# Patient Record
Sex: Female | Born: 1950 | Race: White | Hispanic: Yes | Smoking: Never smoker
Health system: Southern US, Community
[De-identification: ages and names within clinical notes are randomized; demographics above are authoritative.]

## PROBLEM LIST (undated history)

## (undated) DIAGNOSIS — J45909 Unspecified asthma, uncomplicated: Secondary | ICD-10-CM

## (undated) DIAGNOSIS — E785 Hyperlipidemia, unspecified: Secondary | ICD-10-CM

## (undated) DIAGNOSIS — M797 Fibromyalgia: Secondary | ICD-10-CM

## (undated) DIAGNOSIS — K5792 Diverticulitis of intestine, part unspecified, without perforation or abscess without bleeding: Secondary | ICD-10-CM

## (undated) DIAGNOSIS — I2510 Atherosclerotic heart disease of native coronary artery without angina pectoris: Secondary | ICD-10-CM

## (undated) DIAGNOSIS — E119 Type 2 diabetes mellitus without complications: Secondary | ICD-10-CM

## (undated) DIAGNOSIS — I10 Essential (primary) hypertension: Secondary | ICD-10-CM

## (undated) DIAGNOSIS — M1990 Unspecified osteoarthritis, unspecified site: Secondary | ICD-10-CM

## (undated) DIAGNOSIS — R0681 Apnea, not elsewhere classified: Secondary | ICD-10-CM

## (undated) HISTORY — DX: Atherosclerotic heart disease of native coronary artery without angina pectoris: I25.10

## (undated) HISTORY — PX: CHOLECYSTECTOMY: SHX55

## (undated) HISTORY — PX: APPENDECTOMY: SHX54

## (undated) HISTORY — DX: Hyperlipidemia, unspecified: E78.5

## (undated) HISTORY — PX: WISDOM TOOTH EXTRACTION: SHX21

## (undated) HISTORY — PX: BREAST BIOPSY: SHX20

## (undated) HISTORY — PX: HERNIA REPAIR: SHX51

## (undated) HISTORY — PX: ABDOMINAL SURGERY: SHX537

## (undated) HISTORY — PX: SINUS SURGERY WITH INSTATRAK: SHX5215

## (undated) HISTORY — PX: ABDOMINAL HYSTERECTOMY: SHX81

---

## 2016-07-15 ENCOUNTER — Encounter (HOSPITAL_BASED_OUTPATIENT_CLINIC_OR_DEPARTMENT_OTHER): Payer: Self-pay

## 2016-07-15 ENCOUNTER — Emergency Department (HOSPITAL_BASED_OUTPATIENT_CLINIC_OR_DEPARTMENT_OTHER)
Admission: EM | Admit: 2016-07-15 | Discharge: 2016-07-15 | Disposition: A | Discharge status: Home / Self Care | Payer: PRIVATE HEALTH INSURANCE | Attending: Physician Assistant | Admitting: Physician Assistant

## 2016-07-15 ENCOUNTER — Emergency Department (HOSPITAL_BASED_OUTPATIENT_CLINIC_OR_DEPARTMENT_OTHER): Payer: PRIVATE HEALTH INSURANCE

## 2016-07-15 DIAGNOSIS — I10 Essential (primary) hypertension: Secondary | ICD-10-CM | POA: Insufficient documentation

## 2016-07-15 DIAGNOSIS — G500 Trigeminal neuralgia: Secondary | ICD-10-CM | POA: Insufficient documentation

## 2016-07-15 DIAGNOSIS — J45909 Unspecified asthma, uncomplicated: Secondary | ICD-10-CM | POA: Insufficient documentation

## 2016-07-15 DIAGNOSIS — Z7982 Long term (current) use of aspirin: Secondary | ICD-10-CM | POA: Diagnosis not present

## 2016-07-15 DIAGNOSIS — E119 Type 2 diabetes mellitus without complications: Secondary | ICD-10-CM | POA: Insufficient documentation

## 2016-07-15 DIAGNOSIS — Z7984 Long term (current) use of oral hypoglycemic drugs: Secondary | ICD-10-CM | POA: Insufficient documentation

## 2016-07-15 DIAGNOSIS — Z79899 Other long term (current) drug therapy: Secondary | ICD-10-CM | POA: Insufficient documentation

## 2016-07-15 DIAGNOSIS — R51 Headache: Secondary | ICD-10-CM | POA: Diagnosis present

## 2016-07-15 HISTORY — DX: Unspecified osteoarthritis, unspecified site: M19.90

## 2016-07-15 HISTORY — DX: Fibromyalgia: M79.7

## 2016-07-15 HISTORY — DX: Type 2 diabetes mellitus without complications: E11.9

## 2016-07-15 HISTORY — DX: Apnea, not elsewhere classified: R06.81

## 2016-07-15 HISTORY — DX: Essential (primary) hypertension: I10

## 2016-07-15 HISTORY — DX: Unspecified asthma, uncomplicated: J45.909

## 2016-07-15 HISTORY — DX: Diverticulitis of intestine, part unspecified, without perforation or abscess without bleeding: K57.92

## 2016-07-15 MED ORDER — CARBAMAZEPINE ER 100 MG PO TB12: 100.0000 mg | ORAL_TABLET | Freq: Two times a day (BID) | ORAL | 0 refills | Status: DC

## 2016-07-15 NOTE — ED Triage Notes (Signed)
C/o pain to right side of face-started 1 week ago with pain to right upper and lower teeth-NAD-steady gait
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
## 2016-07-15 NOTE — ED Notes (Signed)
Pt denies fever, nasal congestion, or cough

## 2016-07-15 NOTE — Discharge Instructions (Addendum)
We are unsure what is causing your pain today. We think it may be due to trigeminal neuralgia. We've given you information to read about this and the prescription. Please follow-up with a primary care physician as soon as he can.

## 2018-08-10 HISTORY — PX: BARIATRIC SURGERY: SHX1103

## 2018-09-26 ENCOUNTER — Encounter: Payer: Self-pay | Admitting: Internal Medicine

## 2018-09-26 ENCOUNTER — Ambulatory Visit (INDEPENDENT_AMBULATORY_CARE_PROVIDER_SITE_OTHER): Payer: Medicare Other | Admitting: Internal Medicine

## 2018-09-26 VITALS — BP 122/72 | HR 69 | Ht 60.0 in | Wt 214.8 lb

## 2018-09-26 DIAGNOSIS — J4540 Moderate persistent asthma, uncomplicated: Secondary | ICD-10-CM | POA: Diagnosis not present

## 2018-09-26 DIAGNOSIS — R059 Cough, unspecified: Secondary | ICD-10-CM

## 2018-09-26 DIAGNOSIS — R0602 Shortness of breath: Secondary | ICD-10-CM

## 2018-09-26 DIAGNOSIS — R05 Cough: Secondary | ICD-10-CM | POA: Diagnosis not present

## 2018-09-26 DIAGNOSIS — R0609 Other forms of dyspnea: Secondary | ICD-10-CM | POA: Diagnosis not present

## 2018-09-26 DIAGNOSIS — R0982 Postnasal drip: Secondary | ICD-10-CM

## 2018-09-26 LAB — CBC WITH DIFFERENTIAL/PLATELET
Basophils Absolute: 0.1 10*3/uL (ref 0.0–0.1)
Basophils Relative: 0.8 % (ref 0.0–3.0)
EOS PCT: 1.7 % (ref 0.0–5.0)
Eosinophils Absolute: 0.1 10*3/uL (ref 0.0–0.7)
HCT: 41.6 % (ref 36.0–46.0)
HEMOGLOBIN: 13.6 g/dL (ref 12.0–15.0)
Lymphocytes Relative: 25.1 % (ref 12.0–46.0)
Lymphs Abs: 2 10*3/uL (ref 0.7–4.0)
MCHC: 32.7 g/dL (ref 30.0–36.0)
MCV: 88.5 fl (ref 78.0–100.0)
MONO ABS: 0.6 10*3/uL (ref 0.1–1.0)
MONOS PCT: 6.9 % (ref 3.0–12.0)
NEUTROS PCT: 65.5 % (ref 43.0–77.0)
Neutro Abs: 5.3 10*3/uL (ref 1.4–7.7)
Platelets: 270 10*3/uL (ref 150.0–400.0)
RBC: 4.7 Mil/uL (ref 3.87–5.11)
RDW: 15.3 % (ref 11.5–15.5)
WBC: 8.1 10*3/uL (ref 4.0–10.5)

## 2018-09-26 LAB — NITRIC OXIDE: NITRIC OXIDE: 10

## 2018-09-26 MED ORDER — ALBUTEROL SULFATE HFA 108 (90 BASE) MCG/ACT IN AERS: 2.0000 | INHALATION_SPRAY | Freq: Four times a day (QID) | RESPIRATORY_TRACT | 6 refills | Status: DC | PRN

## 2018-09-26 MED ORDER — FLUTICASONE PROPIONATE 50 MCG/ACT NA SUSP: 2.0000 | Freq: Every day | NASAL | 2 refills | Status: DC

## 2018-09-26 NOTE — Patient Instructions (Addendum)
ICD-10-CM   1. Moderate persistent asthma, unspecified whether complicated J45.40   2. Cough R05 CANCELED: CT Chest High Resolution    CANCELED: Pulmonary function test    CANCELED: CBC w/Diff  3. Post-nasal drip R09.82   4. Dyspnea on exertion R06.09     Symptoms currently appear well controlled  Plan  - continue symbicort 160 dose, 2 puff twice daily  -Continue Singulair daily -Change Xopenex to albuterol as needed [Xopenex is extremely expensive in the [place]] -  take generic fluticasone inhaler 2 squirts each nostril daily  -Take prescription for all the above medications -Flu shot high-dose today if you have not had it so far this season  -Do high-resolution CT chest supine and prone -next few to several weeks -Do full pulmonary function test-Next few to several weeks -Do CBC with differential count and blood IgE level -today September 26, 2018  Followup   -Next few to several weeks to review results
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
## 2018-09-27 LAB — IGE: IgE (Immunoglobulin E), Serum: 76 kU/L (ref <=114)

## 2018-10-06 ENCOUNTER — Ambulatory Visit: Admission: RE | Admit: 2018-10-06 | Payer: Medicare Other | Source: Ambulatory Visit

## 2018-10-06 ENCOUNTER — Encounter: Payer: Self-pay | Admitting: Pulmonary Disease

## 2018-10-06 ENCOUNTER — Ambulatory Visit (INDEPENDENT_AMBULATORY_CARE_PROVIDER_SITE_OTHER): Payer: Medicare Other | Admitting: Pulmonary Disease

## 2018-10-06 VITALS — BP 140/80 | HR 72 | Ht 60.0 in | Wt 216.8 lb

## 2018-10-06 DIAGNOSIS — G4733 Obstructive sleep apnea (adult) (pediatric): Secondary | ICD-10-CM

## 2018-10-06 DIAGNOSIS — J4540 Moderate persistent asthma, uncomplicated: Secondary | ICD-10-CM | POA: Diagnosis not present

## 2018-10-06 DIAGNOSIS — Z9989 Dependence on other enabling machines and devices: Secondary | ICD-10-CM | POA: Diagnosis not present

## 2018-10-06 NOTE — Progress Notes (Signed)
Carol Gilbert    [id number]    May 04, 1951  Primary Care Physician:Patient, No Pcp Per  Referring Physician: No referring provider defined for this encounter.  Chief complaint:   Patient with a history of obstructive sleep apnea diagnosed in 2006 Recent bariatric surgery with significant weight loss Intolerance to current pressures  HPI:  She was diagnosed about 2006 and has been on CPAP since then She had bariatric surgery December 11 of 2019 During the process of evaluation and post surgery she has lost over 70 pounds  Pressure feels a little bit too much at present and it starting to affect her sleep quality  She was not having any problems with her CPAP until recently Has been very compliant with CPAP use  Usually tries to go to bed about 10 PM Wakes up a couple of times to use the bathroom, without CPAP usually wakes up almost hourly Final awakening time about 8 AM Has been having headaches Dryness of the mouth since has significant weight loss  Comorbidities include asthma, fibromyalgia, hypertension, diabetes, scoliosis, neuropathy  Does not smoke  Outpatient Encounter Medications as of 10/06/2018  Medication Sig  . albuterol (PROVENTIL HFA;VENTOLIN HFA) 108 (90 Base) MCG/ACT inhaler Inhale 2 puffs into the lungs every 6 (six) hours as needed for wheezing or shortness of breath.  Marland Kitchen atorvastatin (LIPITOR) 10 MG tablet Take by mouth.  . Cholecalciferol (VITAMIN D) 125 MCG (5000 UT) CAPS Take 5,000 Units by mouth daily.   . DULoxetine HCl (CYMBALTA PO) Take by mouth.  . fluticasone (FLONASE) 50 MCG/ACT nasal spray Place 2 sprays into both nostrils daily.  . Guaifenesin 1200 MG TB12 Take by mouth.  . montelukast (SINGULAIR) 10 MG tablet Take 10 mg by mouth at bedtime.  . Multiple Vitamin (MULTIVITAMIN) tablet Take by mouth.  . sennosides-docusate sodium (SENOKOT-S) 8.6-50 MG tablet Take by mouth.  . SYMBICORT 160-4.5 MCG/ACT inhaler INL 2 PFS PO BID   . TRAMADOL HCL ER PO Take by mouth.   No facility-administered encounter medications on file as of 10/06/2018.     Allergies as of 10/06/2018 - Review Complete 10/06/2018  Allergen Reaction Noted  . Shellfish allergy  07/15/2016    Past Medical History:  Diagnosis Date  . Apnea   . Arthritis   . Asthma   . Diabetes mellitus without complication (HCC)   . Diverticulitis   . Fibromyalgia   . Hypertension     Past Surgical History:  Procedure Laterality Date  . ABDOMINAL HYSTERECTOMY    . ABDOMINAL SURGERY    . APPENDECTOMY    . BREAST BIOPSY    . CESAREAN SECTION    . CHOLECYSTECTOMY    . HERNIA REPAIR    . SINUS SURGERY WITH INSTATRAK    . WISDOM TOOTH EXTRACTION      History reviewed. No pertinent family history.  Social History   Socioeconomic History  . Marital status: Divorced    Spouse name: Not on file  . Number of children: Not on file  . Years of education: Not on file  . Highest education level: Not on file  Occupational History  . Not on file  Social Needs  . Financial resource strain: Not on file  . Food insecurity:    Worry: Not on file    Inability: Not on file  . Transportation needs:    Medical: Not on file    Non-medical: Not on file  Tobacco Use  .  Smoking status: Never Smoker  . Smokeless tobacco: Never Used  Substance and Sexual Activity  . Alcohol use: No  . Drug use: No  . Sexual activity: Not on file  Lifestyle  . Physical activity:    Days per week: Not on file    Minutes per session: Not on file  . Stress: Not on file  Relationships  . Social connections:    Talks on phone: Not on file    Gets together: Not on file    Attends religious service: Not on file    Active member of club or organization: Not on file    Attends meetings of clubs or organizations: Not on file    Relationship status: Not on file  . Intimate partner violence:    Fear of current or ex partner: Not on file    Emotionally abused: Not on file     Physically abused: Not on file    Forced sexual activity: Not on file  Other Topics Concern  . Not on file  Social History Narrative  . Not on file    Review of Systems  Constitutional: Negative.   HENT: Negative.   Eyes: Negative.   Respiratory: Negative for cough and shortness of breath.   Cardiovascular: Negative.   Gastrointestinal: Negative.   All other systems reviewed and are negative.   Vitals:   10/06/18 1108  BP: 140/80  Pulse: 72  SpO2: 97%     Physical Exam  Constitutional: She appears well-developed and well-nourished.  HENT:  Head: Normocephalic and atraumatic.  Eyes: Pupils are equal, round, and reactive to light. Conjunctivae and EOM are normal. Right eye exhibits no discharge. Left eye exhibits no discharge.  Neck: Neck supple. No JVD present. No tracheal deviation present. No thyromegaly present.  Cardiovascular: Normal rate and regular rhythm.  Pulmonary/Chest: Effort normal and breath sounds normal. No stridor. No respiratory distress. She has no wheezes. She has no rales. She exhibits no tenderness.  Abdominal: Soft. Bowel sounds are normal. She exhibits no distension. There is no abdominal tenderness.    Results of the Epworth flowsheet 10/06/2018  Sitting and reading 0  Watching TV 0  Sitting, inactive in a public place (e.g. a theatre or a meeting) 0  As a passenger in a car for an hour without a break 1  Lying down to rest in the afternoon when circumstances permit 3  Sitting and talking to someone 0  Sitting quietly after a lunch without alcohol 0  In a car, while stopped for a few minutes in traffic 0  Total score 4   Assessment:  .  Obstructive sleep apnea -Diagnosed in 2006, has been very compliant with treatment -Recent intolerance related to continuing weight loss -Sleep is getting disrupted by dryness and headaches  .  Morbid obesity -Has managed to lose 70 pounds and is continuing to lose weight  .  CPAP intolerance -Related to  continuing weight loss   Plan/Recommendations:  .  We will set the patient up for a home sleep study  .  Pressure intolerance is related to continue weight loss  .  Will benefit from being set up with an auto titrating machine as she continues to lose weight  .  I will see her back in the office in about 3 months Encouraged to continue to work on weight loss efforts Encouraged to call if any significant concerns  Virl Diamond MD [facility] 10/06/2018, 11:18 AM  CC: No  ref. provider found

## 2018-10-06 NOTE — Patient Instructions (Signed)
History of obstructive sleep apnea Recent bariatric surgery with significant weight loss  We will set you up for a home sleep study We will give you a call once we have results from the study and set you up with a medical supply company  I will see you back in the office in about 3 months Call with any significant concerns   Sleep Apnea Sleep apnea is a condition in which breathing pauses or becomes shallow during sleep. Episodes of sleep apnea usually last 10 seconds or longer, and they may occur as many as 20 times an hour. Sleep apnea disrupts your sleep and keeps your body from getting the rest that it needs. This condition can increase your risk of certain health problems, including:  Heart attack.  Stroke.  Obesity.  Diabetes.  Heart failure.  Irregular heartbeat. There are three kinds of sleep apnea:  Obstructive sleep apnea. This kind is caused by a blocked or collapsed airway.  Central sleep apnea. This kind happens when the part of the brain that controls breathing does not send the correct signals to the muscles that control breathing.  Mixed sleep apnea. This is a combination of obstructive and central sleep apnea. What are the causes? The most common cause of this condition is a collapsed or blocked airway. An airway can collapse or become blocked if:  Your throat muscles are abnormally relaxed.  Your tongue and tonsils are larger than normal.  You are overweight.  Your airway is smaller than normal. What increases the risk? This condition is more likely to develop in people who:  Are overweight.  Smoke.  Have a smaller than normal airway.  Are elderly.  Are female.  Drink alcohol.  Take sedatives or tranquilizers.  Have a family history of sleep apnea. What are the signs or symptoms? Symptoms of this condition include:  Trouble staying asleep.  Daytime sleepiness and tiredness.  Irritability.  Loud snoring.  Morning  headaches.  Trouble concentrating.  Forgetfulness.  Decreased interest in sex.  Unexplained sleepiness.  Mood swings.  Personality changes.  Feelings of depression.  Waking up often during the night to urinate.  Dry mouth.  Sore throat. How is this diagnosed? This condition may be diagnosed with:  A medical history.  A physical exam.  A series of tests that are done while you are sleeping (sleep study). These tests are usually done in a sleep lab, but they may also be done at home. How is this treated? Treatment for this condition aims to restore normal breathing and to ease symptoms during sleep. It may involve managing health issues that can affect breathing, such as high blood pressure or obesity. Treatment may include:  Sleeping on your side.  Using a decongestant if you have nasal congestion.  Avoiding the use of depressants, including alcohol, sedatives, and narcotics.  Losing weight if you are overweight.  Making changes to your diet.  Quitting smoking.  Using a device to open your airway while you sleep, such as: ? An oral appliance. This is a custom-made mouthpiece that shifts your lower jaw forward. ? A continuous positive airway pressure (CPAP) device. This device delivers oxygen to your airway through a mask. ? A nasal expiratory positive airway pressure (EPAP) device. This device has valves that you put into each nostril. ? A bi-level positive airway pressure (BPAP) device. This device delivers oxygen to your airway through a mask.  Surgery if other treatments do not work. During surgery, excess tissue is removed  to create a wider airway. It is important to get treatment for sleep apnea. Without treatment, this condition can lead to:  High blood pressure.  Coronary artery disease.  (Men) An inability to achieve or maintain an erection (impotence).  Reduced thinking abilities. Follow these instructions at home:  Make any lifestyle changes that  your health care provider recommends.  Eat a healthy, well-balanced diet.  Take over-the-counter and prescription medicines only as told by your health care provider.  Avoid using depressants, including alcohol, sedatives, and narcotics.  Take steps to lose weight if you are overweight.  If you were given a device to open your airway while you sleep, use it only as told by your health care provider.  Do not use any tobacco products, such as cigarettes, chewing tobacco, and e-cigarettes. If you need help quitting, ask your health care provider.  Keep all follow-up visits as told by your health care provider. This is important. Contact a health care provider if:  The device that you received to open your airway during sleep is uncomfortable or does not seem to be working.  Your symptoms do not improve.  Your symptoms get worse. Get help right away if:  You develop chest pain.  You develop shortness of breath.  You develop discomfort in your back, arms, or stomach.  You have trouble speaking.  You have weakness on one side of your body.  You have drooping in your face. These symptoms may represent a serious problem that is an emergency. Do not wait to see if the symptoms will go away. Get medical help right away. Call your local emergency services (911 in the [place] Do not drive yourself to the hospital. This information is not intended to replace advice given to you by your health care provider. Make sure you discuss any questions you have with your health care provider. Document Released: 08/07/2002 Document Revised: 03/15/2017 Document Reviewed: 05/27/2015 Elsevier Interactive Patient Education  2019 [employer].

## 2018-10-24 ENCOUNTER — Ambulatory Visit (INDEPENDENT_AMBULATORY_CARE_PROVIDER_SITE_OTHER)
Admission: RE | Admit: 2018-10-24 | Discharge: 2018-10-24 | Disposition: A | Discharge status: Home / Self Care | Payer: Medicare Other | Source: Ambulatory Visit | Attending: Internal Medicine | Admitting: Internal Medicine

## 2018-10-24 DIAGNOSIS — R0602 Shortness of breath: Secondary | ICD-10-CM

## 2018-10-25 DIAGNOSIS — Z9989 Dependence on other enabling machines and devices: Principal | ICD-10-CM

## 2018-10-25 DIAGNOSIS — G4733 Obstructive sleep apnea (adult) (pediatric): Secondary | ICD-10-CM | POA: Diagnosis not present

## 2018-10-28 DIAGNOSIS — G4733 Obstructive sleep apnea (adult) (pediatric): Secondary | ICD-10-CM

## 2018-11-01 ENCOUNTER — Ambulatory Visit (INDEPENDENT_AMBULATORY_CARE_PROVIDER_SITE_OTHER): Payer: Medicare Other | Admitting: Internal Medicine

## 2018-11-01 ENCOUNTER — Telehealth: Payer: Self-pay | Admitting: Internal Medicine

## 2018-11-01 ENCOUNTER — Telehealth: Payer: Self-pay | Admitting: Pulmonary Disease

## 2018-11-01 ENCOUNTER — Encounter: Payer: Self-pay | Admitting: Internal Medicine

## 2018-11-01 VITALS — BP 128/72 | HR 69 | Ht 61.0 in | Wt 206.0 lb

## 2018-11-01 DIAGNOSIS — R0982 Postnasal drip: Secondary | ICD-10-CM | POA: Diagnosis not present

## 2018-11-01 DIAGNOSIS — J4540 Moderate persistent asthma, uncomplicated: Secondary | ICD-10-CM | POA: Diagnosis not present

## 2018-11-01 DIAGNOSIS — Z9989 Dependence on other enabling machines and devices: Secondary | ICD-10-CM

## 2018-11-01 DIAGNOSIS — G4733 Obstructive sleep apnea (adult) (pediatric): Secondary | ICD-10-CM | POA: Diagnosis not present

## 2018-11-01 DIAGNOSIS — R911 Solitary pulmonary nodule: Secondary | ICD-10-CM

## 2018-11-01 LAB — PULMONARY FUNCTION TEST
DL/VA % pred: 135 %
DL/VA: 5.78 ml/min/mmHg/L
DLCO UNC: 24.98 ml/min/mmHg
DLCO cor % pred: 139 %
DLCO cor: 24.83 ml/min/mmHg
DLCO unc % pred: 140 %
FEF 25-75 POST: 1.12 L/s
FEF 25-75 Pre: 1.25 L/sec
FEF2575-%Change-Post: -10 %
FEF2575-%PRED-PRE: 65 %
FEF2575-%Pred-Post: 58 %
FEV1-%Change-Post: -2 %
FEV1-%Pred-Post: 79 %
FEV1-%Pred-Pre: 81 %
FEV1-POST: 1.67 L
FEV1-Pre: 1.72 L
FEV1FVC-%CHANGE-POST: -3 %
FEV1FVC-%PRED-PRE: 94 %
FEV6-%CHANGE-POST: 1 %
FEV6-%PRED-PRE: 88 %
FEV6-%Pred-Post: 89 %
FEV6-Post: 2.38 L
FEV6-Pre: 2.35 L
FEV6FVC-%Pred-Post: 104 %
FEV6FVC-%Pred-Pre: 104 %
FVC-%CHANGE-POST: 0 %
FVC-%PRED-POST: 85 %
FVC-%Pred-Pre: 85 %
FVC-Post: 2.38 L
FVC-Pre: 2.36 L
POST FEV6/FVC RATIO: 100 %
PRE FEV1/FVC RATIO: 73 %
Post FEV1/FVC ratio: 70 %
Pre FEV6/FVC Ratio: 100 %

## 2018-11-01 NOTE — Patient Instructions (Addendum)
ICD-10-CM   1. Moderate persistent asthma, unspecified whether complicated J45.40   2. Post-nasal drip R09.82   3. OSA on CPAP G47.33    Z99.89    Moderate persistent asthma, unspecified whether complicated  -Stable and well controlled. -I would classify as moderate persistent asthma with the Symbicort and Singulair giving you normal lung function. -No evidence of high eosinophil of blood allergy IgE marker -indicating overall good prognosis -Monitor for flareups - Please talk to PCP Patient, No Pcp Per -  and ensure you get  shingrix (GSK) inactivated vaccine against shingles -Always get your flu shot every season and pneumonia shot per schedule  Post-nasal drip  -Continue nasal steroid and nasal wash  OSA on CPAP -Your next appointment Dr. Aldean Ast is Jan 06, 2019.    Followu[ - Dr Marchelle Gearing 6 months or sooner

## 2018-11-01 NOTE — Telephone Encounter (Signed)
Pt seen for a follow up with Dr. Marchelle Gearing. At the visit, pt stated to MR that she had sleep consult with Dr. Wynona Neat and had sleep study performed around 10/27/2018.   Pt is wanting to know the results of HST as soon as we have them. Per MR, send phone note in regards to this. Dr. Wynona Neat, please advise on results.   Sending to both [facility] and Dr. Wynona Neat to follow up on.

## 2018-11-01 NOTE — Telephone Encounter (Signed)
CT chest  - forgot to give these results in clinic and my sincere apologes   -> No cancer, No fibrosis, No pneumnia. No emphysema  -> 24mm RLL nodule -> followup needed 6 months  - aorta 4cm - needs annual CT which we can capture with nodule followup  gavie resutls to her - called her back to clinic  PLAN Irving Burton - please get ct chest without contrast at folowup in 6 months  Thanks  MR    IMPRESSION:10/24/2018 1. No evidence of interstitial lung disease. 2. Mild patchy air trapping in both lungs, indicative of small airways disease. 3. Subsolid 7 mm right lower lobe pulmonary nodule. Follow-up non-contrast CT recommended at 3-6 months to confirm persistence. If unchanged, and solid component remains <6 mm, annual CT is recommended until 5 years of stability has been established. If persistent these nodules should be considered highly suspicious if the solid component of the nodule is 6 mm or greater in size and enlarging. This recommendation follows the consensus statement: Guidelines for Management of Incidental Pulmonary Nodules Detected on CT Images: From the Fleischner Society 2017; Radiology 2017; 284:228-243. 4. Left adrenal adenoma. 5. Ectatic 4.0 cm ascending thoracic aorta. Recommend annual imaging followup by CTA or MRA. This recommendation follows 2010 ACCF/AHA/AATS/ACR/ASA/SCA/SCAI/SIR/STS/SVM Guidelines for the Diagnosis and Management of Patients with Thoracic Aortic Disease. Circulation. 2010; 121: N277-O242. Aortic aneurysm NOS (ICD10-I71.9).  Aortic Atherosclerosis (ICD10-I70.0).

## 2018-11-01 NOTE — Telephone Encounter (Signed)
Order has been placed for the CT. Nothing further needed. 

## 2018-11-01 NOTE — Progress Notes (Signed)
PFT done today. 
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
## 2018-11-04 NOTE — Telephone Encounter (Signed)
Dr. Wynona Neat has reviewed the home sleep test this showed MILD osa .   Recommendations  Sleep position optimization by encouraging sleep in the lateral poittion , eveating the head of the bed by 30 degrees may help. Cpap therapy maybe consider if the patient has significant daytime sympotoms  That can be ascribed to  An effect of sleep disordered breathing    Treatment options maybe CPAP with the settings auto 5 to 15.          Regular exercise will help promote good quality sleep  .    Advise against driving while sleepy & against medication with sedative side effects.      Make appointment for 3 months for compliance with download with Dr. Wynona Neat. If patient choose a cpap therapy. Other wise as needed.  Patient would like to try sleep postioning and weight loss first before cpap. Will call back for apt.

## 2019-01-06 ENCOUNTER — Ambulatory Visit: Payer: Medicare Other | Admitting: Pulmonary Disease

## 2019-02-16 ENCOUNTER — Telehealth: Payer: Self-pay | Admitting: Internal Medicine

## 2019-02-16 NOTE — Telephone Encounter (Signed)
Called and spoke with pt letting her know that we received the information that surgical clearance was needed. Asked her if I could go ahead and schedule her an appt and pt stated that was fine. Surgical clearance appt scheduled for pt tomorrow, 6/19 at 11:30 with Lazaro Arms. I did place the surgical clearance form at Dana's workstation since she is the one working with [place]. Nothing further needed.

## 2019-02-17 ENCOUNTER — Other Ambulatory Visit: Payer: Self-pay

## 2019-02-17 ENCOUNTER — Ambulatory Visit (INDEPENDENT_AMBULATORY_CARE_PROVIDER_SITE_OTHER): Payer: Medicare Other | Admitting: Nurse Practitioner

## 2019-02-17 ENCOUNTER — Telehealth: Payer: Self-pay | Admitting: General Surgery

## 2019-02-17 ENCOUNTER — Other Ambulatory Visit: Payer: Self-pay | Admitting: Orthopedic Surgery

## 2019-02-17 ENCOUNTER — Encounter: Payer: Self-pay | Admitting: Nurse Practitioner

## 2019-02-17 DIAGNOSIS — J45909 Unspecified asthma, uncomplicated: Secondary | ICD-10-CM

## 2019-02-17 NOTE — Assessment & Plan Note (Signed)
Surgical Clearance: Patient has been stable over the past year. Patient is considered low to moderate risk from pulmonary standpoint.   Major Pulmonary risks identified in the multifactorial risk analysis are but not limited to a) pneumonia; b) recurrent intubation risk; c) prolonged or recurrent acute respiratory failure needing mechanical ventilation; d) prolonged hospitalization; e) DVT/Pulmonary embolism; f) Acute Pulmonary edema  Recommend 1. Short duration of surgery as much as possible and avoid paralytic if possible 2. Recovery in step down or ICU with Pulmonary consultation if indicated 3. DVT prophylaxis 4. Aggressive pulmonary toilet with O2, bronchodilatation, and incentive spirometry and early ambulation   Asthma: Patient presents today for a surgical clearance.  She is scheduled to have a left total knee arthroplasty.   She is compliant with Symbicort and Singulair.  She has had no recent flares.  She denies any recent significant shortness of breath or wheezing.  Denies any significant cough.  Does complain of some postnasal drip satiated with allergies.  She does use Flonase.  Overall she has been stable.  OSA: Patient is followed by our office for mild OSA and asthma.  Patient states that she is no longer on CPAP after recent home sleep study showed mild OSA.

## 2019-02-17 NOTE — Progress Notes (Signed)
 @Patient  ID: Carol Gilbert, female    DOB: 1951-07-19, 68 y.o.   MRN: [id number]  Chief Complaint  Patient presents with  . Surgical Clearance    Referring provider: No ref. provider found  HPI 68 year old female never smoker with moderate persistent asthma, mild OSA, allergies who is followed by Dr. Marchelle Gearingamaswamy. Patient is followed by Dr. Wynona Neatlalere for sleep.   Tests:  HRCT 10/24/18 - No evidence of interstitial lung disease. Mild patchy air trapping in both lungs, indicative of small airways disease. Subsolid 7 mm right lower lobe pulmonary nodule. Follow-up non-contrast CT recommended at 3-6 months to confirm persistence.  HST 10/25/18 - AHI 6.8, SpO2 low 87%  PFT Results Latest Ref Rng & Units 11/01/2018  FVC-Pre L 2.36  FVC-Predicted Pre % 85  FVC-Post L 2.38  FVC-Predicted Post % 85  Pre FEV1/FVC % % 73  Post FEV1/FCV % % 70  FEV1-Pre L 1.72  FEV1-Predicted Pre % 81  FEV1-Post L 1.67  DLCO UNC% % 140  DLCO COR %Predicted % 135    OV 02/17/19 - surgical clearance  Patient presents today for a surgical clearance.  She is scheduled to have a left total knee arthroplasty.  Patient is followed by our office for mild OSA and asthma.  Patient states that she is no longer on CPAP after recent home sleep study showed mild OSA.  She is compliant with Symbicort and Singulair.  She has had no recent flares.  She denies any recent significant shortness of breath or wheezing.  Denies any significant cough.  Does complain of some postnasal drip satiated with allergies.  She does use Flonase.  Overall she has been stable. Denies f/c/s, n/v/d, hemoptysis, PND, leg swelling.    [place] Postperative Pulmonary Risk Score - for mech ventilation dependence >3-5 days Comment Score  Type of surgery - abd ao aneurysm (27), thoracic (21), neurosurgery / upper abdominal / vascular (21), neck (11) Left total knee arthroplasty 0  Emergency Surgery - (11)  0  ALbumin < 3 or poor nutritional  state - (9) No recent lab 0  BUN > 30 -  (8) No recent lab 0  Partial or completely dependent functional status - (7)  0  COPD -  (6)  0  Age - 60 to 3569 (4), > 70  (6)  4  TOTAL    Risk Stratifcation scores  - < 10, 11-19, 20-27, 28-40, >40  4     CANET Postperative Pulmonary Risk Score -for any pulm complication Comment Score  Age - <50 (0), 50-80 (3), >80 (16)  3  Preoperative pulse ox - >96 (0), 91-95 (8), <90 (24)  0  Respiratory infection in last month - Yes (17)  0  Preoperative anemia - < 10gm% - Yes (11)  0  Surgical incision - Upper abdominal (15), Thoracic (24)  0  Duration of surgery - <2h (0), 2-3h (16), >3h (23)  0  Emergency Surgery - Yes (8)  0  TOTAL    Risk Stratification - Low (<26), Intermediate (26-44), High (>45)  3      Allergies  Allergen Reactions  . Shellfish Allergy     Immunization History  Administered Date(s) Administered  . Influenza, High Dose Seasonal PF 05/06/2018  . Influenza, Seasonal, Injecte, Preservative Fre 05/18/2017  . Pneumococcal Conjugate-13 08/25/2018  . Pneumococcal Polysaccharide-23 08/02/2017    Past Medical History:  Diagnosis Date  . Apnea   . Arthritis   . Asthma   .  Diabetes mellitus without complication ([place])   . Diverticulitis   . Fibromyalgia   . Hypertension     Tobacco History: Social History   Tobacco Use  Smoking Status Never Smoker  Smokeless Tobacco Never Used   Counseling given: Yes   Outpatient Encounter Medications as of 02/17/2019  Medication Sig  . albuterol (PROVENTIL HFA;VENTOLIN HFA) 108 (90 Base) MCG/ACT inhaler Inhale 2 puffs into the lungs every 6 (six) hours as needed for wheezing or shortness of breath.  . Ascorbic Acid (VITAMIN C) 1000 MG tablet Take 1,000 mg by mouth daily.  Marland Kitchen b complex vitamins tablet Take 1 tablet by mouth daily.  Marland Kitchen BIOTIN PO Take by mouth.  . calcium citrate (CALCITRATE - DOSED IN MG ELEMENTAL CALCIUM) 950 MG tablet Take 200 mg of elemental calcium by mouth  daily.  . Cholecalciferol (VITAMIN D) 125 MCG (5000 UT) CAPS Take 5,000 Units by mouth daily.   Mariane Baumgarten Calcium (STOOL SOFTENER PO) Take by mouth as needed.  . DULoxetine HCl (CYMBALTA PO) Take by mouth.  . fluticasone (FLONASE) 50 MCG/ACT nasal spray Place 2 sprays into both nostrils daily.  . Guaifenesin 1200 MG TB12 Take by mouth.  . Homeopathic Products (ALLERGY MEDICINE PO) Take by mouth daily. Allertec  . Methylcellulose, Laxative, (CITRUCEL PO) Take 3 capsules by mouth at bedtime.  . montelukast (SINGULAIR) 10 MG tablet Take 10 mg by mouth at bedtime.  . Multiple Vitamin (MULTIVITAMIN) tablet Take by mouth.  . sennosides-docusate sodium (SENOKOT-S) 8.6-50 MG tablet Take by mouth.  . SYMBICORT 160-4.5 MCG/ACT inhaler INL 2 PFS PO BID  . calcium carbonate (TUMS - DOSED IN MG ELEMENTAL CALCIUM) 500 MG chewable tablet Chew 6 tablets by mouth daily.  Marland Kitchen omeprazole (PRILOSEC) 40 MG capsule   . senna (SENOKOT) 8.6 MG tablet Take 1 tablet by mouth daily.  . TRAMADOL HCL ER PO Take by mouth.  . [DISCONTINUED] atorvastatin (LIPITOR) 10 MG tablet Take by mouth.   No facility-administered encounter medications on file as of 02/17/2019.      Review of Systems  Review of Systems  Constitutional: Negative.  Negative for chills and fever.  HENT: Negative.   Respiratory: Negative for cough, shortness of breath and wheezing.   Cardiovascular: Negative.  Negative for chest pain, palpitations and leg swelling.  Gastrointestinal: Negative.   Allergic/Immunologic: Negative.   Neurological: Negative.   Psychiatric/Behavioral: Negative.        Physical Exam  BP 132/70 (BP Location: Left Arm, Patient Position: Sitting, Cuff Size: Large)   Pulse 69   Temp 98.2 F (36.8 C)   Ht 5\' 1"  (1.549 m)   Wt 199 lb 6.4 oz (90.4 kg)   SpO2 96%   BMI 37.68 kg/m   Wt Readings from Last 5 Encounters:  02/17/19 199 lb 6.4 oz (90.4 kg)  11/01/18 206 lb (93.4 kg)  10/06/18 216 lb 12.8 oz (98.3 kg)   09/26/18 214 lb 12.8 oz (97.4 kg)  07/15/16 279 lb (126.6 kg)     Physical Exam Vitals signs and nursing note reviewed.  Constitutional:      General: She is not in acute distress.    Appearance: She is well-developed.  Cardiovascular:     Rate and Rhythm: Normal rate and regular rhythm.  Pulmonary:     Effort: Pulmonary effort is normal. No respiratory distress.     Breath sounds: No wheezing or rhonchi.  Musculoskeletal:        General: No swelling.  Neurological:  Mental Status: She is alert and oriented to person, place, and time.        Assessment & Plan:   Asthma Surgical Clearance: Patient has been stable over the past year. Patient is considered low to moderate risk from pulmonary standpoint.   Major Pulmonary risks identified in the multifactorial risk analysis are but not limited to a) pneumonia; b) recurrent intubation risk; c) prolonged or recurrent acute respiratory failure needing mechanical ventilation; d) prolonged hospitalization; e) DVT/Pulmonary embolism; f) Acute Pulmonary edema  Recommend 1. Short duration of surgery as much as possible and avoid paralytic if possible 2. Recovery in step down or ICU with Pulmonary consultation if indicated 3. DVT prophylaxis 4. Aggressive pulmonary toilet with O2, bronchodilatation, and incentive spirometry and early ambulation   Asthma: Patient presents today for a surgical clearance.  She is scheduled to have a left total knee arthroplasty.   She is compliant with Symbicort and Singulair.  She has had no recent flares.  She denies any recent significant shortness of breath or wheezing.  Denies any significant cough.  Does complain of some postnasal drip satiated with allergies.  She does use Flonase.  Overall she has been stable.  OSA: Patient is followed by our office for mild OSA and asthma.  Patient states that she is no longer on CPAP after recent home sleep study showed mild OSA.     Carol Andrewonya S Yana Schorr, NP  02/17/2019

## 2019-02-17 NOTE — Patient Instructions (Signed)
Continue Symbicort Continue ventolin as needed  Continue Singulair  Will fax office note and pre-operative risk assessment to orthopedic office.   Follow up with Dr. Chase Caller in 3 months or sooner if needed

## 2019-02-20 NOTE — Telephone Encounter (Signed)
Copy of the surgical clearance was mailed to patient for her records, per conversation with her during her OV. Nothing further needed at this time.

## 2019-03-02 ENCOUNTER — Telehealth: Payer: Self-pay | Admitting: Nurse Practitioner

## 2019-03-02 NOTE — Telephone Encounter (Signed)
Spoke with Wells Guiles. Advised her that the patient was seen on 6/19 and cleared from a pulmonary standpoint. Requested that she resend the clearance letter as the original was probably sent to scan after it was faxed. She will fax it to the triage fax.   Will keep this encounter open to make sure that it has been received.

## 2019-03-02 NOTE — Telephone Encounter (Signed)
Carol Gilbert is returning call. Per Carol Gilbert, she has not received the surgical clearance forms or OV Notes. Requesting these be resent.  Fax Number- [phone] [name] Cb- [phone]

## 2019-03-02 NOTE — Telephone Encounter (Signed)
Fax has been received. Filled out form to the best of my ability. Will have Tonya to sign on Monday and then fax back to [place].

## 2019-03-02 NOTE — Telephone Encounter (Signed)
Attempted to call Wells Guiles with [facility] but unable to reach. Left message for Wells Guiles to return call. Per phone encounter 6/19 after pt had surgical clearance visit with [place], Hinton Dyer had called Wells Guiles to confirm that she received the completed surgical clearance form and OV notes from 6/19.  Will await a return call from Wells Guiles in regards to this.

## 2019-03-13 ENCOUNTER — Telehealth: Payer: Self-pay | Admitting: Nurse Practitioner

## 2019-03-13 NOTE — Telephone Encounter (Signed)
Per encounter from 7/2, it was documented by Benetta Spar, CMA that fax for the surgical clearance form was received.  Tonya, please advise if you filled out the form that needed to be faxed back to Victor with [facility]. Thanks!

## 2019-03-14 NOTE — Telephone Encounter (Signed)
Yes. It looks like this was completed. Thanks.

## 2019-03-14 NOTE — Telephone Encounter (Signed)
Attempted to call Wells Guiles but unable to reach. Left message for her to return call.

## 2019-03-14 NOTE — Telephone Encounter (Signed)
Checked with Hinton Dyer to see if she had the surgical clearance form and per Hinton Dyer, the day of pt's OV 02/17/2019 the form was filled out and faxed back to [place]. Hinton Dyer stated that she called the office once it was faxed and got confirmation from staff at [facility] that they did receive the form.  Hinton Dyer also stated that she mailed a copy of the form to pt so she would have it as well.  Attempted to call Wells Guiles with [facility] but unable to reach. Left Wells Guiles a message to return call.

## 2019-03-14 NOTE — Telephone Encounter (Signed)
Wells Guiles from [facility] returning call for Carol Gilbert - CB# [phone]

## 2019-03-15 NOTE — Telephone Encounter (Signed)
Called [facility] and spoke with [place]. Stated to Wells Guiles that we had faxed the surgical clearance form to office after pt's OV 6/19. Per Wells Guiles, she still has not received anything. I stated to her that after it was faxed, we called and spoke with office and was told that they had received it but unsure who was spoken to. Also stated to Wells Guiles that a copy was mailed to pt's address so pt should have form too.  Wells Guiles wanted to know if pt's OV had any info in regards to surgical clearance and I read Wells Guiles info that was stated in [facility] note. Wells Guiles wanted to know if the OV could be faxed to her and I stated to her I would. Verified fax number that info needed to be sent to and have faxed it in Rebecca's attn. Nothing further needed.

## 2019-03-29 ENCOUNTER — Other Ambulatory Visit: Payer: Self-pay | Admitting: Orthopedic Surgery

## 2019-03-29 NOTE — Care Plan (Signed)
Spoke with patient prior to surgery. She will discharge to home. She has a son and daughter locally that can assist some but she does live alone. I have ordered a rolling walker and a 3n1 for home use. HHPT referral to [facility] at [facility]. Patient and MD agreeable with plan. Choice offered.   Ladell Heads, [place]

## 2019-03-29 NOTE — Patient Instructions (Addendum)
YOU HAVE HAD A COVID 19 TEST. PLEASE CONTINUE THE QUARANTINE INSTRUCTIONS AS OUTLINED IN YOUR HANDOUT.                Carol Gilbert  03/29/2019   Your procedure is scheduled on: 04-03-19    [facility]  [facility]    Report to Admitting at 9:45 AM   1 VISITOR IS ALLOWED TO WAIT IN WAITING ROOM  ONLY DAY OF YOUR SURGERY.    Call this number if you have problems the morning of surgery [phone]    Remember: After Midnight                        NOTHING BY MOUTH EXCEPT CLEAR LIQUIDS. PLEASE FINISH G2 DRINK PER SURGEON ORDER AT 9:15 AM.   CLEAR LIQUID DIET   Foods Allowed                                                                     Foods Excluded  Coffee and tea, regular and decaf                             liquids that you cannot  Plain Jell-O any favor except red or purple                                           see through such as: Fruit ices (not with fruit pulp)                                     milk, soups, orange juice  Iced Popsicles                                    All solid food Carbonated beverages, regular and diet                                    Cranberry, grape and apple juices Sports drinks like Gatorade Lightly seasoned clear broth or consume(fat free) Sugar, honey syrup  Sample Menu Breakfast                                Lunch                                     Supper Cranberry juice                    Beef broth                            Chicken broth Jell-O  Grape juice                           Apple juice Coffee or tea                        Jell-O                                      Popsicle                                                Coffee or tea                        Coffee or tea  _____________________________________________________________________    Take these medicines the morning of surgery with A SIP OF WATER: Cetrizine, Duloxetine (Cymbalta), and  Singulair.  You may also use and bring your inhaler, and nasal spray as needed.    BRUSH YOUR TEETH MORNING OF SURGERY AND RINSE YOUR MOUTH OUT, NO CHEWING GUM CANDY OR MINTS.                You may not have any metal on your body including hair pins and piercings     Do not wear jewelry, make-up, lotions, powders or perfumes, deodorant              Do not wear nail polish.  Do not shave  48 hours prior to surgery.                Do not bring valuables to the hospital. [name].  Contacts, dentures or bridgework may not be worn into surgery.  :  Special Instructions: N/A              Please read over the following fact sheets you were given: _____________________________________________________________________             [facility] - Preparing for Surgery Before surgery, you can play an important role.  Because skin is not sterile, your skin needs to be as free of germs as possible.  You can reduce the number of germs on your skin by washing with CHG (chlorahexidine gluconate) soap before surgery.  CHG is an antiseptic cleaner which kills germs and bonds with the skin to continue killing germs even after washing. Please DO NOT use if you have an allergy to CHG or antibacterial soaps.  If your skin becomes reddened/irritated stop using the CHG and inform your nurse when you arrive at Short Stay. Do not shave (including legs and underarms) for at least 48 hours prior to the first CHG shower.  You may shave your face/neck. Please follow these instructions carefully:  1.  Shower with CHG Soap the night before surgery and the  morning of Surgery.  2.  If you choose to wash your hair, wash your hair first as usual with your  normal  shampoo.  3.  After you shampoo, rinse your hair and body thoroughly to remove the  shampoo.  4.  Use CHG as you would any other liquid soap.  You can apply chg directly  to the skin and  wash                       Gently with a scrungie or clean washcloth.  5.  Apply the CHG Soap to your body ONLY FROM THE NECK DOWN.   Do not use on face/ open                           Wound or open sores. Avoid contact with eyes, ears mouth and genitals (private parts).                       Wash face,  Genitals (private parts) with your normal soap.             6.  Wash thoroughly, paying special attention to the area where your surgery  will be performed.  7.  Thoroughly rinse your body with warm water from the neck down.  8.  DO NOT shower/wash with your normal soap after using and rinsing off  the CHG Soap.                9.  Pat yourself dry with a clean towel.            10.  Wear clean pajamas.            11.  Place clean sheets on your bed the night of your first shower and do not  sleep with pets. Day of Surgery : Do not apply any lotions/deodorants the morning of surgery.  Please wear clean clothes to the hospital/surgery center.  FAILURE TO FOLLOW THESE INSTRUCTIONS MAY RESULT IN THE CANCELLATION OF YOUR SURGERY PATIENT SIGNATURE_________________________________  NURSE SIGNATURE__________________________________  ________________________________________________________________________   [name]  An incentive spirometer is a tool that can help keep your lungs clear and active. This tool measures how well you are filling your lungs with each breath. Taking long deep breaths may help reverse or decrease the chance of developing breathing (pulmonary) problems (especially infection) following:  A long period of time when you are unable to move or be active. BEFORE THE PROCEDURE   If the spirometer includes an indicator to show your best effort, your nurse or respiratory therapist will set it to a desired goal.  If possible, sit up straight or lean slightly forward. Try not to slouch.  Hold the incentive spirometer in an upright position. INSTRUCTIONS FOR USE   1. Sit on the edge of your bed if possible, or sit up as far as you can in bed or on a chair. 2. Hold the incentive spirometer in an upright position. 3. Breathe out normally. 4. Place the mouthpiece in your mouth and seal your lips tightly around it. 5. Breathe in slowly and as deeply as possible, raising the piston or the ball toward the top of the column. 6. Hold your breath for 3-5 seconds or for as long as possible. Allow the piston or ball to fall to the bottom of the column. 7. Remove the mouthpiece from your mouth and breathe out normally. 8. Rest for a few seconds and repeat Steps 1 through 7 at least 10 times every 1-2 hours when you are awake. Take your time and take a few normal breaths between deep breaths. 9. The spirometer may include an indicator to  show your best effort. Use the indicator as a goal to work toward during each repetition. 10. After each set of 10 deep breaths, practice coughing to be sure your lungs are clear. If you have an incision (the cut made at the time of surgery), support your incision when coughing by placing a pillow or rolled up towels firmly against it. Once you are able to get out of bed, walk around indoors and cough well. You may stop using the incentive spirometer when instructed by your caregiver.  RISKS AND COMPLICATIONS  Take your time so you do not get dizzy or light-headed.  If you are in pain, you may need to take or ask for pain medication before doing incentive spirometry. It is harder to take a deep breath if you are having pain. AFTER USE  Rest and breathe slowly and easily.  It can be helpful to keep track of a log of your progress. Your caregiver can provide you with a simple table to help with this. If you are using the spirometer at home, follow these instructions: [place] IF:   You are having difficultly using the spirometer.  You have trouble using the spirometer as often as instructed.  Your pain medication is  not giving enough relief while using the spirometer.  You develop fever of 100.5 F (38.1 C) or higher. SEEK IMMEDIATE MEDICAL CARE IF:   You cough up bloody sputum that had not been present before.  You develop fever of 102 F (38.9 C) or greater.  You develop worsening pain at or near the incision site. MAKE SURE YOU:   Understand these instructions.  Will watch your condition.  Will get help right away if you are not doing well or get worse. Document Released: 12/28/2006 Document Revised: 11/09/2011 Document Reviewed: 02/28/2007 ExitCare Patient Information 2014 ExitCare, [place].   ________________________________________________________________________  WHAT IS A BLOOD TRANSFUSION? Blood Transfusion Information  A transfusion is the replacement of blood or some of its parts. Blood is made up of multiple cells which provide different functions.  Red blood cells carry oxygen and are used for blood loss replacement.  White blood cells fight against infection.  Platelets control bleeding.  Plasma helps clot blood.  Other blood products are available for specialized needs, such as hemophilia or other clotting disorders. BEFORE THE TRANSFUSION  Who gives blood for transfusions?   Healthy volunteers who are fully evaluated to make sure their blood is safe. This is blood bank blood. Transfusion therapy is the safest it has ever been in the practice of medicine. Before blood is taken from a donor, a complete history is taken to make sure that person has no history of diseases nor engages in risky social behavior (examples are intravenous drug use or sexual activity with multiple partners). The donor's travel history is screened to minimize risk of transmitting infections, such as malaria. The donated blood is tested for signs of infectious diseases, such as HIV and hepatitis. The blood is then tested to be sure it is compatible with you in order to minimize the chance of a  transfusion reaction. If you or a relative donates blood, this is often done in anticipation of surgery and is not appropriate for emergency situations. It takes many days to process the donated blood. RISKS AND COMPLICATIONS Although transfusion therapy is very safe and saves many lives, the main dangers of transfusion include:   Getting an infectious disease.  Developing a transfusion reaction. This is an allergic reaction  to something in the blood you were given. Every precaution is taken to prevent this. The decision to have a blood transfusion has been considered carefully by your caregiver before blood is given. Blood is not given unless the benefits outweigh the risks. AFTER THE TRANSFUSION  Right after receiving a blood transfusion, you will usually feel much better and more energetic. This is especially true if your red blood cells have gotten low (anemic). The transfusion raises the level of the red blood cells which carry oxygen, and this usually causes an energy increase.  The nurse administering the transfusion will monitor you carefully for complications. HOME CARE INSTRUCTIONS  No special instructions are needed after a transfusion. You may find your energy is better. Speak with your caregiver about any limitations on activity for underlying diseases you may have. SEEK MEDICAL CARE IF:   Your condition is not improving after your transfusion.  You develop redness or irritation at the intravenous (IV) site. SEEK IMMEDIATE MEDICAL CARE IF:  Any of the following symptoms occur over the next 12 hours:  Shaking chills.  You have a temperature by mouth above 102 F (38.9 C), not controlled by medicine.  Chest, back, or muscle pain.  People around you feel you are not acting correctly or are confused.  Shortness of breath or difficulty breathing.  Dizziness and fainting.  You get a rash or develop hives.  You have a decrease in urine output.  Your urine turns a dark  color or changes to pink, red, or brown. Any of the following symptoms occur over the next 10 days:  You have a temperature by mouth above 102 F (38.9 C), not controlled by medicine.  Shortness of breath.  Weakness after normal activity.  The white part of the eye turns yellow (jaundice).  You have a decrease in the amount of urine or are urinating less often.  Your urine turns a dark color or changes to pink, red, or brown. Document Released: 08/14/2000 Document Revised: 11/09/2011 Document Reviewed: 04/02/2008 [facility] Patient Information 2014 [place], [place].  _______________________________________________________________________

## 2019-03-30 ENCOUNTER — Other Ambulatory Visit (HOSPITAL_COMMUNITY)
Admission: RE | Admit: 2019-03-30 | Discharge: 2019-03-30 | Disposition: A | Discharge status: Home / Self Care | Payer: Medicare Other | Source: Ambulatory Visit | Attending: Orthopedic Surgery | Admitting: Orthopedic Surgery

## 2019-03-30 DIAGNOSIS — Z20828 Contact with and (suspected) exposure to other viral communicable diseases: Secondary | ICD-10-CM | POA: Diagnosis present

## 2019-03-30 DIAGNOSIS — M1712 Unilateral primary osteoarthritis, left knee: Secondary | ICD-10-CM | POA: Diagnosis present

## 2019-03-30 LAB — SARS CORONAVIRUS 2 (TAT 6-24 HRS): SARS Coronavirus 2: NEGATIVE

## 2019-03-30 NOTE — H&P (Signed)
TOTAL KNEE ADMISSION H&P  Patient is being admitted for left total knee arthroplasty.  Subjective:  Chief Complaint:left knee pain.  HPI: Carol Gilbert, 68 y.o. female, has a history of pain and functional disability in the left knee due to arthritis and has failed non-surgical conservative treatments for greater than 12 weeks to includeNSAID's and/or analgesics, corticosteriod injections, use of assistive devices, weight reduction as appropriate and activity modification.  Onset of symptoms was gradual, starting 2 years ago with gradually worsening course since that time. The patient noted no past surgery on the left knee(s).  Patient currently rates pain in the left knee(s) at 10 out of 10 with activity. Patient has night pain, worsening of pain with activity and weight bearing, pain that interferes with activities of daily living, pain with passive range of motion, crepitus and joint swelling.  Patient has evidence of joint space narrowing by imaging studies.  There is no active infection.  Patient Active Problem List   Diagnosis Date Noted  . Asthma 02/17/2019   Past Medical History:  Diagnosis Date  . Apnea   . Arthritis   . Asthma   . Diabetes mellitus without complication ([place])   . Diverticulitis   . Fibromyalgia   . Hypertension     Past Surgical History:  Procedure Laterality Date  . ABDOMINAL HYSTERECTOMY    . ABDOMINAL SURGERY    . APPENDECTOMY    . BREAST BIOPSY    . CESAREAN SECTION    . CHOLECYSTECTOMY    . HERNIA REPAIR    . SINUS SURGERY WITH INSTATRAK    . WISDOM TOOTH EXTRACTION      No current facility-administered medications for this encounter.    Current Outpatient Medications  Medication Sig Dispense Refill Last Dose  . Ascorbic Acid (VITAMIN C) 1000 MG tablet Take 1,000 mg by mouth daily.     Marland Kitchen b complex vitamins tablet Take 1 tablet by mouth every evening.      . Biotin 5000 MCG TABS Take 15,000 mcg by mouth every evening.     . bisacodyl  (DULCOLAX) 5 MG EC tablet Take 5 mg by mouth 2 (two) times daily as needed for moderate constipation (constipation.).     Marland Kitchen Calcium-Phosphorus-Vitamin D (CITRACAL +D3 PO) Take 1 tablet by mouth every evening.     . cetirizine (KLS ALLER-TEC) 10 MG tablet Take 10 mg by mouth daily.     . Cholecalciferol (VITAMIN D3) 50 MCG (2000 UT) TABS Take 2,000 Units by mouth daily.     Marland Kitchen docusate sodium (COLACE) 100 MG capsule Take 300 mg by mouth at bedtime.     . DULoxetine (CYMBALTA) 60 MG capsule Take 60 mg by mouth daily.     . fluticasone (FLONASE) 50 MCG/ACT nasal spray Place 2 sprays into both nostrils daily. (Patient taking differently: Place 2 sprays into both nostrils daily as needed for allergies. ) 16 g 2   . levalbuterol (XOPENEX HFA) 45 MCG/ACT inhaler Inhale 1-2 puffs into the lungs every 4 (four) hours as needed for wheezing.     . Methylcellulose, Laxative, (CITRUCEL) 500 MG TABS Take 500 mg by mouth 2 (two) times a day. Morning & Evening     . montelukast (SINGULAIR) 10 MG tablet Take 10 mg by mouth daily.      Marland Kitchen MUCINEX MAXIMUM STRENGTH 1200 MG TB12 Take 1,200 mg by mouth every evening.     . Multiple Vitamin (MULTIVITAMIN WITH MINERALS) TABS tablet Take 1 tablet by  mouth daily. One-A-Day Women's     . saccharomyces boulardii (FLORASTOR) 250 MG capsule Take 250 mg by mouth 2 (two) times daily.     . SYMBICORT 160-4.5 MCG/ACT inhaler Inhale 2 puffs into the lungs 2 (two) times a day.      . albuterol (PROVENTIL HFA;VENTOLIN HFA) 108 (90 Base) MCG/ACT inhaler Inhale 2 puffs into the lungs every 6 (six) hours as needed for wheezing or shortness of breath. (Patient not taking: Reported on 03/29/2019) 1 Inhaler 6 Not Taking at Unknown time  . Docusate Calcium (STOOL SOFTENER PO) Take by mouth as needed.      Allergies  Allergen Reactions  . Shellfish Allergy Anaphylaxis    Social History   Tobacco Use  . Smoking status: Never Smoker  . Smokeless tobacco: Never Used  Substance Use Topics   . Alcohol use: No    No family history on file.   Review of Systems  Constitutional: Negative.   HENT: Negative.   Eyes: Negative.   Respiratory:       Asthma  Cardiovascular: Negative.   Gastrointestinal: Negative.   Genitourinary: Negative.   Musculoskeletal: Positive for joint pain and myalgias.  Skin: Negative.   Neurological: Negative.   Endo/Heme/Allergies: Negative.   Psychiatric/Behavioral: Negative.     Objective:  Physical Exam  Constitutional: She is oriented to person, place, and time. She appears well-developed and well-nourished.  HENT:  Head: Normocephalic and atraumatic.  Eyes: Pupils are equal, round, and reactive to light.  Neck: Normal range of motion. Neck supple.  Cardiovascular: Intact distal pulses.  Respiratory: Effort normal.  Musculoskeletal:        General: Tenderness present.     Comments: No erythema or warmth.  Adipose tissue buildup around the knee.  Unable to assess patellar ballottement.  Tenderness palpation over the medial joint line.  Full range of motion knee extension and knee flexion.  Strength 5 out of 5 throughout testing.  Negative ligamentous testing.  Negative McMurray test.  Neurological: She is alert and oriented to person, place, and time.  Skin: Skin is warm and dry.  Psychiatric: She has a normal mood and affect. Her behavior is normal. Judgment and thought content normal.    Vital signs in last 24 hours: BP: ()/()  Arterial Line BP: ()/()   Labs:   Estimated body mass index is 37.68 kg/m as calculated from the following:   Height as of 02/17/19: 5\' 1"  (1.549 m).   Weight as of 02/17/19: 90.4 kg.   Imaging Review Plain radiographs demonstrate Bone-on-bone degenerative changes noted in the medial compartment of the left knee.  Moderate to severe arthritis noted in the right knee medial compartment.     Assessment/Plan:  End stage arthritis, left knee   The patient history, physical examination, clinical  judgment of the provider and imaging studies are consistent with end stage degenerative joint disease of the left knee(s) and total knee arthroplasty is deemed medically necessary. The treatment options including medical management, injection therapy arthroscopy and arthroplasty were discussed at length. The risks and benefits of total knee arthroplasty were presented and reviewed. The risks due to aseptic loosening, infection, stiffness, patella tracking problems, thromboembolic complications and other imponderables were discussed. The patient acknowledged the explanation, agreed to proceed with the plan and consent was signed. Patient is being admitted for inpatient treatment for surgery, pain control, PT, OT, prophylactic antibiotics, VTE prophylaxis, progressive ambulation and ADL's and discharge planning. The patient is planning to be discharged home  with home health services     Patient's anticipated LOS is less than 2 midnights, meeting these requirements: - Younger than 2 - Lives within 1 hour of care - Has a competent adult at home to recover with post-op recover - NO history of  - Chronic pain requiring opiods  - Diabetes  - Coronary Artery Disease  - Heart failure  - Heart attack  - Stroke  - DVT/VTE  - Cardiac arrhythmia  - Respiratory Failure/COPD  - Renal failure  - Anemia  - Advanced Liver disease

## 2019-03-31 ENCOUNTER — Other Ambulatory Visit: Payer: Self-pay

## 2019-03-31 ENCOUNTER — Encounter (HOSPITAL_COMMUNITY)
Admission: RE | Admit: 2019-03-31 | Discharge: 2019-03-31 | Disposition: A | Discharge status: Home / Self Care | Payer: Medicare Other | Source: Ambulatory Visit | Attending: Orthopedic Surgery | Admitting: Orthopedic Surgery

## 2019-03-31 ENCOUNTER — Encounter (HOSPITAL_COMMUNITY): Payer: Self-pay

## 2019-03-31 ENCOUNTER — Ambulatory Visit (HOSPITAL_COMMUNITY)
Admission: RE | Admit: 2019-03-31 | Discharge: 2019-03-31 | Disposition: A | Discharge status: Home / Self Care | Payer: Medicare Other | Source: Ambulatory Visit | Attending: Orthopedic Surgery | Admitting: Orthopedic Surgery

## 2019-03-31 DIAGNOSIS — Z01818 Encounter for other preprocedural examination: Secondary | ICD-10-CM | POA: Diagnosis not present

## 2019-03-31 DIAGNOSIS — M1712 Unilateral primary osteoarthritis, left knee: Secondary | ICD-10-CM | POA: Diagnosis not present

## 2019-03-31 LAB — CBC WITH DIFFERENTIAL/PLATELET
Abs Immature Granulocytes: 0.02 10*3/uL (ref 0.00–0.07)
Basophils Absolute: 0.1 10*3/uL (ref 0.0–0.1)
Basophils Relative: 1 %
Eosinophils Absolute: 0.1 10*3/uL (ref 0.0–0.5)
Eosinophils Relative: 2 %
HCT: 41.7 % (ref 36.0–46.0)
Hemoglobin: 12.9 g/dL (ref 12.0–15.0)
Immature Granulocytes: 0 %
Lymphocytes Relative: 26 %
Lymphs Abs: 1.9 10*3/uL (ref 0.7–4.0)
MCH: 29.1 pg (ref 26.0–34.0)
MCHC: 30.9 g/dL (ref 30.0–36.0)
MCV: 94.1 fL (ref 80.0–100.0)
Monocytes Absolute: 0.5 10*3/uL (ref 0.1–1.0)
Monocytes Relative: 7 %
Neutro Abs: 4.6 10*3/uL (ref 1.7–7.7)
Neutrophils Relative %: 64 %
Platelets: 252 10*3/uL (ref 150–400)
RBC: 4.43 MIL/uL (ref 3.87–5.11)
RDW: 13.6 % (ref 11.5–15.5)
WBC: 7.1 10*3/uL (ref 4.0–10.5)
nRBC: 0 % (ref 0.0–0.2)

## 2019-03-31 LAB — BASIC METABOLIC PANEL
Anion gap: 7 (ref 5–15)
BUN: 20 mg/dL (ref 8–23)
CO2: 29 mmol/L (ref 22–32)
Calcium: 10.2 mg/dL (ref 8.9–10.3)
Chloride: 105 mmol/L (ref 98–111)
Creatinine, Ser: 0.55 mg/dL (ref 0.44–1.00)
GFR calc Af Amer: >60 mL/min (ref >60)
GFR calc non Af Amer: >60 mL/min (ref >60)
Glucose, Bld: 99 mg/dL (ref 70–99)
Potassium: 4.5 mmol/L (ref 3.5–5.1)
Sodium: 141 mmol/L (ref 135–145)

## 2019-03-31 LAB — URINALYSIS, ROUTINE W REFLEX MICROSCOPIC
Bilirubin Urine: NEGATIVE
Glucose, UA: NEGATIVE mg/dL
Hgb urine dipstick: NEGATIVE
Ketones, ur: NEGATIVE mg/dL
Leukocytes,Ua: NEGATIVE
Nitrite: NEGATIVE
Protein, ur: NEGATIVE mg/dL
Specific Gravity, Urine: 1.015 (ref 1.005–1.030)
pH: 7 (ref 5.0–8.0)

## 2019-03-31 LAB — PROTIME-INR
INR: 1 (ref 0.8–1.2)
Prothrombin Time: 13 seconds (ref 11.4–15.2)

## 2019-03-31 LAB — HEMOGLOBIN A1C
Hgb A1c MFr Bld: 5.1 % (ref 4.8–5.6)
Mean Plasma Glucose: 99.67 mg/dL

## 2019-03-31 LAB — SURGICAL PCR SCREEN
MRSA, PCR: NEGATIVE
Staphylococcus aureus: NEGATIVE

## 2019-03-31 LAB — APTT: aPTT: 30 seconds (ref 24–36)

## 2019-03-31 LAB — ABO/RH: ABO/RH(D): O POS

## 2019-04-02 MED ORDER — TRANEXAMIC ACID 1000 MG/10ML IV SOLN
2000.0000 mg | INTRAVENOUS | Status: DC
  Filled 2019-04-02: qty 20

## 2019-04-02 MED ORDER — BUPIVACAINE LIPOSOME 1.3 % IJ SUSP
20.0000 mL | Freq: Once | INTRAMUSCULAR | Status: DC
  Filled 2019-04-02: qty 20

## 2019-04-03 ENCOUNTER — Inpatient Hospital Stay (HOSPITAL_COMMUNITY)
Admission: RE | Admit: 2019-04-03 | Discharge: 2019-04-05 | DRG: 470 | Disposition: A | Discharge status: Home Health | Payer: Medicare Other | Attending: Orthopedic Surgery | Admitting: Orthopedic Surgery

## 2019-04-03 ENCOUNTER — Ambulatory Visit (HOSPITAL_COMMUNITY): Payer: Medicare Other | Admitting: Certified Registered Nurse Anesthetist

## 2019-04-03 ENCOUNTER — Encounter (HOSPITAL_COMMUNITY)
Admission: RE | Disposition: A | Discharge status: Home Health | Payer: Self-pay | Source: Home / Self Care | Attending: Orthopedic Surgery

## 2019-04-03 ENCOUNTER — Encounter (HOSPITAL_COMMUNITY): Payer: Self-pay | Admitting: Certified Registered Nurse Anesthetist

## 2019-04-03 ENCOUNTER — Ambulatory Visit (HOSPITAL_COMMUNITY): Payer: Medicare Other | Admitting: Physician Assistant

## 2019-04-03 ENCOUNTER — Other Ambulatory Visit: Payer: Self-pay

## 2019-04-03 DIAGNOSIS — F329 Major depressive disorder, single episode, unspecified: Secondary | ICD-10-CM | POA: Diagnosis present

## 2019-04-03 DIAGNOSIS — Z91013 Allergy to seafood: Secondary | ICD-10-CM

## 2019-04-03 DIAGNOSIS — Z96652 Presence of left artificial knee joint: Secondary | ICD-10-CM

## 2019-04-03 DIAGNOSIS — M797 Fibromyalgia: Secondary | ICD-10-CM | POA: Diagnosis present

## 2019-04-03 DIAGNOSIS — E669 Obesity, unspecified: Secondary | ICD-10-CM | POA: Diagnosis present

## 2019-04-03 DIAGNOSIS — M1712 Unilateral primary osteoarthritis, left knee: Principal | ICD-10-CM | POA: Diagnosis present

## 2019-04-03 DIAGNOSIS — I10 Essential (primary) hypertension: Secondary | ICD-10-CM | POA: Diagnosis present

## 2019-04-03 DIAGNOSIS — E119 Type 2 diabetes mellitus without complications: Secondary | ICD-10-CM | POA: Diagnosis present

## 2019-04-03 DIAGNOSIS — J449 Chronic obstructive pulmonary disease, unspecified: Secondary | ICD-10-CM | POA: Diagnosis present

## 2019-04-03 DIAGNOSIS — D62 Acute posthemorrhagic anemia: Secondary | ICD-10-CM | POA: Diagnosis not present

## 2019-04-03 DIAGNOSIS — Z6836 Body mass index (BMI) 36.0-36.9, adult: Secondary | ICD-10-CM

## 2019-04-03 DIAGNOSIS — Z7951 Long term (current) use of inhaled steroids: Secondary | ICD-10-CM

## 2019-04-03 DIAGNOSIS — F419 Anxiety disorder, unspecified: Secondary | ICD-10-CM | POA: Diagnosis present

## 2019-04-03 HISTORY — PX: TOTAL KNEE ARTHROPLASTY: SHX125

## 2019-04-03 LAB — TYPE AND SCREEN
ABO/RH(D): O POS
Antibody Screen: NEGATIVE

## 2019-04-03 LAB — GLUCOSE, CAPILLARY
Glucose-Capillary: 87 mg/dL (ref 70–99)
Glucose-Capillary: 80 mg/dL (ref 70–99)

## 2019-04-03 SURGERY — ARTHROPLASTY, KNEE, TOTAL
Anesthesia: Spinal | Laterality: Left

## 2019-04-03 MED ORDER — PHENOL 1.4 % MT LIQD: 1.0000 | OROMUCOSAL | Status: DC | PRN

## 2019-04-03 MED ORDER — LEVALBUTEROL TARTRATE 45 MCG/ACT IN AERO: 1.0000 | INHALATION_SPRAY | RESPIRATORY_TRACT | Status: DC | PRN

## 2019-04-03 MED ORDER — SODIUM CHLORIDE (PF) 0.9 % IJ SOLN
INTRAMUSCULAR | Status: DC | PRN
  Administered 2019-04-03: 70 mL

## 2019-04-03 MED ORDER — BUPIVACAINE-EPINEPHRINE (PF) 0.25% -1:200000 IJ SOLN
INTRAMUSCULAR | Status: AC
  Filled 2019-04-03: qty 30

## 2019-04-03 MED ORDER — SODIUM CHLORIDE (PF) 0.9 % IJ SOLN
INTRAMUSCULAR | Status: AC
  Filled 2019-04-03: qty 50

## 2019-04-03 MED ORDER — ONDANSETRON HCL 4 MG/2ML IJ SOLN
INTRAMUSCULAR | Status: DC | PRN
  Administered 2019-04-03: 4 mg via INTRAVENOUS

## 2019-04-03 MED ORDER — PROPOFOL 10 MG/ML IV BOLUS
INTRAVENOUS | Status: AC
  Filled 2019-04-03: qty 60

## 2019-04-03 MED ORDER — MOMETASONE FURO-FORMOTEROL FUM 200-5 MCG/ACT IN AERO
2.0000 | INHALATION_SPRAY | Freq: Two times a day (BID) | RESPIRATORY_TRACT | Status: DC
  Administered 2019-04-04 – 2019-04-05 (×3): 2 via RESPIRATORY_TRACT
  Filled 2019-04-03 (×2): qty 8.8

## 2019-04-03 MED ORDER — FENTANYL CITRATE (PF) 100 MCG/2ML IJ SOLN
50.0000 ug | Freq: Once | INTRAMUSCULAR | Status: AC
  Administered 2019-04-03: 12:00:00 100 ug via INTRAVENOUS
  Filled 2019-04-03: qty 2

## 2019-04-03 MED ORDER — BUPIVACAINE LIPOSOME 1.3 % IJ SUSP
INTRAMUSCULAR | Status: DC | PRN
  Administered 2019-04-03: 20 mL

## 2019-04-03 MED ORDER — MEPERIDINE HCL 50 MG/ML IJ SOLN
6.2500 mg | INTRAMUSCULAR | Status: DC | PRN
  Administered 2019-04-03: 14:00:00 12.5 mg via INTRAVENOUS

## 2019-04-03 MED ORDER — POVIDONE-IODINE 10 % EX SWAB
2.0000 "application " | Freq: Once | CUTANEOUS | Status: AC
  Administered 2019-04-03: 2 via TOPICAL

## 2019-04-03 MED ORDER — CHLORHEXIDINE GLUCONATE 4 % EX LIQD: 60.0000 mL | Freq: Once | CUTANEOUS | Status: DC

## 2019-04-03 MED ORDER — MIDAZOLAM HCL 2 MG/2ML IJ SOLN
1.0000 mg | Freq: Once | INTRAMUSCULAR | Status: AC
  Administered 2019-04-03: 12:00:00 2 mg via INTRAVENOUS
  Filled 2019-04-03: qty 2

## 2019-04-03 MED ORDER — MONTELUKAST SODIUM 10 MG PO TABS
10.0000 mg | ORAL_TABLET | Freq: Every day | ORAL | Status: DC
  Administered 2019-04-04 – 2019-04-05 (×2): 10 mg via ORAL
  Filled 2019-04-03 (×3): qty 1

## 2019-04-03 MED ORDER — PROPOFOL 10 MG/ML IV BOLUS
INTRAVENOUS | Status: DC | PRN
  Administered 2019-04-03: 20 mg via INTRAVENOUS

## 2019-04-03 MED ORDER — ALUM & MAG HYDROXIDE-SIMETH 200-200-20 MG/5ML PO SUSP
30.0000 mL | ORAL | Status: DC | PRN
  Filled 2019-04-03: qty 30

## 2019-04-03 MED ORDER — GABAPENTIN 300 MG PO CAPS
300.0000 mg | ORAL_CAPSULE | Freq: Three times a day (TID) | ORAL | Status: DC
  Administered 2019-04-03 – 2019-04-05 (×5): 300 mg via ORAL
  Filled 2019-04-03 (×5): qty 1

## 2019-04-03 MED ORDER — METHOCARBAMOL 500 MG IVPB - SIMPLE MED
500.0000 mg | Freq: Four times a day (QID) | INTRAVENOUS | Status: DC | PRN
  Administered 2019-04-03: 14:00:00 500 mg via INTRAVENOUS
  Filled 2019-04-03: qty 50

## 2019-04-03 MED ORDER — SODIUM CHLORIDE (PF) 0.9 % IJ SOLN
INTRAMUSCULAR | Status: AC
  Filled 2019-04-03: qty 20

## 2019-04-03 MED ORDER — ONDANSETRON HCL 4 MG/2ML IJ SOLN
INTRAMUSCULAR | Status: AC
  Filled 2019-04-03: qty 2

## 2019-04-03 MED ORDER — ONDANSETRON HCL 4 MG PO TABS
4.0000 mg | ORAL_TABLET | Freq: Four times a day (QID) | ORAL | Status: DC | PRN
  Filled 2019-04-03: qty 1

## 2019-04-03 MED ORDER — HYDROMORPHONE HCL 1 MG/ML IJ SOLN
INTRAMUSCULAR | Status: AC
  Filled 2019-04-03: qty 1

## 2019-04-03 MED ORDER — CALCIUM CARBONATE-VITAMIN D 500-200 MG-UNIT PO TABS
1.0000 | ORAL_TABLET | Freq: Every day | ORAL | Status: DC
  Administered 2019-04-04 – 2019-04-05 (×2): 1 via ORAL
  Filled 2019-04-03 (×3): qty 1

## 2019-04-03 MED ORDER — FLEET ENEMA 7-19 GM/118ML RE ENEM: 1.0000 | ENEMA | Freq: Once | RECTAL | Status: DC | PRN

## 2019-04-03 MED ORDER — CLONIDINE HCL (ANALGESIA) 100 MCG/ML EP SOLN
EPIDURAL | Status: DC | PRN
  Administered 2019-04-03: 100 ug

## 2019-04-03 MED ORDER — SODIUM CHLORIDE 0.9 % IR SOLN
Status: DC | PRN
  Administered 2019-04-03: 1000 mL

## 2019-04-03 MED ORDER — KCL IN DEXTROSE-NACL 20-5-0.45 MEQ/L-%-% IV SOLN
INTRAVENOUS | Status: DC
  Administered 2019-04-03 – 2019-04-04 (×3): via INTRAVENOUS
  Filled 2019-04-03 (×3): qty 1000

## 2019-04-03 MED ORDER — BISACODYL 5 MG PO TBEC: 5.0000 mg | DELAYED_RELEASE_TABLET | Freq: Every day | ORAL | Status: DC | PRN

## 2019-04-03 MED ORDER — WATER FOR IRRIGATION, STERILE IR SOLN
Status: DC | PRN
  Administered 2019-04-03: 2000 mL

## 2019-04-03 MED ORDER — KETOROLAC TROMETHAMINE 30 MG/ML IJ SOLN: 30.0000 mg | Freq: Once | INTRAMUSCULAR | Status: DC | PRN

## 2019-04-03 MED ORDER — METOCLOPRAMIDE HCL 5 MG PO TABS
5.0000 mg | ORAL_TABLET | Freq: Three times a day (TID) | ORAL | Status: DC | PRN
  Filled 2019-04-03: qty 2

## 2019-04-03 MED ORDER — FLUTICASONE PROPIONATE 50 MCG/ACT NA SUSP: 2.0000 | Freq: Every day | NASAL | Status: DC | PRN

## 2019-04-03 MED ORDER — PROPOFOL 500 MG/50ML IV EMUL
INTRAVENOUS | Status: DC | PRN
  Administered 2019-04-03: 75 ug/kg/min via INTRAVENOUS

## 2019-04-03 MED ORDER — SACCHAROMYCES BOULARDII 250 MG PO CAPS
250.0000 mg | ORAL_CAPSULE | Freq: Two times a day (BID) | ORAL | Status: DC
  Administered 2019-04-04 – 2019-04-05 (×3): 250 mg via ORAL
  Filled 2019-04-03 (×4): qty 1

## 2019-04-03 MED ORDER — DIPHENHYDRAMINE HCL 12.5 MG/5ML PO ELIX
12.5000 mg | ORAL_SOLUTION | ORAL | Status: DC | PRN
  Filled 2019-04-03: qty 10

## 2019-04-03 MED ORDER — CALCIUM-PHOSPHORUS-VITAMIN D 250-107-500 MG-MG-UNIT PO CHEW: 1.0000 | CHEWABLE_TABLET | Freq: Every evening | ORAL | Status: DC

## 2019-04-03 MED ORDER — METOCLOPRAMIDE HCL 5 MG/ML IJ SOLN: 5.0000 mg | Freq: Three times a day (TID) | INTRAMUSCULAR | Status: DC | PRN

## 2019-04-03 MED ORDER — ACETAMINOPHEN 325 MG PO TABS: 325.0000 mg | ORAL_TABLET | Freq: Four times a day (QID) | ORAL | Status: DC | PRN

## 2019-04-03 MED ORDER — DEXAMETHASONE SODIUM PHOSPHATE 10 MG/ML IJ SOLN
10.0000 mg | Freq: Once | INTRAMUSCULAR | Status: AC
  Administered 2019-04-04: 10:00:00 10 mg via INTRAVENOUS
  Filled 2019-04-03: qty 1

## 2019-04-03 MED ORDER — ROPIVACAINE HCL 5 MG/ML IJ SOLN
INTRAMUSCULAR | Status: DC | PRN
  Administered 2019-04-03 (×2): 5 mL via PERINEURAL

## 2019-04-03 MED ORDER — LORATADINE 10 MG PO TABS
10.0000 mg | ORAL_TABLET | Freq: Every day | ORAL | Status: DC
  Administered 2019-04-04 – 2019-04-05 (×2): 10 mg via ORAL
  Filled 2019-04-03 (×3): qty 1

## 2019-04-03 MED ORDER — ASPIRIN 81 MG PO CHEW
81.0000 mg | CHEWABLE_TABLET | Freq: Two times a day (BID) | ORAL | Status: DC
  Administered 2019-04-04 – 2019-04-05 (×3): 81 mg via ORAL
  Filled 2019-04-03 (×4): qty 1

## 2019-04-03 MED ORDER — KCL IN DEXTROSE-NACL 20-5-0.45 MEQ/L-%-% IV SOLN
INTRAVENOUS | Status: AC
  Filled 2019-04-03: qty 1000

## 2019-04-03 MED ORDER — BUPIVACAINE IN DEXTROSE 0.75-8.25 % IT SOLN
INTRATHECAL | Status: DC | PRN
  Administered 2019-04-03: 1.6 mL via INTRATHECAL

## 2019-04-03 MED ORDER — DOCUSATE SODIUM 100 MG PO CAPS: 300.0000 mg | ORAL_CAPSULE | Freq: Every day | ORAL | Status: DC

## 2019-04-03 MED ORDER — PROPOFOL 10 MG/ML IV BOLUS
INTRAVENOUS | Status: AC
  Filled 2019-04-03: qty 20

## 2019-04-03 MED ORDER — LACTATED RINGERS IV SOLN
INTRAVENOUS | Status: DC
  Administered 2019-04-03 (×2): via INTRAVENOUS

## 2019-04-03 MED ORDER — CEFAZOLIN SODIUM-DEXTROSE 2-4 GM/100ML-% IV SOLN
2.0000 g | INTRAVENOUS | Status: AC
  Administered 2019-04-03: 2 g via INTRAVENOUS
  Filled 2019-04-03: qty 100

## 2019-04-03 MED ORDER — HYDROMORPHONE HCL 1 MG/ML IJ SOLN: 0.2500 mg | INTRAMUSCULAR | Status: DC | PRN

## 2019-04-03 MED ORDER — METHOCARBAMOL 500 MG IVPB - SIMPLE MED
INTRAVENOUS | Status: AC
  Filled 2019-04-03: qty 50

## 2019-04-03 MED ORDER — MENTHOL 3 MG MT LOZG: 1.0000 | LOZENGE | OROMUCOSAL | Status: DC | PRN

## 2019-04-03 MED ORDER — METHYLCELLULOSE (LAXATIVE) 500 MG PO TABS: 500.0000 mg | ORAL_TABLET | Freq: Two times a day (BID) | ORAL | Status: DC | PRN

## 2019-04-03 MED ORDER — GUAIFENESIN ER 600 MG PO TB12
1200.0000 mg | ORAL_TABLET | Freq: Every evening | ORAL | Status: DC
  Administered 2019-04-03 – 2019-04-04 (×2): 1200 mg via ORAL
  Filled 2019-04-03 (×3): qty 2

## 2019-04-03 MED ORDER — TRANEXAMIC ACID-NACL 1000-0.7 MG/100ML-% IV SOLN
1000.0000 mg | INTRAVENOUS | Status: AC
  Administered 2019-04-03: 12:00:00 1000 mg via INTRAVENOUS
  Filled 2019-04-03: qty 100

## 2019-04-03 MED ORDER — ONDANSETRON HCL 4 MG/2ML IJ SOLN: 4.0000 mg | Freq: Four times a day (QID) | INTRAMUSCULAR | Status: DC | PRN

## 2019-04-03 MED ORDER — ROPIVACAINE HCL 7.5 MG/ML IJ SOLN
INTRAMUSCULAR | Status: DC | PRN
  Administered 2019-04-03 (×4): 5 mL via PERINEURAL

## 2019-04-03 MED ORDER — OXYCODONE-ACETAMINOPHEN 5-325 MG PO TABS: 1.0000 | ORAL_TABLET | ORAL | 0 refills | Status: DC | PRN

## 2019-04-03 MED ORDER — OXYCODONE HCL 5 MG PO TABS
ORAL_TABLET | ORAL | Status: AC
  Filled 2019-04-03: qty 1

## 2019-04-03 MED ORDER — METHOCARBAMOL 500 MG PO TABS: 500.0000 mg | ORAL_TABLET | Freq: Four times a day (QID) | ORAL | Status: DC | PRN

## 2019-04-03 MED ORDER — BUPIVACAINE-EPINEPHRINE (PF) 0.25% -1:200000 IJ SOLN
INTRAMUSCULAR | Status: DC | PRN
  Administered 2019-04-03: 30 mL

## 2019-04-03 MED ORDER — PROMETHAZINE HCL 25 MG/ML IJ SOLN: 6.2500 mg | INTRAMUSCULAR | Status: DC | PRN

## 2019-04-03 MED ORDER — LIDOCAINE 2% (20 MG/ML) 5 ML SYRINGE
INTRAMUSCULAR | Status: AC
  Filled 2019-04-03: qty 5

## 2019-04-03 MED ORDER — DOCUSATE SODIUM 100 MG PO CAPS
100.0000 mg | ORAL_CAPSULE | Freq: Two times a day (BID) | ORAL | Status: DC
  Administered 2019-04-03 – 2019-04-05 (×4): 100 mg via ORAL
  Filled 2019-04-03 (×5): qty 1

## 2019-04-03 MED ORDER — OXYCODONE HCL 5 MG PO TABS
5.0000 mg | ORAL_TABLET | ORAL | Status: DC | PRN
  Administered 2019-04-03 (×3): 5 mg via ORAL
  Administered 2019-04-04: 15:00:00 10 mg via ORAL
  Administered 2019-04-04: 02:00:00 5 mg via ORAL
  Administered 2019-04-04 – 2019-04-05 (×4): 10 mg via ORAL
  Filled 2019-04-03: qty 2
  Filled 2019-04-03 (×2): qty 1
  Filled 2019-04-03 (×4): qty 2

## 2019-04-03 MED ORDER — DULOXETINE HCL 60 MG PO CPEP
60.0000 mg | ORAL_CAPSULE | Freq: Every day | ORAL | Status: DC
  Administered 2019-04-04 – 2019-04-05 (×2): 60 mg via ORAL
  Filled 2019-04-03 (×3): qty 1

## 2019-04-03 MED ORDER — TRANEXAMIC ACID 1000 MG/10ML IV SOLN
INTRAVENOUS | Status: DC | PRN
  Administered 2019-04-03: 2000 mg via TOPICAL

## 2019-04-03 MED ORDER — TRANEXAMIC ACID-NACL 1000-0.7 MG/100ML-% IV SOLN
1000.0000 mg | Freq: Once | INTRAVENOUS | Status: AC
  Administered 2019-04-03: 19:00:00 1000 mg via INTRAVENOUS
  Filled 2019-04-03: qty 100

## 2019-04-03 MED ORDER — MEPERIDINE HCL 50 MG/ML IJ SOLN
INTRAMUSCULAR | Status: AC
  Filled 2019-04-03: qty 1

## 2019-04-03 MED ORDER — POLYETHYLENE GLYCOL 3350 17 G PO PACK: 17.0000 g | PACK | Freq: Every day | ORAL | Status: DC | PRN

## 2019-04-03 MED ORDER — TIZANIDINE HCL 2 MG PO TABS: 2.0000 mg | ORAL_TABLET | Freq: Four times a day (QID) | ORAL | 0 refills | Status: DC | PRN

## 2019-04-03 MED ORDER — HYDROMORPHONE HCL 1 MG/ML IJ SOLN
0.5000 mg | INTRAMUSCULAR | Status: DC | PRN
  Administered 2019-04-03: 16:00:00 1 mg via INTRAVENOUS
  Administered 2019-04-04: 06:00:00 0.5 mg via INTRAVENOUS
  Filled 2019-04-03: qty 1

## 2019-04-03 MED ORDER — ASPIRIN EC 81 MG PO TBEC: 81.0000 mg | DELAYED_RELEASE_TABLET | Freq: Two times a day (BID) | ORAL | 0 refills | Status: DC

## 2019-04-03 MED ORDER — PANTOPRAZOLE SODIUM 40 MG PO TBEC
40.0000 mg | DELAYED_RELEASE_TABLET | Freq: Every day | ORAL | Status: DC
  Administered 2019-04-04 – 2019-04-05 (×2): 40 mg via ORAL
  Filled 2019-04-03 (×3): qty 1

## 2019-04-03 SURGICAL SUPPLY — 52 items
ATTUNE MED DOME PAT 32 KNEE (Knees) ×1 IMPLANT
ATTUNE MED DOME PAT 32MM KNEE (Knees) ×1 IMPLANT
ATTUNE PSFEM LTSZ6 NARCEM KNEE (Femur) ×2 IMPLANT
ATTUNE PSRP INSR SZ6 5 KNEE (Insert) ×1 IMPLANT
ATTUNE PSRP INSR SZ6 5MM KNEE (Insert) ×1 IMPLANT
BAG DECANTER FOR FLEXI CONT (MISCELLANEOUS) ×3 IMPLANT
BAG ZIPLOCK 12X15 (MISCELLANEOUS) ×3 IMPLANT
BASE TIBIAL ROT PLAT SZ 5 KNEE (Knees) IMPLANT
BLADE SAG 18X100X1.27 (BLADE) ×3 IMPLANT
BLADE SAW SGTL 11.0X1.19X90.0M (BLADE) ×3 IMPLANT
BLADE SURG SZ10 CARB STEEL (BLADE) ×6 IMPLANT
BNDG ELASTIC 6X10 VLCR STRL LF (GAUZE/BANDAGES/DRESSINGS) ×3 IMPLANT
BOWL SMART MIX CTS (DISPOSABLE) ×3 IMPLANT
CEMENT HV SMART SET (Cement) ×6 IMPLANT
COVER SURGICAL LIGHT HANDLE (MISCELLANEOUS) ×3 IMPLANT
COVER WAND RF STERILE (DRAPES) IMPLANT
CUFF TOURN SGL QUICK 34 (TOURNIQUET CUFF) ×2
CUFF TRNQT CYL 34X4.125X (TOURNIQUET CUFF) ×1 IMPLANT
DECANTER SPIKE VIAL GLASS SM (MISCELLANEOUS) ×9 IMPLANT
DRAPE U-SHAPE 47X51 STRL (DRAPES) ×3 IMPLANT
DRSG AQUACEL AG ADV 3.5X10 (GAUZE/BANDAGES/DRESSINGS) ×3 IMPLANT
DURAPREP 26ML APPLICATOR (WOUND CARE) ×3 IMPLANT
ELECT REM PT RETURN 15FT ADLT (MISCELLANEOUS) ×3 IMPLANT
GLOVE BIO SURGEON STRL SZ7.5 (GLOVE) ×3 IMPLANT
GLOVE BIO SURGEON STRL SZ8.5 (GLOVE) ×3 IMPLANT
GLOVE BIOGEL PI IND STRL 8 (GLOVE) ×1 IMPLANT
GLOVE BIOGEL PI IND STRL 9 (GLOVE) ×1 IMPLANT
GLOVE BIOGEL PI INDICATOR 8 (GLOVE) ×2
GLOVE BIOGEL PI INDICATOR 9 (GLOVE) ×2
GOWN STRL REUS W/TWL XL LVL3 (GOWN DISPOSABLE) ×6 IMPLANT
HANDPIECE INTERPULSE COAX TIP (DISPOSABLE) ×2
HOOD PEEL AWAY FLYTE STAYCOOL (MISCELLANEOUS) ×9 IMPLANT
KIT TURNOVER KIT A (KITS) IMPLANT
NDL HYPO 21X1.5 SAFETY (NEEDLE) ×2 IMPLANT
NEEDLE HYPO 21X1.5 SAFETY (NEEDLE) ×6 IMPLANT
NS IRRIG 1000ML POUR BTL (IV SOLUTION) ×3 IMPLANT
PACK ICE MAXI GEL EZY WRAP (MISCELLANEOUS) ×3 IMPLANT
PACK TOTAL KNEE CUSTOM (KITS) ×3 IMPLANT
PIN STEINMAN FIXATION KNEE (PIN) ×2 IMPLANT
PIN THREADED HEADED SIGMA (PIN) ×2 IMPLANT
PROTECTOR NERVE ULNAR (MISCELLANEOUS) ×3 IMPLANT
SET HNDPC FAN SPRY TIP SCT (DISPOSABLE) ×1 IMPLANT
SUT VIC AB 1 CTX 36 (SUTURE) ×2
SUT VIC AB 1 CTX36XBRD ANBCTR (SUTURE) ×1 IMPLANT
SUT VIC AB 3-0 CT1 27 (SUTURE) ×6
SUT VIC AB 3-0 CT1 TAPERPNT 27 (SUTURE) ×3 IMPLANT
SYR CONTROL 10ML LL (SYRINGE) ×6 IMPLANT
TIBIAL BASE ROT PLAT SZ 5 KNEE (Knees) ×3 IMPLANT
TRAY FOLEY MTR SLVR 16FR STAT (SET/KITS/TRAYS/PACK) ×3 IMPLANT
WATER STERILE IRR 1000ML POUR (IV SOLUTION) ×6 IMPLANT
WRAP KNEE MAXI GEL POST OP (GAUZE/BANDAGES/DRESSINGS) ×2 IMPLANT
YANKAUER SUCT BULB TIP 10FT TU (MISCELLANEOUS) ×3 IMPLANT

## 2019-04-03 NOTE — Anesthesia Procedure Notes (Signed)
Spinal  Patient location during procedure: OR Start time: 04/03/2019 12:00 PM End time: 04/03/2019 12:04 PM Staffing Anesthesiologist: Lyn Hollingshead, MD Performed: anesthesiologist  Preanesthetic Checklist Completed: patient identified, site marked, surgical consent, pre-op evaluation, timeout performed, IV checked, risks and benefits discussed and monitors and equipment checked Spinal Block Patient position: sitting Prep: site prepped and draped and DuraPrep Patient monitoring: continuous pulse ox and blood pressure Approach: midline Location: L3-4 Injection technique: single-shot Needle Needle type: Pencan  Needle gauge: 24 G Needle length: 10 cm Needle insertion depth: 6 cm Assessment Sensory level: T8

## 2019-04-03 NOTE — Progress Notes (Signed)
Assisted Dr. Hatchett with left, ultrasound guided, adductor canal block. Side rails up, monitors on throughout procedure. See vital signs in flow sheet. Tolerated Procedure well. 

## 2019-04-03 NOTE — Transfer of Care (Signed)
Immediate Anesthesia Transfer of Care Note  Patient: Carol Gilbert  Procedure(s) Performed: Left Knee Arthroplasty (Left )  Patient Location: PACU  Anesthesia Type:Spinal and MAC combined with regional for post-op pain  Level of Consciousness: awake, alert  and oriented  Airway & Oxygen Therapy: Patient Spontanous Breathing and Patient connected to face mask oxygen  Post-op Assessment: Report given to RN and Post -op Vital signs reviewed and stable  Post vital signs: Reviewed and stable  Last Vitals:  Vitals Value Taken Time  BP 142/130 04/03/19 1403  Temp    Pulse 61 04/03/19 1404  Resp 16 04/03/19 1404  SpO2 99 % 04/03/19 1404  Vitals shown include unvalidated device data.  Last Pain:  Vitals:   04/03/19 1145  TempSrc:   PainSc: 0-No pain      Patients Stated Pain Goal: 4 (20/81/38 8719)  Complications: No apparent anesthesia complications

## 2019-04-03 NOTE — Anesthesia Preprocedure Evaluation (Addendum)
Anesthesia Evaluation  Patient identified by MRN, date of birth, ID band Patient awake    Reviewed: Allergy & Precautions, NPO status , Patient's Chart, lab work & pertinent test results  Airway Mallampati: II       Dental no notable dental hx. (+) Teeth Intact   Pulmonary asthma ,    Pulmonary exam normal breath sounds clear to auscultation       Cardiovascular hypertension, Normal cardiovascular exam Rhythm:Regular Rate:Normal     Neuro/Psych PSYCHIATRIC DISORDERS Anxiety Depression    GI/Hepatic Neg liver ROS,   Endo/Other  diabetes  Renal/GU negative Renal ROS  negative genitourinary   Musculoskeletal   Abdominal (+) + obese,   Peds  Hematology negative hematology ROS (+)   Anesthesia Other Findings   Reproductive/Obstetrics                            Anesthesia Physical Anesthesia Plan  ASA: II  Anesthesia Plan: Spinal   Post-op Pain Management:  Regional for Post-op pain   Induction:   PONV Risk Score and Plan: 2 and Ondansetron and Dexamethasone  Airway Management Planned: Natural Airway, Nasal Cannula and Simple Face Mask  Additional Equipment:   Intra-op Plan:   Post-operative Plan:   Informed Consent: I have reviewed the patients History and Physical, chart, labs and discussed the procedure including the risks, benefits and alternatives for the proposed anesthesia with the patient or authorized representative who has indicated his/her understanding and acceptance.       Plan Discussed with: CRNA  Anesthesia Plan Comments:         Anesthesia Quick Evaluation

## 2019-04-03 NOTE — Anesthesia Postprocedure Evaluation (Signed)
Anesthesia Post Note  Patient: Carol Gilbert  Procedure(s) Performed: Left Knee Arthroplasty (Left )     Patient location during evaluation: PACU Anesthesia Type: Spinal Level of consciousness: awake Pain management: pain level controlled Vital Signs Assessment: post-procedure vital signs reviewed and stable Respiratory status: spontaneous breathing Cardiovascular status: stable Postop Assessment: no headache, no backache, spinal receding, patient able to bend at knees and no apparent nausea or vomiting Anesthetic complications: no    Last Vitals:  Vitals:   04/03/19 1445 04/03/19 1500  BP: (!) 144/80 (!) 149/89  Pulse: (!) 53 (!) 50  Resp: 14 13  Temp:    SpO2: 100% 99%    Last Pain:  Vitals:   04/03/19 1500  TempSrc:   PainSc: 0-No pain   Pain Goal: Patients Stated Pain Goal: 4 (04/03/19 1131)  LLE Motor Response: Purposeful movement (04/03/19 1500) LLE Sensation: No numbness, No tingling (04/03/19 1500) RLE Motor Response: Purposeful movement (04/03/19 1500) RLE Sensation: No numbness, No tingling (04/03/19 1500) L Sensory Level: S1-Sole of foot, small toes (04/03/19 1500) R Sensory Level: S1-Sole of foot, small toes (04/03/19 1500) Epidural/Spinal Function Patient able to flex knees: Yes (04/03/19 1500)  Huston Foley

## 2019-04-03 NOTE — Anesthesia Procedure Notes (Signed)
Anesthesia Regional Block: Adductor canal block   Pre-Anesthetic Checklist: ,, timeout performed, Correct Patient, Correct Site, Correct Laterality, Correct Procedure, Correct Position, site marked, Risks and benefits discussed,  Surgical consent,  Pre-op evaluation,  At surgeon's request and post-op pain management  Laterality: Lower and Left  Prep: chloraprep       Needles:  Injection technique: Single-shot  Needle Type: Echogenic Stimulator Needle     Needle Length: 10cm  Needle Gauge: 21   Needle insertion depth: 3 cm   Additional Needles:   Procedures:,,,, ultrasound used (permanent image in chart),,,,  Narrative:  Start time: 04/03/2019 11:35 AM End time: 04/03/2019 11:43 AM Injection made incrementally with aspirations every 5 mL. Anesthesiologist: Lyn Hollingshead, MD

## 2019-04-03 NOTE — Op Note (Signed)
PATIENT ID:      Mosetta Anisrma R Schmidt-Soltero  MRN:     [id number] DOB/AGE:    Feb 25, 1951 / 68 y.o.       OPERATIVE REPORT    DATE OF PROCEDURE:  04/03/2019       PREOPERATIVE DIAGNOSIS:   Left Knee Ostorarthritis      Estimated body mass index is 36.84 kg/m as calculated from the following:   Height as of this encounter: 5\' 1"  (1.549 m).   Weight as of this encounter: 88.5 kg.                                                        POSTOPERATIVE DIAGNOSIS:   Left Knee Ostorarthritis                                                                      PROCEDURE:  Procedure(s): Left Knee Arthroplasty Using DepuyAttune RP implants #6NL Femur, #5Tibia, 5 mm Attune RP bearing, 32 Patella     SURGEON: Nestor LewandowskyFrank J Axton Cihlar    ASSISTANT:   Tomi LikensEric K. [employer] PA-C   (Present and scrubbed throughout the case, critical for assistance with exposure, retraction, instrumentation, and closure.)         ANESTHESIA: Spinal, 20cc Exparel, 50cc 0.25% Marcaine  EBL: 300 cc  FLUID REPLACEMENT: 1600 cc crystaloid  Tourniquet Time: None  Drains: None  Tranexamic Acid: 1gm IV, 2gm topical  Exparel: 266mg    COMPLICATIONS:  None         INDICATIONS FOR PROCEDURE: The patient has  Left Knee Ostorarthritis, Var deformities, XR shows bone on bone arthritis, lateral subluxation of tibia. Patient has failed all conservative measures including anti-inflammatory medicines, narcotics, attempts at exercise and weight loss, cortisone injections and viscosupplementation.  Risks and benefits of surgery have been discussed, questions answered.   DESCRIPTION OF PROCEDURE: The patient identified by armband, received  IV antibiotics[facility] Patient taken to the operating room, appropriate anesthetic monitors were attached, and Spinal anesthesia was  induced. IV Tranexamic acid was given.Tourniquet applied high to the operative thigh. Lateral post and foot positioner applied to the table, the lower  extremity was then prepped and draped in usual sterile fashion from the toes to the tourniquet. Time-out procedure was performed. The skin and subcutaneous tissue along the incision was injected with 20 cc of a mixture of Exparel and Marcaine solution, using a 20-gauge by 1-1/2 inch needle. We began the operation, with the knee flexed 130 degrees, by making the anterior midline incision starting at handbreadth above the patella going over the patella 1 cm medial to and 4 cm distal to the tibial tubercle. Small bleeders in the skin and the subcutaneous tissue identified and cauterized. Transverse retinaculum was incised and reflected medially and a medial parapatellar arthrotomy was accomplished. the patella was everted and theprepatellar fat pad resected. The superficial medial collateral ligament was then elevated from anterior to posterior along the proximal flare of the tibia and anterior half of the menisci resected. The knee was hyperflexed exposing bone  on bone arthritis. Peripheral and notch osteophytes as well as the cruciate ligaments were then resected. We continued to work our way around posteriorly along the proximal tibia, and externally rotated the tibia subluxing it out from underneath the femur. A McHale retractor was placed through the notch and a lateral Hohmann retractor placed, and we then drilled through the proximal tibia in line with the axis of the tibia followed by an intramedullary guide rod and 2-degree posterior slope cutting guide. The tibial cutting guide, 4 degree posterior sloped, was pinned into place allowing resection of 2 mm of bone medially and 12 mm of bone laterally. Satisfied with the tibial resection, we then entered the distal femur 2 mm anterior to the PCL origin with the intramedullary guide rod and applied the distal femoral cutting guide set at 9 mm, with 5 degrees of valgus. This was pinned along the epicondylar axis. At this point, the distal femoral cut was  accomplished without difficulty. We then sized for a #5NL femoral component and pinned the guide in 3 degrees of external rotation. The chamfer cutting guide was pinned into place. The anterior, posterior, and chamfer cuts were accomplished without difficulty followed by the Attune RP box cutting guide and the box cut. We also removed posterior osteophytes from the posterior femoral condyles. The posterior capsule was injected with Exparel solution. The knee was brought into full extension. We checked our extension gap and fit a 5 mm bearing. Distracting in extension with a lamina spreader,  bleeders in the posterior capsule, Posterior medial and posterior lateral right down and cauterized.  The transexamic acid-soaked sponge was then placed in the gap of the knee and extension. The knee was flexed 30. The posterior patella cut was accomplished with the 9.5 mm Attune cutting guide, sized for a 32mm dome, and the fixation pegs drilled.The knee was then once again hyperflexed exposing the proximal tibia. We sized for a # 5 tibial base plate, applied the smokestack and the conical reamer followed by the the Delta fin keel punch. We then hammered into place the Attune RP trial femoral component, drilled the lugs, inserted a  5 mm trial bearing, trial patellar button, and took the knee through range of motion from 0-130 degrees. Medial and lateral ligamentous stability was checked. No thumb pressure was required for patellar Tracking. The tourniquet was inflated to hg for 13 min. All trial components were removed, mating surfaces irrigated with pulse lavage, and dried with suction and sponges. 10 cc of the Exparel solution was applied to the cancellus bone of the patella distal femur and proximal tibia.  After waiting 30 seconds, the bony surfaces were again, dried with sponges. A double batch of DePuy HV cement was mixed and applied to all bony metallic mating surfaces except for the posterior condyles of the  femur itself. In order, we hammered into place the tibial tray and removed excess cement, the femoral component and removed excess cement. The final Attune RP bearing was inserted, and the knee brought to full extension with compression. The patellar button was clamped into place, and excess cement removed. The knee was held at 30 flexion with compression, while the cement cured. The wound was irrigated out with normal saline solution pulse lavage. The rest of the Exparel was injected into the parapatellar arthrotomy, subcutaneous tissues, and periosteal tissues. The parapatellar arthrotomy was closed with running #1 Vicryl suture. The subcutaneous tissue with 0 and 2-0 undyed Vicryl suture, and the skin with running 3-0  SQ vicryl. An Aquacil and Ace wrap were applied. The patient was taken to recovery room without difficulty.   Kerin Salen 04/03/2019, 1:34 PM

## 2019-04-03 NOTE — Discharge Instructions (Signed)

## 2019-04-03 NOTE — Interval H&P Note (Signed)
History and Physical Interval Note:  04/03/2019 11:07 AM  Carol Gilbert  has presented today for surgery, with the diagnosis of Left Knee Ostorarthritis.  The various methods of treatment have been discussed with the patient and family. After consideration of risks, benefits and other options for treatment, the patient has consented to  Procedure(s): Left Knee Arthroplasty (Left) as a surgical intervention.  The patient's history has been reviewed, patient examined, no change in status, stable for surgery.  I have reviewed the patient's chart and labs.  Questions were answered to the patient's satisfaction.     Kerin Salen

## 2019-04-03 NOTE — Anesthesia Procedure Notes (Signed)
Procedure Name: MAC Date/Time: 04/03/2019 12:02 PM Performed by: West Pugh, CRNA Pre-anesthesia Checklist: Patient identified, Emergency Drugs available, Suction available, Patient being monitored and Timeout performed Patient Re-evaluated:Patient Re-evaluated prior to induction Oxygen Delivery Method: Simple face mask Preoxygenation: Pre-oxygenation with 100% oxygen Induction Type: IV induction Placement Confirmation: positive ETCO2 Dental Injury: Teeth and Oropharynx as per pre-operative assessment

## 2019-04-04 ENCOUNTER — Encounter (HOSPITAL_COMMUNITY): Payer: Self-pay | Admitting: Orthopedic Surgery

## 2019-04-04 DIAGNOSIS — Z91013 Allergy to seafood: Secondary | ICD-10-CM | POA: Diagnosis not present

## 2019-04-04 DIAGNOSIS — J449 Chronic obstructive pulmonary disease, unspecified: Secondary | ICD-10-CM | POA: Diagnosis not present

## 2019-04-04 DIAGNOSIS — E119 Type 2 diabetes mellitus without complications: Secondary | ICD-10-CM | POA: Diagnosis not present

## 2019-04-04 DIAGNOSIS — D62 Acute posthemorrhagic anemia: Secondary | ICD-10-CM | POA: Diagnosis not present

## 2019-04-04 DIAGNOSIS — Z6836 Body mass index (BMI) 36.0-36.9, adult: Secondary | ICD-10-CM | POA: Diagnosis not present

## 2019-04-04 DIAGNOSIS — M797 Fibromyalgia: Secondary | ICD-10-CM | POA: Diagnosis not present

## 2019-04-04 DIAGNOSIS — F419 Anxiety disorder, unspecified: Secondary | ICD-10-CM | POA: Diagnosis not present

## 2019-04-04 DIAGNOSIS — M1712 Unilateral primary osteoarthritis, left knee: Secondary | ICD-10-CM | POA: Diagnosis not present

## 2019-04-04 DIAGNOSIS — I10 Essential (primary) hypertension: Secondary | ICD-10-CM | POA: Diagnosis not present

## 2019-04-04 DIAGNOSIS — F329 Major depressive disorder, single episode, unspecified: Secondary | ICD-10-CM | POA: Diagnosis not present

## 2019-04-04 DIAGNOSIS — Z7951 Long term (current) use of inhaled steroids: Secondary | ICD-10-CM | POA: Diagnosis not present

## 2019-04-04 DIAGNOSIS — E669 Obesity, unspecified: Secondary | ICD-10-CM | POA: Diagnosis not present

## 2019-04-04 LAB — BASIC METABOLIC PANEL
Anion gap: 6 (ref 5–15)
BUN: 14 mg/dL (ref 8–23)
CO2: 28 mmol/L (ref 22–32)
Calcium: 9.1 mg/dL (ref 8.9–10.3)
Chloride: 101 mmol/L (ref 98–111)
Creatinine, Ser: 0.56 mg/dL (ref 0.44–1.00)
GFR calc Af Amer: >60 mL/min (ref >60)
GFR calc non Af Amer: >60 mL/min (ref >60)
Glucose, Bld: 160 mg/dL — ABNORMAL HIGH (ref 70–99)
Potassium: 4.2 mmol/L (ref 3.5–5.1)
Sodium: 135 mmol/L (ref 135–145)

## 2019-04-04 LAB — CBC
HCT: 37.4 % (ref 36.0–46.0)
Hemoglobin: 11.8 g/dL — ABNORMAL LOW (ref 12.0–15.0)
MCH: 29.9 pg (ref 26.0–34.0)
MCHC: 31.6 g/dL (ref 30.0–36.0)
MCV: 94.9 fL (ref 80.0–100.0)
Platelets: 210 10*3/uL (ref 150–400)
RBC: 3.94 MIL/uL (ref 3.87–5.11)
RDW: 13.4 % (ref 11.5–15.5)
WBC: 10.7 10*3/uL — ABNORMAL HIGH (ref 4.0–10.5)
nRBC: 0 % (ref 0.0–0.2)

## 2019-04-04 NOTE — Evaluation (Signed)
Physical Therapy Evaluation Patient Details Name: Carol Gilbert MRN: [id number] DOB: 1951/05/02 Today's Date: 04/04/2019   History of Present Illness  Carol Gilbert, 68 y.o. female, has a history of pain and functional disability in the left knee. Pt s/p L TKR.  Clinical Impression  Pt presents with dependencies in mobility affecting her independence secondary to L TKR. Pt reports decreased UE strength due to arthritis. Pt was able to ambulate to the bathroom very slowly. Pt had difficulty with pain during gait. Initiated L TKR exercises with mod assist. Pt will need continued skilled PT to maximize mobility and Independence for return home. Pt will receive BID treatment today, but anticipate pt may need another day of therapy prior to d/c.    Follow Up Recommendations Follow surgeon's recommendation for DC plan and follow-up therapies;Supervision for mobility/OOB    Equipment Recommendations  3in1 (PT)    Recommendations for Other Services       Precautions / Restrictions Precautions Precautions: Knee;Fall Restrictions Other Position/Activity Restrictions: WBAT      Mobility  Bed Mobility Overal bed mobility: Needs Assistance Bed Mobility: Supine to Sit     Supine to sit: Min assist     General bed mobility comments: instructional cues, assistance lifting L LE to edge of bed  Transfers Overall transfer level: Needs assistance Equipment used: Rolling walker (2 wheeled) Transfers: Sit to/from Stand Sit to Stand: Mod assist         General transfer comment: cues for hand placement, mod assist from Commode, min assist from bed  Ambulation/Gait Ambulation/Gait assistance: Min assist Gait Distance (Feet): 12 Feet(12 ft times 2) Assistive device: Rolling walker (2 wheeled) Gait Pattern/deviations: Step-to pattern;Decreased stride length;Decreased weight shift to left;Decreased step length - right Gait velocity: slow   General Gait Details: very slow,  increased pain with every step, max cues for safety and technique, max encouragement  Stairs            Wheelchair Mobility    Modified Rankin (Stroke Patients Only)       Balance Overall balance assessment: Needs assistance Sitting-balance support: No upper extremity supported Sitting balance-Leahy Scale: Good     Standing balance support: Bilateral upper extremity supported Standing balance-Leahy Scale: Fair                               Pertinent Vitals/Pain Pain Assessment: 0-10 Pain Score: 8  Pain Location: L Knee    Home Living Family/patient expects to be discharged to:: Private residence Living Arrangements: Children Available Help at Discharge: Family Type of Home: House Home Access: Level entry     Home Layout: One level Home Equipment: Environmental consultantWalker - 2 wheels      Prior Function Level of Independence: Independent               Hand Dominance        Extremity/Trunk Assessment   Upper Extremity Assessment Upper Extremity Assessment: Defer to OT evaluation    Lower Extremity Assessment Lower Extremity Assessment: LLE deficits/detail LLE Deficits / Details: knee, hip 2/5 LLE: Unable to fully assess due to pain       Communication   Communication: No difficulties  Cognition Arousal/Alertness: Awake/alert Behavior During Therapy: WFL for tasks assessed/performed Overall Cognitive Status: Within Functional Limits for tasks assessed  General Comments General comments (skin integrity, edema, etc.): pain meds given prior to session, however pain limited treatment    Exercises Total Joint Exercises Ankle Circles/Pumps: AROM;Strengthening;Both;10 reps;Supine Quad Sets: AROM;Strengthening;Left;10 reps;Supine Heel Slides: AAROM;Strengthening;Left;5 reps;Seated Hip ABduction/ADduction: AAROM;Strengthening;Left;Seated;5 reps Straight Leg Raises: AAROM;Strengthening;Left;5  reps;Seated   Assessment/Plan    PT Assessment Patient needs continued PT services  PT Problem List Decreased strength;Decreased mobility;Decreased safety awareness;Decreased range of motion;Decreased activity tolerance;Decreased balance;Pain       PT Treatment Interventions DME instruction;Therapeutic activities;Gait training;Therapeutic exercise;Patient/family education;Balance training;Functional mobility training    PT Goals (Current goals can be found in the Care Plan section)  Acute Rehab PT Goals Patient Stated Goal: To walk to the bathroom PT Goal Formulation: With patient Time For Goal Achievement: 04/11/19 Potential to Achieve Goals: Good    Frequency 7X/week   Barriers to discharge        Co-evaluation               AM-PAC PT "6 Clicks" Mobility  Outcome Measure Help needed turning from your back to your side while in a flat bed without using bedrails?: A Little Help needed moving from lying on your back to sitting on the side of a flat bed without using bedrails?: A Little Help needed moving to and from a bed to a chair (including a wheelchair)?: A Little Help needed standing up from a chair using your arms (e.g., wheelchair or bedside chair)?: A Lot Help needed to walk in hospital room?: A Little Help needed climbing 3-5 steps with a railing? : A Lot 6 Click Score: 16    End of Session Equipment Utilized During Treatment: Gait belt Activity Tolerance: Patient limited by pain Patient left: in chair;with call bell/phone within reach Nurse Communication: Mobility status PT Visit Diagnosis: Difficulty in walking, not elsewhere classified (R26.2);Pain Pain - Right/Left: Left Pain - part of body: Knee    Time: 3716-9678 PT Time Calculation (min) (ACUTE ONLY): 46 min   Charges:   PT Evaluation $PT Eval Low Complexity: 1 Low PT Treatments $Gait Training: 8-22 mins $Therapeutic Exercise: 8-22 mins        Theodoro Grist, PT  Lelon Mast 04/04/2019, 9:34 AM

## 2019-04-04 NOTE — Progress Notes (Signed)
Physical Therapy Treatment Patient Details Name: Carol Gilbert MRN: [id number] DOB: [birth year] Today's Date: 04/04/2019    History of Present Illness Carol Gilbert, 68 y.o. female, has a history of pain and functional disability in the left knee. Pt s/p L TKR.    PT Comments    Pt is making slow, but steady progress with mobility. Pt was able to advance R foot easier this afternoon. Pt's daughter was present for therapy and observed the level of assistance she needs with mobility. Pt is not ready for d/c home today. Pt is requiring mod assist to stand and needs reinforcement with mobility to safely d/c home. Anticipate pt may only need QD treatment and then d/c. Pt will continue to benefit from acute skilled PT to maximize mobility and safety for return home with family providing assistance,   Follow Up Recommendations  Follow surgeon's recommendation for DC plan and follow-up therapies;Supervision for mobility/OOB     Equipment Recommendations  3in1 (PT)    Recommendations for Other Services       Precautions / Restrictions Precautions Precautions: Fall Restrictions Other Position/Activity Restrictions: WBAT    Mobility  Bed Mobility Overal bed mobility: Needs Assistance Bed Mobility: Sit to Supine       Sit to supine: Min assist   General bed mobility comments: instructional cues, assistance lifting L LE onto the bed  Transfers Overall transfer level: Needs assistance Equipment used: Rolling walker (2 wheeled) Transfers: Sit to/from Stand Sit to Stand: Mod assist         General transfer comment: cues for hand placement and increasing forward trunk flexion  Ambulation/Gait Ambulation/Gait assistance: Min guard Gait Distance (Feet): 50 Feet Assistive device: Rolling walker (2 wheeled) Gait Pattern/deviations: Step-to pattern;Decreased stride length;Decreased weight shift to left;Decreased step length - right Gait velocity: slow   General  Gait Details: cues for increasing r step length, upright posture and sequencing   Stairs             Wheelchair Mobility    Modified Rankin (Stroke Patients Only)       Balance Overall balance assessment: Needs assistance Sitting-balance support: No upper extremity supported Sitting balance-Leahy Scale: Good     Standing balance support: Bilateral upper extremity supported Standing balance-Leahy Scale: Fair                              Cognition Arousal/Alertness: Awake/alert Behavior During Therapy: WFL for tasks assessed/performed Overall Cognitive Status: Within Functional Limits for tasks assessed                                        Exercises Total Joint Exercises Ankle Circles/Pumps: AROM;Strengthening;Both;10 reps;Supine Quad Sets: AROM;Left;10 reps;Supine Hip ABduction/ADduction: AAROM;Strengthening;Left;10 reps;Supine Straight Leg Raises: AAROM;Strengthening;Left;10 reps;Supine Long Arc Quad: AAROM;Strengthening;Left;10 reps;Seated Knee Flexion: AAROM;Strengthening;Left;10 reps;Seated Goniometric ROM: 70    General Comments General comments (skin integrity, edema, etc.): pt's daughter was present for treatment. The daughter observed how to assist with bed mobility, transfers, gait, and LE exercises.      Pertinent Vitals/Pain Pain Assessment: 0-10 Pain Score: 5  Pain Location: L Knee Pain Descriptors / Indicators: Discomfort Pain Intervention(s): Limited activity within patient's tolerance;Monitored during session    Home Living                      Prior  Function            PT Goals (current goals can now be found in the care plan section) Progress towards PT goals: Progressing toward goals    Frequency    7X/week      PT Plan Current plan remains appropriate    Co-evaluation              AM-PAC PT "6 Clicks" Mobility   Outcome Measure  Help needed turning from your back to your side  while in a flat bed without using bedrails?: A Little Help needed moving from lying on your back to sitting on the side of a flat bed without using bedrails?: A Little Help needed moving to and from a bed to a chair (including a wheelchair)?: A Little Help needed standing up from a chair using your arms (e.g., wheelchair or bedside chair)?: A Little Help needed to walk in hospital room?: A Little Help needed climbing 3-5 steps with a railing? : A Lot 6 Click Score: 17    End of Session Equipment Utilized During Treatment: Gait belt Activity Tolerance: Patient tolerated treatment well Patient left: in bed;with call bell/phone within reach;with family/visitor present Nurse Communication: Mobility status PT Visit Diagnosis: Difficulty in walking, not elsewhere classified (R26.2);Pain Pain - Right/Left: Left Pain - part of body: Knee     Time: [id number] PT Time Calculation (min) (ACUTE ONLY): 25 min  Charges:  $Gait Training: 8-22 mins $Therapeutic Exercise: 8-22 mins                     Lilyan PuntHeidi Emmabelle Fear, PT   Greggory StallionWrisley, Emmory Solivan Kerstine 04/04/2019, 1:59 PM

## 2019-04-04 NOTE — Progress Notes (Signed)
PATIENT ID: Carol Gilbert  MRN: [id number]  DOB/AGE:  1951-06-05 / 68 y.o.  1 Day Post-Op Procedure(s) (LRB): Left Knee Arthroplasty (Left)    PROGRESS NOTE Subjective: Patient is alert, oriented, No Nausea, No Vomiting, yes passing gas. Taking PO Well. Denies SOB, Chest or Calf Pain. Using Incentive Spirometer, PAS in place. Ambulate Not accomplished yesterday patient was on bed hold in the PACU., Patient reports pain as For/10 .    Objective: Vital signs in last 24 hours: Vitals:   04/03/19 2300 04/04/19 0106 04/04/19 0446 04/04/19 0757  BP: (Abnormal) 166/89 (Abnormal) 144/80 (Abnormal) 154/81   Pulse:  62 63   Resp:  16 16   Temp:  98.1 F (36.7 C) 98 F (36.7 C)   TempSrc:      SpO2:  99% 99% 95%  Weight:      Height:          Intake/Output from previous day: I/O last 3 completed shifts: In: 1300 [I.V.:1300] Out: 3525 [Urine:3425; Blood:100]   Intake/Output this shift: No intake/output data recorded.   LABORATORY DATA: Recent Labs    04/03/19 1035 04/03/19 1417 04/04/19 0244  WBC  --   --  10.7*  HGB  --   --  11.8*  HCT  --   --  37.4  PLT  --   --  210  NA  --   --  135  K  --   --  4.2  CL  --   --  101  CO2  --   --  28  BUN  --   --  14  CREATININE  --   --  0.56  GLUCOSE  --   --  160*  GLUCAP 87 80  --   CALCIUM  --   --  9.1    Examination: Neurologically intact ABD soft Neurovascular intact Sensation intact distally Intact pulses distally Dorsiflexion/Plantar flexion intact Incision: dressing C/D/I No cellulitis present Compartment soft}  Assessment:   1 Day Post-Op Procedure(s) (LRB): Left Knee Arthroplasty (Left) ADDITIONAL DIAGNOSIS: Expected Acute Blood Loss Anemia, asthma  Patient's anticipated LOS is less than 2 midnights, meeting these requirements: - Lives within 1 hour of care - Has a competent adult at home to recover with post-op recover - NO history of  - Chronic pain requiring opiods  - Diabetes  -  Coronary Artery Disease  - Heart failure  - Heart attack  - Stroke  - DVT/VTE  - Cardiac arrhythmia  - Respiratory Failure/COPD  - Renal failure  - Anemia  - Advanced Liver disease       Plan: PT/OT WBAT, AROM and PROM  DVT Prophylaxis:  SCDx72hrs, ASA 81 mg BID x 2 weeks DISCHARGE PLAN: Home,Possibly today if she passes physical therapy DISCHARGE NEEDS: HHPT, Walker and 3-in-1 comode seat     Kerin Salen 04/04/2019, 8:09 AM Patient ID: Carol Gilbert, female   DOB: 1950-10-07, 68 y.o.   MRN: [id number]

## 2019-04-05 LAB — CBC
HCT: 39.3 % (ref 36.0–46.0)
Hemoglobin: 12.3 g/dL (ref 12.0–15.0)
MCH: 29.6 pg (ref 26.0–34.0)
MCHC: 31.3 g/dL (ref 30.0–36.0)
MCV: 94.5 fL (ref 80.0–100.0)
Platelets: 240 10*3/uL (ref 150–400)
RBC: 4.16 MIL/uL (ref 3.87–5.11)
RDW: 13.5 % (ref 11.5–15.5)
WBC: 12.7 10*3/uL — ABNORMAL HIGH (ref 4.0–10.5)
nRBC: 0 % (ref 0.0–0.2)

## 2019-04-05 NOTE — Discharge Summary (Signed)
Patient ID: Carol Gilbert MRN: [id number] DOB/AGE: 1951/03/17 [age] y.o.  Admit date: 04/03/2019 Discharge date: 04/05/2019  Admission Diagnoses:  Principal Problem:   Degenerative arthritis of left knee Active Problems:   Total knee replacement status, left   Discharge Diagnoses:  Same  Past Medical History:  Diagnosis Date  . Apnea   . Arthritis   . Asthma   . Diabetes mellitus without complication (HCC)   . Diverticulitis   . Fibromyalgia   . Hypertension     Surgeries: Procedure(s): Left Knee Arthroplasty on 04/03/2019   Consultants:   Discharged Condition: Improved  Hospital Course: Carol Gilbert is an 68 y.o. female who was admitted 04/03/2019 for operative treatment ofDegenerative arthritis of left knee. Patient has severe unremitting pain that affects sleep, daily activities, and work/hobbies. After pre-op clearance the patient was taken to the operating room on 04/03/2019 and underwent  Procedure(s): Left Knee Arthroplasty.    Patient was given perioperative antibiotics:  Anti-infectives (From admission, onward)   Start     Dose/Rate Route Frequency Ordered Stop   04/03/19 1000  ceFAZolin (ANCEF) IVPB 2g/100 mL premix     2 g 200 mL/hr over 30 Minutes Intravenous On call to O.R. 04/03/19 [id number] 04/03/19 1235       Patient was given sequential compression devices, early ambulation, and chemoprophylaxis to prevent DVT.  Patient benefited maximally from hospital stay and there were no complications.    Recent vital signs:  Patient Vitals for the past 24 hrs:  BP Temp Temp src Pulse Resp SpO2  04/05/19 0803 - - - - - 99 %  04/05/19 0800 - - - - - 99 %  04/05/19 0626 122/60 98.7 F (37.1 C) Oral 80 17 99 %  04/04/19 2158 - - - - - 95 %  04/04/19 2100 (!) 164/79 98.2 F (36.8 C) Oral 79 17 98 %  04/04/19 1400 140/86 - - - - -     Recent laboratory studies:  Recent Labs    04/04/19 0244 04/05/19 0258  WBC 10.7* 12.7*  HGB 11.8* 12.3  HCT  37.4 39.3  PLT 210 240  NA 135  --   K 4.2  --   CL 101  --   CO2 28  --   BUN 14  --   CREATININE 0.56  --   GLUCOSE 160*  --   CALCIUM 9.1  --      Discharge Medications:   Allergies as of 04/05/2019      Reactions   Shellfish Allergy Anaphylaxis   Pollen Extract Other (See Comments)   Dust and smoke/ causes watery eyes; sneezing      Medication List    TAKE these medications   albuterol 108 (90 Base) MCG/ACT inhaler Commonly known as: VENTOLIN HFA Inhale 2 puffs into the lungs every 6 (six) hours as needed for wheezing or shortness of breath.   aspirin EC 81 MG tablet Take 1 tablet (81 mg total) by mouth 2 (two) times daily.   b complex vitamins tablet Take 1 tablet by mouth every evening.   Biotin 5000 MCG Tabs Take 15,000 mcg by mouth every evening.   bisacodyl 5 MG EC tablet Commonly known as: DULCOLAX Take 5 mg by mouth 2 (two) times daily as needed for moderate constipation (constipation.).   CITRACAL +D3 PO Take 1 tablet by mouth every evening.   Citrucel 500 MG Tabs Generic drug: Methylcellulose (Laxative) Take 500 mg by mouth 2 (two) times a  day. Morning & Evening   docusate sodium 100 MG capsule Commonly known as: COLACE Take 300 mg by mouth at bedtime.   DULoxetine 60 MG capsule Commonly known as: CYMBALTA Take 60 mg by mouth daily.   fluticasone 50 MCG/ACT nasal spray Commonly known as: FLONASE Place 2 sprays into both nostrils daily. What changed:   when to take this  reasons to take this   KLS Aller-Tec 10 MG tablet Generic drug: cetirizine Take 10 mg by mouth daily.   levalbuterol 45 MCG/ACT inhaler Commonly known as: XOPENEX HFA Inhale 1-2 puffs into the lungs every 4 (four) hours as needed for wheezing.   montelukast 10 MG tablet Commonly known as: SINGULAIR Take 10 mg by mouth daily.   Mucinex Maximum Strength 1200 MG Tb12 Generic drug: Guaifenesin Take 1,200 mg by mouth every evening.   multivitamin with minerals Tabs  tablet Take 1 tablet by mouth daily. One-A-Day Women's   oxyCODONE-acetaminophen 5-325 MG tablet Commonly known as: PERCOCET/ROXICET Take 1 tablet by mouth every 4 (four) hours as needed for severe pain.   saccharomyces boulardii 250 MG capsule Commonly known as: FLORASTOR Take 250 mg by mouth 2 (two) times daily.   STOOL SOFTENER PO Take by mouth as needed.   Symbicort 160-4.5 MCG/ACT inhaler Generic drug: budesonide-formoterol Inhale 2 puffs into the lungs 2 (two) times a day.   tiZANidine 2 MG tablet Commonly known as: ZANAFLEX Take 1 tablet (2 mg total) by mouth every 6 (six) hours as needed.   vitamin C 1000 MG tablet Take 1,000 mg by mouth daily.   Vitamin D3 50 MCG (2000 UT) Tabs Take 2,000 Units by mouth daily.            Durable Medical Equipment  (From admission, onward)         Start     Ordered   04/03/19 1727  DME Walker rolling  Once    Question:  Patient needs a walker to treat with the following condition  Answer:  Status post total left knee replacement   04/03/19 1728   04/03/19 1727  DME 3 n 1  Once     04/03/19 1728           Discharge Care Instructions  (From admission, onward)         Start     Ordered   04/05/19 0000  Weight bearing as tolerated     04/05/19 [id number]          Diagnostic Studies: Dg Chest 2 View  Result Date: 03/31/2019 CLINICAL DATA:  Preoperative exam for knee replacement. Hx of asthma EXAM: CHEST - 2 VIEW COMPARISON:  CT chest 10/24/2018 FINDINGS: Mildly enlarged heart size. Tortuous thoracic aorta with atherosclerotic calcification. Minimal bibasilar linear opacities likely representing scarring. No new focal infiltrate. No pneumothorax or pleural effusion. Scoliosis. IMPRESSION: No active cardiopulmonary disease. Electronically Signed   By: Emmaline KluverNancy  Ballantyne M.D.   On: 03/31/2019 13:33    Disposition: Discharge disposition: 01-Home or Self Care       Discharge Instructions    Call MD / Call 911    Complete by: As directed    If you experience chest pain or shortness of breath, CALL 911 and be transported to the hospital emergency room.  If you develope a fever above 101 F, pus (white drainage) or increased drainage or redness at the wound, or calf pain, call your surgeon's office.   Constipation Prevention   Complete by: As directed  Drink plenty of fluids.  Prune juice may be helpful.  You may use a stool softener, such as Colace (over the counter) 100 mg twice a day.  Use MiraLax (over the counter) for constipation as needed.   Diet - low sodium heart healthy   Complete by: As directed    Driving restrictions   Complete by: As directed    No driving for 2 weeks   Increase activity slowly as tolerated   Complete by: As directed    Patient may shower   Complete by: As directed    You may shower without a dressing once there is no drainage.  Do not wash over the wound.  If drainage remains, cover wound with plastic wrap and then shower.   Weight bearing as tolerated   Complete by: As directed       Follow-up Information    Frederik Pear, MD In 2 weeks.   Specialty: Orthopedic Surgery Contact information: [place] [address] [phone]            Signed: Joanell Rising 04/05/2019, 8:33 AM

## 2019-04-05 NOTE — Progress Notes (Signed)
Patient has been given discharge instructions, belongings packed. Patient has informed her family member that she is ready to be picked up, and patient has been wheeled downstairs.

## 2019-04-05 NOTE — Plan of Care (Signed)
Patient getting ready for discharge

## 2019-04-05 NOTE — TOC Transition Note (Signed)
Transition of Care [facility]) - CM/SW Discharge Note   Patient Details  Name: Carol Gilbert MRN: [id number] Date of Birth: 08-Jul-1951  [facility] CM/SW Contact:  Leeroy Cha, RN Phone Number: 04/05/2019, 10:23 AM   Clinical Narrative:     dcd to home with hhc  Final next level of care: Home w Home Health Services Barriers to Discharge: No Barriers Identified   Patient Goals and CMS Choice        Discharge Placement                       Discharge Plan and Services   Discharge Planning Services: CM Consult Post Acute Care Choice: Home Health, Durable Medical Equipment          DME Arranged: Walker rolling, 3-N-1 DME Agency: [employer] Date DME Agency Contacted: 04/04/19 Time DME Agency Contacted: 0900 Representative spoke with at DME Agency: nathan HH Arranged: PT Alliance: [facility] (formerly [employer]) Date [place]: 04/04/19 Time [place]: 0900 Representative spoke with at [place]: mike  Social Determinants of Health ([place]) Interventions     Readmission Risk Interventions No flowsheet data found.

## 2019-04-05 NOTE — Progress Notes (Signed)
Physical Therapy Treatment Patient Details Name: Carol Gilbert MRN: [id number] DOB: 1951/03/12 Today's Date: 04/05/2019    History of Present Illness Nathan R Schmidt-Soltero, 68 y.o. female, has a history of pain and functional disability in the left knee. Pt s/p L TKR.    PT Comments    Pt has met PT goals for mobility. Pt is ambulating 125 feet with supervision with RW, completed step training with min assist, and reinforced HEP. Pt agrees with d/c plan of home with daughter assisting as needed. Pt will continue with HHPT per ortho recommendations. Pt is ready for d/c home.  Follow Up Recommendations  Follow surgeon's recommendation for DC plan and follow-up therapies;Supervision for mobility/OOB     Equipment Recommendations  3in1 (PT)    Recommendations for Other Services       Precautions / Restrictions Precautions Precautions: Knee;Fall Restrictions Other Position/Activity Restrictions: WBAT    Mobility  Bed Mobility Overal bed mobility: Needs Assistance Bed Mobility: Supine to Sit     Supine to sit: Min guard     General bed mobility comments: cues for technique, demonstrated leg hook technique for increased independence and dec pain.  Transfers Overall transfer level: Needs assistance Equipment used: Rolling walker (2 wheeled) Transfers: Sit to/from [facility] Sit to Stand: Supervision Stand pivot transfers: Supervision       General transfer comment: cues for hand placement and increasing forward trunk flexion  Ambulation/Gait Ambulation/Gait assistance: Supervision Gait Distance (Feet): 125 Feet Assistive device: Rolling walker (2 wheeled) Gait Pattern/deviations: Step-to pattern;Decreased stride length;Decreased weight shift to left;Decreased step length - right Gait velocity: decreased   General Gait Details: cues for increasing r step length, upright posture and sequencing   Stairs Stairs: Yes Stairs assistance: Min  assist Stair Management: Two rails;Forwards Number of Stairs: 3     Wheelchair Mobility    Modified Rankin (Stroke Patients Only)       Balance Overall balance assessment: Needs assistance Sitting-balance support: No upper extremity supported Sitting balance-Leahy Scale: Good     Standing balance support: Bilateral upper extremity supported Standing balance-Leahy Scale: Fair                              Cognition Arousal/Alertness: Awake/alert Behavior During Therapy: WFL for tasks assessed/performed Overall Cognitive Status: Within Functional Limits for tasks assessed                                        Exercises Total Joint Exercises Ankle Circles/Pumps: AROM;Strengthening;Both;10 reps;Supine Quad Sets: AROM;Left;10 reps;Supine Heel Slides: AAROM;Strengthening;Left;Seated;10 reps Hip ABduction/ADduction: AAROM;Strengthening;Left;10 reps;Supine Straight Leg Raises: AAROM;Strengthening;Left;10 reps;Supine Long Arc Quad: Strengthening;Left;10 reps;Seated;AROM Knee Flexion: AAROM;Strengthening;Left;10 reps;Seated Goniometric ROM: 80    General Comments General comments (skin integrity, edema, etc.): HEP handout given      Pertinent Vitals/Pain Pain Assessment: Faces Faces Pain Scale: Hurts a little bit Pain Location: L Knee Pain Descriptors / Indicators: Discomfort Pain Intervention(s): Limited activity within patient's tolerance;Monitored during session    Home Living                      Prior Function            PT Goals (current goals can now be found in the care plan section) Progress towards PT goals: Progressing toward goals    Frequency  7X/week      PT Plan Current plan remains appropriate    Co-evaluation              AM-PAC PT "6 Clicks" Mobility   Outcome Measure  Help needed turning from your back to your side while in a flat bed without using bedrails?: A Little Help needed moving  from lying on your back to sitting on the side of a flat bed without using bedrails?: A Little Help needed moving to and from a bed to a chair (including a wheelchair)?: A Little Help needed standing up from a chair using your arms (e.g., wheelchair or bedside chair)?: A Little Help needed to walk in hospital room?: A Little Help needed climbing 3-5 steps with a railing? : A Little 6 Click Score: 18    End of Session Equipment Utilized During Treatment: Gait belt Activity Tolerance: Patient tolerated treatment well Patient left: in bed;with call bell/phone within reach;with family/visitor present Nurse Communication: Mobility status PT Visit Diagnosis: Difficulty in walking, not elsewhere classified (R26.2);Pain Pain - Right/Left: Left Pain - part of body: Knee     Time: 6010-9323 PT Time Calculation (min) (ACUTE ONLY): 35 min  Charges:  $Gait Training: 8-22 mins $Therapeutic Exercise: 8-22 mins                     Theodoro Grist, PT   Carol Gilbert 04/05/2019, 9:54 AM

## 2019-04-05 NOTE — Progress Notes (Signed)
PATIENT ID: Carol Gilbert  MRN: [id number]  DOB/AGE:  [birth year] / 69 y.o.  2 Days Post-Op Procedure(s) (LRB): Left Knee Arthroplasty (Left)    PROGRESS NOTE Subjective: Patient is alert, oriented, no Nausea, no Vomiting, yes passing gas. Taking PO well. Denies SOB, Chest or Calf Pain. Using Incentive Spirometer, PAS in place. Ambulate WBAT with pt walking 50 ft with therapy, Patient reports pain as moderate.    Objective: Vital signs in last 24 hours: Vitals:   04/04/19 2158 04/05/19 0626 04/05/19 0800 04/05/19 0803  BP:  122/60    Pulse:  80    Resp:  17    Temp:  98.7 F (37.1 C)    TempSrc:  Oral    SpO2: 95% 99% 99% 99%  Weight:      Height:          Intake/Output from previous day: I/O last 3 completed shifts: In: 3897.1 [P.O.:1320; I.V.:2577.1] Out: 0539 [Urine:3475]   Intake/Output this shift: No intake/output data recorded.   LABORATORY DATA: Recent Labs    04/03/19 1035 04/03/19 1417 04/04/19 0244 04/05/19 0258  WBC  --   --  10.7* 12.7*  HGB  --   --  11.8* 12.3  HCT  --   --  37.4 39.3  PLT  --   --  210 240  NA  --   --  135  --   K  --   --  4.2  --   CL  --   --  101  --   CO2  --   --  28  --   BUN  --   --  14  --   CREATININE  --   --  0.56  --   GLUCOSE  --   --  160*  --   GLUCAP 87 80  --   --   CALCIUM  --   --  9.1  --     Examination: Neurologically intact Neurovascular intact Sensation intact distally Intact pulses distally Dorsiflexion/Plantar flexion intact Incision: dressing C/D/I and no drainage No cellulitis present Compartment soft}  Assessment:   2 Days Post-Op Procedure(s) (LRB): Left Knee Arthroplasty (Left) ADDITIONAL DIAGNOSIS: Expected Acute Blood Loss Anemia, asthma Anticipated LOS equal to or greater than 2 midnights due to - Age 39 and older with one or more of the following:  - Obesity  - Expected need for hospital services (PT, OT, Nursing) required for safe  discharge  - Anticipated need for  postoperative skilled nursing care or inpatient rehab  OR   - Unanticipated findings during/Post Surgery: Slow post-op progression: GI, pain control, mobility      Plan: PT/OT WBAT, AROM and PROM  DVT Prophylaxis:  SCDx72hrs, ASA 81 mg BID x 2 weeks DISCHARGE PLAN: Home, today once she meets therapy goals DISCHARGE NEEDS: HHPT, Walker and 3-in-1 comode seat     Joanell Rising 04/05/2019, 8:19 AM

## 2019-04-18 ENCOUNTER — Ambulatory Visit: Payer: Medicare Other | Attending: Orthopedic Surgery | Admitting: Physical Therapy

## 2019-04-18 ENCOUNTER — Other Ambulatory Visit: Payer: Self-pay

## 2019-04-18 ENCOUNTER — Encounter: Payer: Self-pay | Admitting: Physical Therapy

## 2019-04-18 DIAGNOSIS — M6281 Muscle weakness (generalized): Secondary | ICD-10-CM | POA: Diagnosis present

## 2019-04-18 DIAGNOSIS — M25562 Pain in left knee: Secondary | ICD-10-CM | POA: Diagnosis not present

## 2019-04-18 DIAGNOSIS — R2689 Other abnormalities of gait and mobility: Secondary | ICD-10-CM | POA: Diagnosis present

## 2019-04-18 DIAGNOSIS — M25662 Stiffness of left knee, not elsewhere classified: Secondary | ICD-10-CM | POA: Insufficient documentation

## 2019-04-18 NOTE — Therapy (Signed)
[facility] [address]  [address] [place], [place], [address] Phone: [phone]   Fax:  [phone]  Physical Therapy Evaluation  Patient Details  Name: Carol Gilbert MRN: [id number] Date of Birth: May 21, 1951 Referring Provider (PT): Gean BirchwoodFrank Rowan, MD   Encounter Date: 04/18/2019  PT End of Session - 04/18/19 1457    Visit Number  1    Number of Visits  9    Date for PT Re-Evaluation  05/16/19    Authorization Type  Medicare & AARP    PT Start Time  1320    PT Stop Time  1401    PT Time Calculation (min)  41 min    Activity Tolerance  Patient tolerated treatment well;Patient limited by pain    Behavior During Therapy  [facility] for tasks assessed/performed       Past Medical History:  Diagnosis Date  . Apnea   . Arthritis   . Asthma   . Diabetes mellitus without complication (HCC)   . Diverticulitis   . Fibromyalgia   . Hypertension     Past Surgical History:  Procedure Laterality Date  . ABDOMINAL HYSTERECTOMY    . ABDOMINAL SURGERY    . APPENDECTOMY    . BARIATRIC SURGERY N/A 08/10/2018  . BREAST BIOPSY    . CESAREAN SECTION    . CHOLECYSTECTOMY    . HERNIA REPAIR    . SINUS SURGERY WITH INSTATRAK    . TOTAL KNEE ARTHROPLASTY Left 04/03/2019   Procedure: Left Knee Arthroplasty;  Surgeon: Gean Birchwoodowan, Frank, MD;  Location: [facility];  Service: Orthopedics;  Laterality: Left;  . WISDOM TOOTH EXTRACTION      There were no vitals filed for this visit.   Subjective Assessment - 04/18/19 1322    Subjective  Patient reports undergoing L TKA on 04/03/19. [facility] as needed for balance and with long distances. Used cane as needed at PLOF. Pain levels has been fluctuating and has been less reliant on meds recently. Pain located laterally as well as over anterior thigh and shin. Worked with HHPT before coming here. Would like to work on prolonged walking and standing as well as STS without pain. Used to work out with a  Systems analystpersonal trainer and did water aerobics at [employer].    Pertinent History  HTN, fibromyalgia, DM, asthma    Limitations  Sitting;Lifting;Standing;Walking;House hold activities    How long can you sit comfortably?  unlimited    How long can you stand comfortably?  15 min    How long can you walk comfortably?  20 min    Patient Stated Goals  work on walking and standing without pain    Pain Score  1     Pain Location  Knee    Pain Orientation  Left    Pain Descriptors / Indicators  Discomfort    Pain Type  Acute pain;Surgical pain         [facility] PT Assessment - 04/18/19 1336      Assessment   Medical Diagnosis  s/p L TKA    Referring Provider (PT)  Gean BirchwoodFrank Rowan, MD    Onset Date/Surgical Date  04/03/19    Next MD Visit  05/23/19    Prior Therapy  yes      Precautions   Precautions  None      Balance Screen   Has the patient fallen in the past 6 months  No    Has the patient had  a decrease in activity level because of a fear of falling?   Yes    Is the patient reluctant to leave their home because of a fear of falling?   No      Home Environment   Living Environment  Private residence    Living Arrangements  Other relatives   caregiver for 67 y/o aunt   Available Help at Discharge  Family    Type of Home  --   townhouse   Home Access  Level entry    Home Layout  One level    Home Equipment  [place] - single point      Prior Function   Level of Independence  Independent    Vocation  Retired    Development worker, international aidLeisure  water aerobics, working with Librarian, academicpersonal trainer      Cognition   Overall Cognitive Status  Within Functional Limits for tasks assessed      Observation/Other Assessments   Observations  well-healing L knee incision    Focus on Therapeutic Outcomes (FOTO)   Knee: 50 (50% limited, 24% predicted)      Sensation   Light Touch  Appears Intact      Coordination   Gross Motor Movements are Fluid and Coordinated  Yes      Posture/Postural Control   Posture/Postural Control   Postural limitations    Postural Limitations  Rounded Shoulders;Forward head      ROM / Strength   AROM / PROM / Strength  AROM;PROM;Strength      AROM   AROM Assessment Site  Knee    Right/Left Knee  Left;Right    Right Knee Extension  0    Right Knee Flexion  112   pain   Left Knee Extension  0    Left Knee Flexion  92   pain     PROM   PROM Assessment Site  Knee    Right/Left Knee  Left;Right    Right Knee Extension  0    Right Knee Flexion  113   pain   Left Knee Extension  0    Left Knee Flexion  96   pain     Strength   Strength Assessment Site  Hip;Knee;Ankle    Right/Left Hip  Right;Left    Right Hip Flexion  4/5    Right Hip ABduction  4/5    Right Hip ADduction  4/5    Left Hip Flexion  4-/5    Left Hip ABduction  4/5    Left Hip ADduction  4/5    Right/Left Knee  Right;Left    Right Knee Flexion  4/5    Right Knee Extension  5/5    Left Knee Flexion  3/5    Left Knee Extension  3/5    Right/Left Ankle  Right;Left    Right Ankle Dorsiflexion  4+/5    Right Ankle Plantar Flexion  4+/5    Left Ankle Dorsiflexion  4+/5    Left Ankle Plantar Flexion  4/5      Palpation   Patella mobility  B patellar hypomobility in all directions; TTP in R medial knee    Palpation comment  L posterior calf TTP- pt reporting this for the past 4 months; calf supple and without redness/warmth      Ambulation/Gait   Assistive device  None    Gait Pattern  Step-to pattern;Step-through pattern;Decreased stance time - left;Decreased hip/knee flexion - left;Decreased weight shift to left;Decreased step length - right;Antalgic;Trunk  flexed;Lateral trunk lean to right;Lateral trunk lean to left    Ambulation Surface  Level;Indoor    Gait velocity  decreased                Objective measurements completed on examination: See above findings.              PT Education - 04/18/19 1457    Education Details  prognosis, POC, HEP    Person(s) Educated  Patient     Methods  Explanation;Demonstration;Tactile cues;Verbal cues;Handout    Comprehension  Verbalized understanding;Returned demonstration       PT Short Term Goals - 04/18/19 1510      PT SHORT TERM GOAL #1   Title  Patient to be independent with intial HEP.    Time  2    Period  Weeks    Status  New    Target Date  05/02/19        PT Long Term Goals - 04/18/19 1510      PT LONG TERM GOAL #1   Title  Patient to be independent with advanced HEP.    Time  4    Period  Weeks    Status  New    Target Date  05/16/19      PT LONG TERM GOAL #2   Title  Patient to demonstrate L knee AROM/PROM 0-120 degrees.    Time  4    Period  Weeks    Status  New    Target Date  05/16/19      PT LONG TERM GOAL #3   Title  Patient to demonstrate B LE strength >=4+/5.    Time  4    Period  Weeks    Status  New    Target Date  05/16/19      PT LONG TERM GOAL #4   Title  Patient to demonstrate symmetrical weight shift, step length, and knee flexion with ambulation with LRAD.    Time  4    Period  Weeks    Status  New    Target Date  05/16/19      PT LONG TERM GOAL #5   Title  Patient to report tolerance of 1 hour of walking/standing without onset of pain.    Time  4    Period  Weeks    Status  New    Target Date  05/16/19      Additional Long Term Goals   Additional Long Term Goals  Yes      PT LONG TERM GOAL #6   Title  Patient to return to fitness program or activity with modifications as needed in order to continue fitness regimen.    Time  4    Period  Weeks    Status  New    Target Date  05/16/19             Plan - 04/18/19 1502    Clinical Impression Statement  Patient is a 67y/o F presenting to OPPT with c/o L knee pain s/p L TKA on 04/03/19. Ambulating with SPC as needed for balance and with prolonged distances. Would like to work towards tolerating prolonged walking and standing, as well as STS transfers without pain. Patient did water aerobics and exercised with a  personal trainer [facility], and would like to return to these activities. 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 Patient today demonstrating decreased and painful R knee ROM, decreased LE strength,  R knee hypomobility, and gait deviations. Educated patient on gentle stretching and ROM HEP- patient reported understanding. Would benefit from skilled PT services 2x/week for 4 weeks to address aforementioned impairments and return to PLOF.    Personal Factors and Comorbidities  Age;Comorbidity 3+;Time since onset of injury/illness/exacerbation;Fitness;Past/Current Experience    Comorbidities  HTN, fibromyalgia, DM, asthma    Examination-Activity Limitations  Bathing;Sit;Bend;Squat;Caring for Others;Stairs;Carry;Stand;Toileting;Transfers;Lift;Locomotion Level    Examination-Participation Restrictions  Church;School;Cleaning;Shop;Community Activity;Driving;Yard Work;Interpersonal Relationship;Laundry;Meal Prep    Stability/Clinical Decision Making  Stable/Uncomplicated    Clinical Decision Making  Low    Rehab Potential  Good    PT Frequency  2x / week    PT Duration  4 weeks    PT Treatment/Interventions  ADLs/Self Care Home Management;Cryotherapy;Electrical Stimulation;Moist Heat;Balance training;Therapeutic activities;Stair training;Gait training;Ultrasound;Neuromuscular re-education;Patient/family education;Therapeutic exercise;Manual techniques;Vasopneumatic Device;Taping;Energy conservation;Dry needling;Passive range of motion;Scar mobilization    PT Next Visit Plan  reassess HEP    Consulted and Agree with Plan of Care  Patient       Patient will benefit from skilled therapeutic intervention in order to improve the following deficits and impairments:  Hypomobility, Increased edema, Decreased scar mobility, Decreased activity tolerance, Decreased strength, Pain, Difficulty walking, Decreased balance, Decreased range of motion, Improper body mechanics, Postural dysfunction, Impaired  flexibility  Visit Diagnosis: 1. Acute pain of left knee   2. Stiffness of left knee, not elsewhere classified   3. Muscle weakness (generalized)   4. Other abnormalities of gait and mobility        Problem List Patient Active Problem List   Diagnosis Date Noted  . Total knee replacement status, left 04/03/2019  . Degenerative arthritis of left knee 03/30/2019  . Asthma 02/17/2019    Janene Harvey, PT, DPT 04/18/19 3:15 PM   [facility] [address]  [place], [place], [address] Phone: [phone]   Fax:  [phone]  Name: Carol Gilbert MRN: [id number] Date of Birth: 1951-07-11

## 2019-04-20 ENCOUNTER — Ambulatory Visit: Payer: Medicare Other

## 2019-04-20 ENCOUNTER — Other Ambulatory Visit: Payer: Self-pay

## 2019-04-20 DIAGNOSIS — M6281 Muscle weakness (generalized): Secondary | ICD-10-CM

## 2019-04-20 DIAGNOSIS — R2689 Other abnormalities of gait and mobility: Secondary | ICD-10-CM

## 2019-04-20 DIAGNOSIS — M25562 Pain in left knee: Secondary | ICD-10-CM | POA: Diagnosis not present

## 2019-04-20 DIAGNOSIS — M25662 Stiffness of left knee, not elsewhere classified: Secondary | ICD-10-CM

## 2019-04-20 NOTE — Therapy (Signed)
[facility] [address]  [address] [place], [place], [address] Phone: [phone]   Fax:  [phone]  Physical Therapy Treatment  Patient Details  Name: Carol Gilbert MRN: [id number] Date of Birth: [birth year] Referring Provider (PT): Gean BirchwoodFrank Rowan, MD   Encounter Date: 04/20/2019  PT End of Session - 04/20/19 1324    Visit Number  2    Number of Visits  9    Date for PT Re-Evaluation  05/16/19    Authorization Type  Medicare & AARP    PT Start Time  1315    PT Stop Time  1410    PT Time Calculation (min)  55 min    Activity Tolerance  Patient tolerated treatment well;Patient limited by pain    Behavior During Therapy  [facility] for tasks assessed/performed       Past Medical History:  Diagnosis Date  . Apnea   . Arthritis   . Asthma   . Diabetes mellitus without complication (HCC)   . Diverticulitis   . Fibromyalgia   . Hypertension     Past Surgical History:  Procedure Laterality Date  . ABDOMINAL HYSTERECTOMY    . ABDOMINAL SURGERY    . APPENDECTOMY    . BARIATRIC SURGERY N/A 08/10/2018  . BREAST BIOPSY    . CESAREAN SECTION    . CHOLECYSTECTOMY    . HERNIA REPAIR    . SINUS SURGERY WITH INSTATRAK    . TOTAL KNEE ARTHROPLASTY Left 04/03/2019   Procedure: Left Knee Arthroplasty;  Surgeon: Gean Birchwoodowan, Frank, MD;  Location: [facility];  Service: Orthopedics;  Laterality: Left;  . WISDOM TOOTH EXTRACTION      There were no vitals filed for this visit.  Subjective Assessment - 04/20/19 1341    Subjective  Pt. doing well.  Doing HEP daily and HH PT HEP.    Pertinent History  HTN, fibromyalgia, DM, asthma    Patient Stated Goals  work on walking and standing without pain    Currently in Pain?  Yes    Pain Score  3     Pain Location  Knee    Pain Orientation  Left    Pain Descriptors / Indicators  Discomfort    Pain Type  Acute pain;Surgical pain    Pain Frequency  Intermittent    Multiple Pain Sites  No                        [facility] Adult PT Treatment/Exercise - 04/20/19 0001      Knee/Hip Exercises: Stretches   Passive Hamstring Stretch  Left;1 rep;30 seconds    Passive Hamstring Stretch Limitations  strap      Knee/Hip Exercises: Aerobic   Nustep  lvl 3, 6 min (UE/LE)      Knee/Hip Exercises: Standing   Heel Raises  Both;10 reps    Heel Raises Limitations  chair    Terminal Knee Extension  Left;10 reps;Theraband;Strengthening    Theraband Level (Terminal Knee Extension)  Level 4 (Blue)    Terminal Knee Extension Limitations  therapist cueing for TKE     Forward Step Up  Left;10 reps;1 set;Step Height: 4";Hand Hold: 1    Forward Step Up Limitations  chair support      Knee/Hip Exercises: Seated   Long Arc Quad  Left;15 reps;Strengthening    Long Arc Quad Limitations  near full TKE    Hamstring Curl  Left;10 reps;Strengthening  Hamstring Limitations  red TB      Knee/Hip Exercises: Supine   Short Arc Quad Sets  Left;10 reps;Strengthening    Short Arc Quad Sets Limitations  2#    Heel Slides  Left;10 reps;AAROM    Heel Slides Limitations  strap heel slide    Knee Flexion  Left;AAROM;10 reps    Knee Flexion Limitations  strap; LE resting on peanut p-ball       Modalities   Modalities  Vasopneumatic      Vasopneumatic   Number Minutes Vasopneumatic   10 minutes    Vasopnuematic Location   Knee   L   Vasopneumatic Pressure  Medium    Vasopneumatic Temperature   coldest temp.             PT Education - 04/20/19 1331    Education Details  Pt. noting she is performing HH PT HEP: seated hip adduction, quad set, SLR, standing heel/toe raise, supine hip abd/adduction, seated flexion stretch, seated LAQ, heel slide    Person(s) Educated  Patient    Methods  Explanation;Demonstration;Verbal cues;Handout    Comprehension  Verbalized understanding;Returned demonstration;Verbal cues required       PT Short Term Goals - 04/20/19 1325      PT SHORT TERM GOAL  #1   Title  Patient to be independent with intial HEP.    Time  2    Period  Weeks    Status  On-going    Target Date  05/02/19        PT Long Term Goals - 04/20/19 1344      PT LONG TERM GOAL #1   Title  Patient to be independent with advanced HEP.    Time  4    Period  Weeks    Status  On-going      PT LONG TERM GOAL #2   Title  Patient to demonstrate L knee AROM/PROM 0-120 degrees.    Time  4    Period  Weeks    Status  On-going      PT LONG TERM GOAL #3   Title  Patient to demonstrate B LE strength >=4+/5.    Time  4    Period  Weeks    Status  On-going      PT LONG TERM GOAL #4   Title  Patient to demonstrate symmetrical weight shift, step length, and knee flexion with ambulation with LRAD.    Time  4    Period  Weeks    Status  On-going      PT LONG TERM GOAL #5   Title  Patient to report tolerance of 1 hour of walking/standing without onset of pain.    Time  4    Period  Weeks    Status  On-going      PT LONG TERM GOAL #6   Title  Patient to return to fitness program or activity with modifications as needed in order to continue fitness regimen.    Time  4    Period  Weeks    Status  On-going            Plan - 04/20/19 1339    Clinical Impression Statement  Pt. doing well today.  Ambulating into clinic forgetting SPC in waiting room however visibly unstable without AD thus therapist requesting pt. to bring [facility] into session.  Pt. able to demo proper technique ambulating with SPC in regards sequencing however did require brief cueing for  TKE.  Session focused on HEP review and review of HH PT HEP to check for pt. adherence.  Pt. reporting she is performing all HEP activities 2x/day and verbalizing/demonstrating good overall understanding.  Tolerated all gentle ROM and LE strengthening activates well today.  Pt. L knee flexion ROM informally measured at ~ 100 ft flexion demonstrating progression toward ROM goal.  Ended visit with ice/compression to L knee to  reduce post-exercise swelling and pain.    Personal Factors and Comorbidities  Age;Comorbidity 3+;Time since onset of injury/illness/exacerbation;Fitness;Past/Current Experience    Comorbidities  HTN, fibromyalgia, DM, asthma    Rehab Potential  Good    PT Treatment/Interventions  ADLs/Self Care Home Management;Cryotherapy;Electrical Stimulation;Moist Heat;Balance training;Therapeutic activities;Stair training;Gait training;Ultrasound;Neuromuscular re-education;Patient/family education;Therapeutic exercise;Manual techniques;Vasopneumatic Device;Taping;Energy conservation;Dry needling;Passive range of motion;Scar mobilization    Consulted and Agree with Plan of Care  Patient       Patient will benefit from skilled therapeutic intervention in order to improve the following deficits and impairments:  Hypomobility, Increased edema, Decreased scar mobility, Decreased activity tolerance, Decreased strength, Pain, Difficulty walking, Decreased balance, Decreased range of motion, Improper body mechanics, Postural dysfunction, Impaired flexibility  Visit Diagnosis: Acute pain of left knee  Stiffness of left knee, not elsewhere classified  Muscle weakness (generalized)  Other abnormalities of gait and mobility     Problem List Patient Active Problem List   Diagnosis Date Noted  . Total knee replacement status, left 04/03/2019  . Degenerative arthritis of left knee 03/30/2019  . Asthma 02/17/2019    Kermit BaloMicah Tyne Banta, PTA 04/20/19 4:15 PM   [facility] [address]  [address] [place], [place], [address] Phone: [phone]   Fax:  [phone]  Name: Carol Gilbert MRN: [id number] Date of Birth: 05-23-1951

## 2019-04-25 ENCOUNTER — Ambulatory Visit: Payer: Medicare Other

## 2019-04-25 ENCOUNTER — Other Ambulatory Visit: Payer: Self-pay

## 2019-04-25 DIAGNOSIS — M25562 Pain in left knee: Secondary | ICD-10-CM | POA: Diagnosis not present

## 2019-04-25 DIAGNOSIS — M25662 Stiffness of left knee, not elsewhere classified: Secondary | ICD-10-CM

## 2019-04-25 DIAGNOSIS — R2689 Other abnormalities of gait and mobility: Secondary | ICD-10-CM

## 2019-04-25 DIAGNOSIS — M6281 Muscle weakness (generalized): Secondary | ICD-10-CM

## 2019-04-25 NOTE — Therapy (Signed)
[facility] [address]  [address] [place], [place], [address] Phone: [phone]   Fax:  [phone]  Physical Therapy Treatment  Patient Details  Name: Carol Gilbert MRN: [id number] Date of Birth: 06-12-1951 Referring Provider (PT): Gean BirchwoodFrank Rowan, MD   Encounter Date: 04/25/2019  PT End of Session - 04/25/19 1317    Visit Number  3    Number of Visits  9    Date for PT Re-Evaluation  05/16/19    Authorization Type  Medicare & AARP    PT Start Time  1312    PT Stop Time  1404    PT Time Calculation (min)  52 min    Activity Tolerance  Patient tolerated treatment well    Behavior During Therapy  [facility] for tasks assessed/performed       Past Medical History:  Diagnosis Date  . Apnea   . Arthritis   . Asthma   . Diabetes mellitus without complication (HCC)   . Diverticulitis   . Fibromyalgia   . Hypertension     Past Surgical History:  Procedure Laterality Date  . ABDOMINAL HYSTERECTOMY    . ABDOMINAL SURGERY    . APPENDECTOMY    . BARIATRIC SURGERY N/A 08/10/2018  . BREAST BIOPSY    . CESAREAN SECTION    . CHOLECYSTECTOMY    . HERNIA REPAIR    . SINUS SURGERY WITH INSTATRAK    . TOTAL KNEE ARTHROPLASTY Left 04/03/2019   Procedure: Left Knee Arthroplasty;  Surgeon: Gean Birchwoodowan, Frank, MD;  Location: [facility];  Service: Orthopedics;  Laterality: Left;  . WISDOM TOOTH EXTRACTION      There were no vitals filed for this visit.  Subjective Assessment - 04/25/19 1316    Subjective  Pt. denies soreness after last session.  Reports continued daily performance of HEP.    Pertinent History  HTN, fibromyalgia, DM, asthma    Patient Stated Goals  work on walking and standing without pain    Currently in Pain?  Yes    Pain Score  1     Pain Location  Knee    Pain Orientation  Left    Pain Descriptors / Indicators  Discomfort    Pain Type  Acute pain;Surgical pain    Multiple Pain Sites  No         [facility] PT  Assessment - 04/25/19 0001      AROM   AROM Assessment Site  Knee    Right/Left Knee  Left    Left Knee Extension  0   mild 2 dg quad lag with SLR   Left Knee Flexion  108                   [facility] Adult PT Treatment/Exercise - 04/25/19 0001      Knee/Hip Exercises: Stretches   Hip Flexor Stretch  Left;1 rep;30 seconds    Hip Flexor Stretch Limitations  mod thomas stretch + strap       Knee/Hip Exercises: Aerobic   Recumbent Bike  6 min, Full revolutions - for ROM       Knee/Hip Exercises: Standing   Functional Squat  10 reps;3 seconds    Functional Squat Limitations  chair       Knee/Hip Exercises: Seated   Other Seated Knee/Hip Exercises  L fitter leg press (1 black band) x 15 reps     Hamstring Curl  Left;10 reps;Strengthening    Hamstring Limitations  green TB      Knee/Hip Exercises: Supine   Short Arc Quad Sets  Left;10 reps;Strengthening    Short Arc Quad Sets Limitations  2#    Straight Leg Raises  Left;10 reps;Strengthening    Straight Leg Raises Limitations  slight visible quad lag    Patellar Mobs  L all directions - min limitation     Knee Flexion  Left;AAROM;10 reps    Knee Flexion Limitations  strap; LE resting on peanut p-ball       Vasopneumatic   Number Minutes Vasopneumatic   10 minutes    Vasopnuematic Location   Knee    Vasopneumatic Pressure  Medium    Vasopneumatic Temperature   coldest temp.      Manual Therapy   Manual Therapy  Joint mobilization    Manual therapy comments  supine     Joint Mobilization  L patellar mobs all directions                PT Short Term Goals - 04/25/19 1317      PT SHORT TERM GOAL #1   Title  Patient to be independent with intial HEP.    Time  2    Period  Weeks    Status  Achieved    Target Date  05/02/19        PT Long Term Goals - 04/20/19 1344      PT LONG TERM GOAL #1   Title  Patient to be independent with advanced HEP.    Time  4    Period  Weeks    Status  On-going       PT LONG TERM GOAL #2   Title  Patient to demonstrate L knee AROM/PROM 0-120 degrees.    Time  4    Period  Weeks    Status  On-going      PT LONG TERM GOAL #3   Title  Patient to demonstrate B LE strength >=4+/5.    Time  4    Period  Weeks    Status  On-going      PT LONG TERM GOAL #4   Title  Patient to demonstrate symmetrical weight shift, step length, and knee flexion with ambulation with LRAD.    Time  4    Period  Weeks    Status  On-going      PT LONG TERM GOAL #5   Title  Patient to report tolerance of 1 hour of walking/standing without onset of pain.    Time  4    Period  Weeks    Status  On-going      PT LONG TERM GOAL #6   Title  Patient to return to fitness program or activity with modifications as needed in order to continue fitness regimen.    Time  4    Period  Weeks    Status  On-going            Plan - 04/25/19 1317    Clinical Impression Statement  Carol Gilbert doing well today.  Able to demo L knee AROM flexion progress to 108 dg today.  Tolerated progression of standing LE strengthening activities very well without soreness.  Ended visit with ice/compression to L knee to reduce post-exercise swelling.    Personal Factors and Comorbidities  Age;Comorbidity 3+;Time since onset of injury/illness/exacerbation;Fitness;Past/Current Experience    Comorbidities  HTN, fibromyalgia, DM, asthma    Rehab Potential  Good    PT Treatment/Interventions  ADLs/Self Care Home Management;Cryotherapy;Electrical Stimulation;Moist Heat;Balance training;Therapeutic activities;Stair training;Gait training;Ultrasound;Neuromuscular re-education;Patient/family education;Therapeutic exercise;Manual techniques;Vasopneumatic Device;Taping;Energy conservation;Dry needling;Passive range of motion;Scar mobilization    Consulted and Agree with Plan of Care  Patient       Patient will benefit from skilled therapeutic intervention in order to improve the following deficits and impairments:   Hypomobility, Increased edema, Decreased scar mobility, Decreased activity tolerance, Decreased strength, Pain, Difficulty walking, Decreased balance, Decreased range of motion, Improper body mechanics, Postural dysfunction, Impaired flexibility  Visit Diagnosis: Acute pain of left knee  Stiffness of left knee, not elsewhere classified  Muscle weakness (generalized)  Other abnormalities of gait and mobility     Problem List Patient Active Problem List   Diagnosis Date Noted  . Total knee replacement status, left 04/03/2019  . Degenerative arthritis of left knee 03/30/2019  . Asthma 02/17/2019    Bess Harvest, PTA 04/25/19 2:00 PM   [facility] [address]  [place], [place], [address] Phone: [phone]   Fax:  [phone]  Name: Carol Gilbert MRN: [id number] Date of Birth: February 15, 1951

## 2019-04-27 ENCOUNTER — Ambulatory Visit: Payer: Medicare Other

## 2019-04-27 ENCOUNTER — Other Ambulatory Visit: Payer: Self-pay

## 2019-04-27 DIAGNOSIS — R2689 Other abnormalities of gait and mobility: Secondary | ICD-10-CM

## 2019-04-27 DIAGNOSIS — M6281 Muscle weakness (generalized): Secondary | ICD-10-CM

## 2019-04-27 DIAGNOSIS — M25562 Pain in left knee: Secondary | ICD-10-CM | POA: Diagnosis not present

## 2019-04-27 DIAGNOSIS — M25662 Stiffness of left knee, not elsewhere classified: Secondary | ICD-10-CM

## 2019-04-27 NOTE — Therapy (Signed)
[facility] [address]  [address] [place], [place], [address] Phone: [phone]   Fax:  [phone]  Physical Therapy Treatment  Patient Details  Name: Carol Gilbert MRN: [id number] Date of Birth: 1950-10-04 Referring Provider (PT): Gean BirchwoodFrank Rowan, MD   Encounter Date: 04/27/2019  PT End of Session - 04/27/19 1318    Visit Number  4    Number of Visits  9    Date for PT Re-Evaluation  05/16/19    Authorization Type  Medicare & AARP    PT Start Time  1309    PT Stop Time  1357    PT Time Calculation (min)  48 min    Activity Tolerance  Patient tolerated treatment well    Behavior During Therapy  [facility] for tasks assessed/performed       Past Medical History:  Diagnosis Date  . Apnea   . Arthritis   . Asthma   . Diabetes mellitus without complication (HCC)   . Diverticulitis   . Fibromyalgia   . Hypertension     Past Surgical History:  Procedure Laterality Date  . ABDOMINAL HYSTERECTOMY    . ABDOMINAL SURGERY    . APPENDECTOMY    . BARIATRIC SURGERY N/A 08/10/2018  . BREAST BIOPSY    . CESAREAN SECTION    . CHOLECYSTECTOMY    . HERNIA REPAIR    . SINUS SURGERY WITH INSTATRAK    . TOTAL KNEE ARTHROPLASTY Left 04/03/2019   Procedure: Left Knee Arthroplasty;  Surgeon: Gean Birchwoodowan, Frank, MD;  Location: [facility];  Service: Orthopedics;  Laterality: Left;  . WISDOM TOOTH EXTRACTION      There were no vitals filed for this visit.  Subjective Assessment - 04/27/19 1356    Subjective  Has had soreness for last two days however left therapy session two days ago [place]    Pertinent History  HTN, fibromyalgia, DM, asthma    Patient Stated Goals  work on walking and standing without pain    Currently in Pain?  Yes    Pain Score  3     Pain Location  Knee    Pain Orientation  Left    Pain Descriptors / Indicators  Discomfort    Pain Type  Acute pain;Surgical pain    Aggravating  Factors   exercises in therapy and excessive walking at [employer]    Multiple Pain Sites  No                       [facility] Adult PT Treatment/Exercise - 04/27/19 0001      Knee/Hip Exercises: Stretches   Passive Hamstring Stretch  Left;1 rep;30 seconds    Passive Hamstring Stretch Limitations  strap    Hip Flexor Stretch  Left;2 reps;30 seconds    Hip Flexor Stretch Limitations  mod thomas stretch + strap     Gastroc Stretch  Left;1 rep;30 seconds    Gastroc Stretch Limitations  strap       Knee/Hip Exercises: Aerobic   Recumbent Bike  6 min, Full revolutions - for ROM       Knee/Hip Exercises: Standing   Heel Raises  15 reps;Both    Heel Raises Limitations  chair    Other Standing Knee Exercises  Alternating toe clears to 9" step x 10 reps; chair support       Knee/Hip Exercises: Seated   Long [address]Arc Quad  Left;15 reps;Strengthening    Long Arc Quad Limitations  near full TKE    Other Seated Knee/Hip Exercises  L fitter leg press (1 black band, 1 blue) x 15 reps     Hamstring Curl  Left;15 reps    Hamstring Limitations  green TB      Knee/Hip Exercises: Supine   Straight Leg Raises  Left;10 reps;Strengthening    Straight Leg Raises Limitations  slight visible quad lag      Knee/Hip Exercises: Sidelying   Hip ABduction  2 sets;Left;5 reps               PT Short Term Goals - 04/25/19 1317      PT SHORT TERM GOAL #1   Title  Patient to be independent with intial HEP.    Time  2    Period  Weeks    Status  Achieved    Target Date  05/02/19        PT Long Term Goals - 04/20/19 1344      PT LONG TERM GOAL #1   Title  Patient to be independent with advanced HEP.    Time  4    Period  Weeks    Status  On-going      PT LONG TERM GOAL #2   Title  Patient to demonstrate L knee AROM/PROM 0-120 degrees.    Time  4    Period  Weeks    Status  On-going      PT LONG TERM GOAL #3   Title  Patient to demonstrate B LE strength >=4+/5.    Time  4     Period  Weeks    Status  On-going      PT LONG TERM GOAL #4   Title  Patient to demonstrate symmetrical weight shift, step length, and knee flexion with ambulation with LRAD.    Time  4    Period  Weeks    Status  On-going      PT LONG TERM GOAL #5   Title  Patient to report tolerance of 1 hour of walking/standing without onset of pain.    Time  4    Period  Weeks    Status  On-going      PT LONG TERM GOAL #6   Title  Patient to return to fitness program or activity with modifications as needed in order to continue fitness regimen.    Time  4    Period  Weeks    Status  On-going            Plan - 04/27/19 1318    Clinical Impression Statement  Carol Gilbert reporting she left last therapy session and went straight to [place] with her son where she walked for a, "long time".  Has had some increased L knee pain over last two days thus therex today performed in supine and seated primarily to reduce L knee strain.  Progressed resistance with HS curl, and added hip abduction SLR which was tolerated well however demonstrating proximal hip/LE weakness as expected.  Pt. noting she is walking some without SPC and some without AD for short distances.  Ended session with ice/compression to L knee to reduce post-exercise swelling and pain.    Personal Factors and Comorbidities  Age;Comorbidity 3+;Time since onset of injury/illness/exacerbation;Fitness;Past/Current Experience    Comorbidities  HTN, fibromyalgia, DM, asthma    Rehab Potential  Good    PT Treatment/Interventions  ADLs/Self Care Home Management;Cryotherapy;Electrical Stimulation;Moist  Heat;Balance training;Therapeutic activities;Stair training;Gait training;Ultrasound;Neuromuscular re-education;Patient/family education;Therapeutic exercise;Manual techniques;Vasopneumatic Device;Taping;Energy conservation;Dry needling;Passive range of motion;Scar mobilization    Consulted and Agree with Plan of Care  Patient       Patient will benefit  from skilled therapeutic intervention in order to improve the following deficits and impairments:  Hypomobility, Increased edema, Decreased scar mobility, Decreased activity tolerance, Decreased strength, Pain, Difficulty walking, Decreased balance, Decreased range of motion, Improper body mechanics, Postural dysfunction, Impaired flexibility  Visit Diagnosis: Acute pain of left knee  Stiffness of left knee, not elsewhere classified  Muscle weakness (generalized)  Other abnormalities of gait and mobility     Problem List Patient Active Problem List   Diagnosis Date Noted  . Total knee replacement status, left 04/03/2019  . Degenerative arthritis of left knee 03/30/2019  . Asthma 02/17/2019    Kermit Balo, PTA 04/27/19 2:00 PM    [facility] [address]  [address] [place], [place], [address] Phone: [phone]   Fax:  [phone]  Name: Carol Gilbert MRN: [id number] Date of Birth: 10/05/1950

## 2019-05-02 ENCOUNTER — Encounter: Payer: Self-pay | Admitting: Physical Therapy

## 2019-05-02 ENCOUNTER — Ambulatory Visit: Payer: Medicare Other | Attending: Orthopedic Surgery | Admitting: Physical Therapy

## 2019-05-02 ENCOUNTER — Other Ambulatory Visit: Payer: Self-pay

## 2019-05-02 DIAGNOSIS — R2689 Other abnormalities of gait and mobility: Secondary | ICD-10-CM | POA: Insufficient documentation

## 2019-05-02 DIAGNOSIS — M25662 Stiffness of left knee, not elsewhere classified: Secondary | ICD-10-CM | POA: Insufficient documentation

## 2019-05-02 DIAGNOSIS — M25562 Pain in left knee: Secondary | ICD-10-CM | POA: Insufficient documentation

## 2019-05-02 DIAGNOSIS — M6281 Muscle weakness (generalized): Secondary | ICD-10-CM

## 2019-05-02 NOTE — Therapy (Signed)
[facility] [address]  [address] [place], [place], [address] Phone: [phone]   Fax:  [phone]  Physical Therapy Treatment  Patient Details  Name: Carol Gilbert MRN: [id number] Date of Birth: 1951-01-18 Referring Provider (PT): Gean Birchwood, MD   Encounter Date: 05/02/2019  PT End of Session - 05/02/19 1448    Visit Number  5    Number of Visits  9    Date for PT Re-Evaluation  05/16/19    Authorization Type  Medicare & AARP    PT Start Time  1405    PT Stop Time  1447    PT Time Calculation (min)  42 min    Activity Tolerance  Patient tolerated treatment well    Behavior During Therapy  [facility] for tasks assessed/performed       Past Medical History:  Diagnosis Date  . Apnea   . Arthritis   . Asthma   . Diabetes mellitus without complication (HCC)   . Diverticulitis   . Fibromyalgia   . Hypertension     Past Surgical History:  Procedure Laterality Date  . ABDOMINAL HYSTERECTOMY    . ABDOMINAL SURGERY    . APPENDECTOMY    . BARIATRIC SURGERY N/A 08/10/2018  . BREAST BIOPSY    . CESAREAN SECTION    . CHOLECYSTECTOMY    . HERNIA REPAIR    . SINUS SURGERY WITH INSTATRAK    . TOTAL KNEE ARTHROPLASTY Left 04/03/2019   Procedure: Left Knee Arthroplasty;  Surgeon: Gean Birchwood, MD;  Location: [facility];  Service: Orthopedics;  Laterality: Left;  . WISDOM TOOTH EXTRACTION      There were no vitals filed for this visit.  Subjective Assessment - 05/02/19 1408    Subjective  Yesterday was an awful day- had a lot of knee pain. Believes it is d/t the humidity.    Pertinent History  HTN, fibromyalgia, DM, asthma    Patient Stated Goals  work on walking and standing without pain    Currently in Pain?  Yes    Pain Score  5     Pain Location  Knee    Pain Orientation  Left    Pain Descriptors / Indicators  --   unable to describe   Pain Type  Acute pain;Surgical pain    Multiple Pain Sites  Yes    Pain  Score  3    Pain Location  Knee    Pain Orientation  Right    Pain Type  Chronic pain                       [facility] Adult PT Treatment/Exercise - 05/02/19 0001      Self-Care   Self-Care  Scar Mobilizations    Scar Mobilizations  edu on scar massage to L knee incision      Knee/Hip Exercises: Stretches   Hip Flexor Stretch  Left;2 reps;30 seconds    Hip Flexor Stretch Limitations  mod thomas stretch + strap     Other Knee/Hip Stretches  sitting L knee flexion stretch 5x10" to tolerance      Knee/Hip Exercises: Aerobic   Recumbent Bike  L1 x 6 min (full revolutions)      Knee/Hip Exercises: Standing   Functional Squat  10 reps;3 seconds    Functional Squat Limitations  at counter   cues to shift L     Knee/Hip Exercises: Seated   Long Arc  Quad  Left;Strengthening;10 reps;Weights    Long [employer] Quad Limitations  1st set 1#, 2nd set 2#    Hamstring Curl  Strengthening;Left;1 set;10 reps    Hamstring Limitations  blue TB    Sit to Sand  1 set;10 reps;without UE support   manual cues to shift L     Knee/Hip Exercises: Supine   Straight Leg Raises  Strengthening;Left;2 sets;5 reps    Straight Leg Raises Limitations  1st set 0#, 2nd set 1#   had to rest to complete last 2 reps d/t muscle fatigue            PT Education - 05/02/19 1448    Education Details  update to HEP; edu on scar massage to L knee incision    Person(s) Educated  Patient    Methods  Explanation;Demonstration;Tactile cues;Verbal cues;Handout    Comprehension  Verbalized understanding;Returned demonstration       PT Short Term Goals - 04/25/19 1317      PT SHORT TERM GOAL #1   Title  Patient to be independent with intial HEP.    Time  2    Period  Weeks    Status  Achieved    Target Date  05/02/19        PT Long Term Goals - 04/20/19 1344      PT LONG TERM GOAL #1   Title  Patient to be independent with advanced HEP.    Time  4    Period  Weeks    Status  On-going      PT  LONG TERM GOAL #2   Title  Patient to demonstrate L knee AROM/PROM 0-120 degrees.    Time  4    Period  Weeks    Status  On-going      PT LONG TERM GOAL #3   Title  Patient to demonstrate B LE strength >=4+/5.    Time  4    Period  Weeks    Status  On-going      PT LONG TERM GOAL #4   Title  Patient to demonstrate symmetrical weight shift, step length, and knee flexion with ambulation with LRAD.    Time  4    Period  Weeks    Status  On-going      PT LONG TERM GOAL #5   Title  Patient to report tolerance of 1 hour of walking/standing without onset of pain.    Time  4    Period  Weeks    Status  On-going      PT LONG TERM GOAL #6   Title  Patient to return to fitness program or activity with modifications as needed in order to continue fitness regimen.    Time  4    Period  Weeks    Status  On-going            Plan - 05/02/19 1449    Clinical Impression Statement  Patient reporting increased pain in L knee yesterday and believes that this is d/t the recent weather. Able to demonstrate improvement in TKE with LAQ despite addition of increased weighted resistance. Also able to tolerate increased banded resistance with HS curls, but with more difficulty. Cued patient for STS set up, with patient demonstrating good ability to maintain good form after cues given. Challenged patient by addition of increased weighted resistance with SLR. Patient with c/o muscle fatigue during last reps, requiring rest break before finishing up. Patient overall with excellent  effort during session today and with good tolerance of all activities. Updated HEP with more challenging exercises as patient reporting HEP is too easy. Patient without complaints at end of session.    Personal Factors and Comorbidities  Age;Comorbidity 3+;Time since onset of injury/illness/exacerbation;Fitness;Past/Current Experience    Comorbidities  HTN, fibromyalgia, DM, asthma    Rehab Potential  Good    PT  Treatment/Interventions  ADLs/Self Care Home Management;Cryotherapy;Electrical Stimulation;Moist Heat;Balance training;Therapeutic activities;Stair training;Gait training;Ultrasound;Neuromuscular re-education;Patient/family education;Therapeutic exercise;Manual techniques;Vasopneumatic Device;Taping;Energy conservation;Dry needling;Passive range of motion;Scar mobilization    PT Next Visit Plan  progress LE strengthening and knee ROM    Consulted and Agree with Plan of Care  Patient       Patient will benefit from skilled therapeutic intervention in order to improve the following deficits and impairments:  Hypomobility, Increased edema, Decreased scar mobility, Decreased activity tolerance, Decreased strength, Pain, Difficulty walking, Decreased balance, Decreased range of motion, Improper body mechanics, Postural dysfunction, Impaired flexibility  Visit Diagnosis: Acute pain of left knee  Stiffness of left knee, not elsewhere classified  Muscle weakness (generalized)  Other abnormalities of gait and mobility     Problem List Patient Active Problem List   Diagnosis Date Noted  . Total knee replacement status, left 04/03/2019  . Degenerative arthritis of left knee 03/30/2019  . Asthma 02/17/2019     Janene Harvey, PT, DPT 05/02/19 4:43 PM   [facility] [address]  [place], [place], [address] Phone: [phone]   Fax:  [phone]  Name: Carol Gilbert MRN: [id number] Date of Birth: Jul 19, 1951

## 2019-05-04 ENCOUNTER — Encounter: Payer: Self-pay | Admitting: Physical Therapy

## 2019-05-04 ENCOUNTER — Other Ambulatory Visit: Payer: Self-pay

## 2019-05-04 ENCOUNTER — Ambulatory Visit: Payer: Medicare Other | Admitting: Physical Therapy

## 2019-05-04 DIAGNOSIS — M25562 Pain in left knee: Secondary | ICD-10-CM

## 2019-05-04 DIAGNOSIS — R2689 Other abnormalities of gait and mobility: Secondary | ICD-10-CM

## 2019-05-04 DIAGNOSIS — M25662 Stiffness of left knee, not elsewhere classified: Secondary | ICD-10-CM

## 2019-05-04 DIAGNOSIS — M6281 Muscle weakness (generalized): Secondary | ICD-10-CM

## 2019-05-04 NOTE — Therapy (Signed)
[facility] [address]  [address] [place], [place], [address] Phone: [phone]   Fax:  [phone]  Physical Therapy Treatment  Patient Details  Name: Carol Gilbert MRN: [id number] Date of Birth: [birth year] Referring Provider (PT): Gean Birchwood, MD   Encounter Date: 05/04/2019  PT End of Session - 05/04/19 1356    Visit Number  6    Number of Visits  9    Date for PT Re-Evaluation  05/16/19    Authorization Type  Medicare & AARP    PT Start Time  1314    PT Stop Time  1408    PT Time Calculation (min)  54 min    Activity Tolerance  Patient tolerated treatment well;Patient limited by pain    Behavior During Therapy  [facility] for tasks assessed/performed       Past Medical History:  Diagnosis Date  . Apnea   . Arthritis   . Asthma   . Diabetes mellitus without complication (HCC)   . Diverticulitis   . Fibromyalgia   . Hypertension     Past Surgical History:  Procedure Laterality Date  . ABDOMINAL HYSTERECTOMY    . ABDOMINAL SURGERY    . APPENDECTOMY    . BARIATRIC SURGERY N/A 08/10/2018  . BREAST BIOPSY    . CESAREAN SECTION    . CHOLECYSTECTOMY    . HERNIA REPAIR    . SINUS SURGERY WITH INSTATRAK    . TOTAL KNEE ARTHROPLASTY Left 04/03/2019   Procedure: Left Knee Arthroplasty;  Surgeon: Gean Birchwood, MD;  Location: [facility];  Service: Orthopedics;  Laterality: Left;  . WISDOM TOOTH EXTRACTION      There were no vitals filed for this visit.  Subjective Assessment - 05/04/19 1308    Subjective  Has been having back pain and has been using a TENS machine on it. Not sure why it is hurting.    Pertinent History  HTN, fibromyalgia, DM, asthma    Patient Stated Goals  work on walking and standing without pain    Currently in Pain?  Yes    Pain Score  4     Pain Location  Knee    Pain Orientation  Left    Pain Type  Acute pain;Surgical pain                       [facility] Adult PT  Treatment/Exercise - 05/04/19 0001      Exercises   Exercises  Lumbar;Knee/Hip      Knee/Hip Exercises: Stretches   Quad Stretch  Left;2 reps;30 seconds    Quad Stretch Limitations  prone with strap      Knee/Hip Exercises: Aerobic   Recumbent Bike  L1 x 6 min (full revolutions)      Knee/Hip Exercises: Standing   Wall Squat  1 set;10 reps    Wall Squat Limitations  mini wall squat    cues to shift L   Other Standing Knee Exercises  sidestepping with red TB around ankles with HHA 2x64ft      Knee/Hip Exercises: Supine   Heel Slides  Left;10 reps;AAROM    Heel Slides Limitations  10x3" strap heel slide    Bridges with Ball Squeeze  Strengthening;Both;1 set;10 reps   limited ROM   Bridges with Clamshell  Strengthening;Both;1 set;10 reps   red TB above knees     Knee/Hip Exercises: Sidelying   Hip ABduction  Strengthening;Right;Left;1 set;10 reps  Hip ABduction Limitations  manual cues for alignment      Knee/Hip Exercises: Prone   Hip Extension  Strengthening;Right;Left;1 set;10 reps    Hip Extension Limitations  cues to maintain knee straight      Manual Therapy   Manual Therapy  Joint mobilization    Manual therapy comments  supine     Joint Mobilization  attempted genlte patellar mobs- limited by pain and medial knee edema             PT Education - 05/04/19 1355    Education Details  educated patient on red flag symptoms of infection and instructed to seek immediate medical attention if symptoms present    Person(s) Educated  Patient    Methods  Explanation;Demonstration;Tactile cues;Verbal cues;Handout    Comprehension  Verbalized understanding;Returned demonstration       PT Short Term Goals - 04/25/19 1317      PT SHORT TERM GOAL #1   Title  Patient to be independent with intial HEP.    Time  2    Period  Weeks    Status  Achieved    Target Date  05/02/19        PT Long Term Goals - 04/20/19 1344      PT LONG TERM GOAL #1   Title  Patient  to be independent with advanced HEP.    Time  4    Period  Weeks    Status  On-going      PT LONG TERM GOAL #2   Title  Patient to demonstrate L knee AROM/PROM 0-120 degrees.    Time  4    Period  Weeks    Status  On-going      PT LONG TERM GOAL #3   Title  Patient to demonstrate B LE strength >=4+/5.    Time  4    Period  Weeks    Status  On-going      PT LONG TERM GOAL #4   Title  Patient to demonstrate symmetrical weight shift, step length, and knee flexion with ambulation with LRAD.    Time  4    Period  Weeks    Status  On-going      PT LONG TERM GOAL #5   Title  Patient to report tolerance of 1 hour of walking/standing without onset of pain.    Time  4    Period  Weeks    Status  On-going      PT LONG TERM GOAL #6   Title  Patient to return to fitness program or activity with modifications as needed in order to continue fitness regimen.    Time  4    Period  Weeks    Status  On-going            Plan - 05/04/19 1357    Clinical Impression Statement  Patient reporting increased LBP recently without known cause. Has been using a TENS machine, ice, and heat for it, which have helped. Introduced glute strengthening exercises on mat with patient demonstrating limited ROM but with good effort and tolerance. Patient tolerated prone positioning well, thus initiated prone quad stretch and hip extension. Attempted to perform L patellar mobilizations, however patient limited by pain. Upon further inspection, L knee slightly warm and with pocket of swelling over inferomedial knee. Educated patient on red flag symptoms of infection and instructed to seek immediate medical attention if symptoms present. Patient reported understanding. Worked on standing ther-ex with  patient demonstrating good tolerance. Ended session with [place] to L knee for pain and edema relief. R knee appeared normal and patient with no complaints at end of session.    Personal Factors and Comorbidities   Age;Comorbidity 3+;Time since onset of injury/illness/exacerbation;Fitness;Past/Current Experience    Comorbidities  HTN, fibromyalgia, DM, asthma    Rehab Potential  Good    PT Treatment/Interventions  ADLs/Self Care Home Management;Cryotherapy;Electrical Stimulation;Moist Heat;Balance training;Therapeutic activities;Stair training;Gait training;Ultrasound;Neuromuscular re-education;Patient/family education;Therapeutic exercise;Manual techniques;Vasopneumatic Device;Taping;Energy conservation;Dry needling;Passive range of motion;Scar mobilization    PT Next Visit Plan  progress LE strengthening and knee ROM    Consulted and Agree with Plan of Care  Patient       Patient will benefit from skilled therapeutic intervention in order to improve the following deficits and impairments:  Hypomobility, Increased edema, Decreased scar mobility, Decreased activity tolerance, Decreased strength, Pain, Difficulty walking, Decreased balance, Decreased range of motion, Improper body mechanics, Postural dysfunction, Impaired flexibility  Visit Diagnosis: Acute pain of left knee  Stiffness of left knee, not elsewhere classified  Muscle weakness (generalized)  Other abnormalities of gait and mobility     Problem List Patient Active Problem List   Diagnosis Date Noted  . Total knee replacement status, left 04/03/2019  . Degenerative arthritis of left knee 03/30/2019  . Asthma 02/17/2019     Anette GuarneriYevgeniya Vincent Streater, PT, DPT 05/04/19 5:33 PM   [facility] [address]  [address] [place], [place], [address] Phone: [phone]   Fax:  [phone]  Name: Mosetta Anisrma R Schmidt-Soltero MRN: [id number] Date of Birth: Dec 24, 1950

## 2019-05-05 ENCOUNTER — Ambulatory Visit (HOSPITAL_BASED_OUTPATIENT_CLINIC_OR_DEPARTMENT_OTHER): Payer: Medicare Other

## 2019-05-10 ENCOUNTER — Ambulatory Visit: Payer: Medicare Other

## 2019-05-10 ENCOUNTER — Other Ambulatory Visit: Payer: Self-pay

## 2019-05-10 DIAGNOSIS — R2689 Other abnormalities of gait and mobility: Secondary | ICD-10-CM

## 2019-05-10 DIAGNOSIS — M25562 Pain in left knee: Secondary | ICD-10-CM

## 2019-05-10 DIAGNOSIS — M25662 Stiffness of left knee, not elsewhere classified: Secondary | ICD-10-CM

## 2019-05-10 DIAGNOSIS — M6281 Muscle weakness (generalized): Secondary | ICD-10-CM

## 2019-05-10 NOTE — Therapy (Addendum)
[facility] [address]  [address] [place], [place], [address] Phone: [phone]   Fax:  [phone]  Physical Therapy Treatment  Patient Details  Name: Carol Gilbert MRN: [id number] Date of Birth: June 18, 1951 Referring Provider (PT): Gean BirchwoodFrank Rowan, MD   Encounter Date: 05/10/2019  PT End of Session - 05/10/19 1706    Visit Number  7    Number of Visits  9    Date for PT Re-Evaluation  05/16/19    Authorization Type  Medicare & AARP    PT Start Time  1658    PT Stop Time  1736    PT Time Calculation (min)  38 min    Activity Tolerance  Patient tolerated treatment well;Patient limited by pain    Behavior During Therapy  [facility] for tasks assessed/performed       Past Medical History:  Diagnosis Date  . Apnea   . Arthritis   . Asthma   . Diabetes mellitus without complication (HCC)   . Diverticulitis   . Fibromyalgia   . Hypertension     Past Surgical History:  Procedure Laterality Date  . ABDOMINAL HYSTERECTOMY    . ABDOMINAL SURGERY    . APPENDECTOMY    . BARIATRIC SURGERY N/A 08/10/2018  . BREAST BIOPSY    . CESAREAN SECTION    . CHOLECYSTECTOMY    . HERNIA REPAIR    . SINUS SURGERY WITH INSTATRAK    . TOTAL KNEE ARTHROPLASTY Left 04/03/2019   Procedure: Left Knee Arthroplasty;  Surgeon: Gean Birchwoodowan, Frank, MD;  Location: [facility];  Service: Orthopedics;  Laterality: Left;  . WISDOM TOOTH EXTRACTION      There were no vitals filed for this visit.  Subjective Assessment - 05/10/19 1705    Subjective  Pt. doing well today.  Still not sleeping well due to L knee pain.    Pertinent History  HTN, fibromyalgia, DM, asthma    Patient Stated Goals  work on walking and standing without pain    Currently in Pain?  Yes    Pain Score  2     Pain Location  Knee    Pain Orientation  Left;Right    Pain Descriptors / Indicators  Stabbing    Pain Type  Acute pain;Surgical pain    Multiple Pain Sites  No          [facility] PT Assessment - 05/10/19 0001      Assessment   Medical Diagnosis  s/p L TKA    Referring Provider (PT)  Gean BirchwoodFrank Rowan, MD    Onset Date/Surgical Date  04/03/19    Next MD Visit  05/23/19    Prior Therapy  yes                   [facility] Adult PT Treatment/Exercise - 05/10/19 0001      Knee/Hip Exercises: Stretches   Hip Flexor Stretch  Left;2 reps;30 seconds    Hip Flexor Stretch Limitations  mod thomas stretch + strap     Gastroc Stretch  Left;1 rep;30 seconds    Gastroc Stretch Limitations  strap       Knee/Hip Exercises: Aerobic   Recumbent Bike  L1 x 6 min (full revolutions)      Knee/Hip Exercises: Standing   Heel Raises  Both;20 reps    Hip Flexion  Right;Left;10 reps;Knee straight;Stengthening    Hip Flexion Limitations  yellow TB at ankles     Hip Abduction  Right;Left;10 reps;Stengthening;Knee straight    Abduction Limitations  yellow TB at ankles    Hip Extension  Right;Left;10 reps;Knee straight    Extension Limitations  yellow TB at ankles    Forward Step Up  Left;10 reps;Step Height: 6";Hand Hold: 2    Forward Step Up Limitations  two chair support    SLS  L SLS 3 x 10 sec       Knee/Hip Exercises: Seated   Hamstring Curl  Strengthening;Left;1 set;10 reps    Hamstring Limitations  blue TB    Sit to Sand  1 set;10 reps;without UE support             PT Education - 05/10/19 1758    Education Details  HEP update; yellow looped TB issued to pt. for 3-way hip kicker    Person(s) Educated  Patient    Methods  Explanation;Demonstration;Verbal cues;Handout    Comprehension  Verbalized understanding;Returned demonstration       PT Short Term Goals - 04/25/19 1317      PT SHORT TERM GOAL #1   Title  Patient to be independent with intial HEP.    Time  2    Period  Weeks    Status  Achieved    Target Date  05/02/19        PT Long Term Goals - 04/20/19 1344      PT LONG TERM GOAL #1   Title  Patient to be independent with  advanced HEP.    Time  4    Period  Weeks    Status  On-going      PT LONG TERM GOAL #2   Title  Patient to demonstrate L knee AROM/PROM 0-120 degrees.    Time  4    Period  Weeks    Status  On-going      PT LONG TERM GOAL #3   Title  Patient to demonstrate B LE strength >=4+/5.    Time  4    Period  Weeks    Status  On-going      PT LONG TERM GOAL #4   Title  Patient to demonstrate symmetrical weight shift, step length, and knee flexion with ambulation with LRAD.    Time  4    Period  Weeks    Status  On-going      PT LONG TERM GOAL #5   Title  Patient to report tolerance of 1 hour of walking/standing without onset of pain.    Time  4    Period  Weeks    Status  On-going      PT LONG TERM GOAL #6   Title  Patient to return to fitness program or activity with modifications as needed in order to continue fitness regimen.    Time  4    Period  Weeks    Status  On-going            Plan - 05/10/19 1709    Clinical Impression Statement  Pt. doing well today however notes she wishes to focus on improving her balance with therapy.  Session focused on proximal hip strengthening activities with 3-way hip kicker in standing, and introduction to SLS balance with UE support.  Pt. tolerated all activities in therapy well and updated HEP with standing activities (see pt. education).  Ended visit with pt. declining ice.  Progressing well.    Personal Factors and Comorbidities  Age;Comorbidity 3+;Time since onset of injury/illness/exacerbation;Fitness;Past/Current Experience  Comorbidities  HTN, fibromyalgia, DM, asthma    Rehab Potential  Good    PT Treatment/Interventions  ADLs/Self Care Home Management;Cryotherapy;Electrical Stimulation;Moist Heat;Balance training;Therapeutic activities;Stair training;Gait training;Ultrasound;Neuromuscular re-education;Patient/family education;Therapeutic exercise;Manual techniques;Vasopneumatic Device;Taping;Energy conservation;Dry  needling;Passive range of motion;Scar mobilization    PT Next Visit Plan  progress LE strengthening and knee ROM    Consulted and Agree with Plan of Care  Patient       Patient will benefit from skilled therapeutic intervention in order to improve the following deficits and impairments:  Hypomobility, Increased edema, Decreased scar mobility, Decreased activity tolerance, Decreased strength, Pain, Difficulty walking, Decreased balance, Decreased range of motion, Improper body mechanics, Postural dysfunction, Impaired flexibility  Visit Diagnosis: Acute pain of left knee  Stiffness of left knee, not elsewhere classified  Muscle weakness (generalized)  Other abnormalities of gait and mobility     Problem List Patient Active Problem List   Diagnosis Date Noted  . Total knee replacement status, left 04/03/2019  . Degenerative arthritis of left knee 03/30/2019  . Asthma 02/17/2019    Kermit Balo, PTA 05/10/19 5:58 PM    [facility] [address]  [address] [place], [place], [address] Phone: [phone]   Fax:  [phone]  Name: Carol Gilbert MRN: [id number] Date of Birth: [birth year]

## 2019-05-11 ENCOUNTER — Ambulatory Visit: Payer: Medicare Other

## 2019-05-11 ENCOUNTER — Other Ambulatory Visit: Payer: Self-pay

## 2019-05-11 DIAGNOSIS — R2689 Other abnormalities of gait and mobility: Secondary | ICD-10-CM

## 2019-05-11 DIAGNOSIS — M25562 Pain in left knee: Secondary | ICD-10-CM | POA: Diagnosis not present

## 2019-05-11 DIAGNOSIS — M25662 Stiffness of left knee, not elsewhere classified: Secondary | ICD-10-CM

## 2019-05-11 DIAGNOSIS — M6281 Muscle weakness (generalized): Secondary | ICD-10-CM

## 2019-05-11 NOTE — Therapy (Signed)
[facility] [address]  [place], [place], [address] Phone: [phone]   Fax:  [phone]  Physical Therapy Treatment  Patient Details  Name: Carol Gilbert MRN: [id number] Date of Birth: 03-02-1951 Referring Provider (PT): Frederik Pear, MD   Encounter Date: 05/11/2019  PT End of Session - 05/11/19 1702    Visit Number  8    Number of Visits  9    Date for PT Re-Evaluation  05/16/19    Authorization Type  Medicare & AARP    PT Start Time  0539    PT Stop Time  1740    PT Time Calculation (min)  42 min    Activity Tolerance  Patient tolerated treatment well;Patient limited by pain    Behavior During Therapy  [facility] for tasks assessed/performed       Past Medical History:  Diagnosis Date  . Apnea   . Arthritis   . Asthma   . Diabetes mellitus without complication ([place])   . Diverticulitis   . Fibromyalgia   . Hypertension     Past Surgical History:  Procedure Laterality Date  . ABDOMINAL HYSTERECTOMY    . ABDOMINAL SURGERY    . APPENDECTOMY    . BARIATRIC SURGERY N/A 08/10/2018  . BREAST BIOPSY    . CESAREAN SECTION    . CHOLECYSTECTOMY    . HERNIA REPAIR    . SINUS SURGERY WITH INSTATRAK    . TOTAL KNEE ARTHROPLASTY Left 04/03/2019   Procedure: Left Knee Arthroplasty;  Surgeon: Frederik Pear, MD;  Location: [facility];  Service: Orthopedics;  Laterality: Left;  . WISDOM TOOTH EXTRACTION      There were no vitals filed for this visit.  Subjective Assessment - 05/11/19 1701    Subjective  Pt. doing well today and denies soreness.    Pertinent History  HTN, fibromyalgia, DM, asthma    Patient Stated Goals  work on walking and standing without pain    Currently in Pain?  Yes    Pain Score  1     Pain Location  Knee    Pain Orientation  Left;Right    Pain Descriptors / Indicators  Stabbing    Pain Type  Acute pain;Surgical pain    Pain Frequency  Intermittent    Multiple Pain Sites  No          [facility] PT Assessment - 05/11/19 0001      AROM   AROM Assessment Site  Knee    Right/Left Knee  Left    Left Knee Extension  0    Left Knee Flexion  115                   [facility] Adult PT Treatment/Exercise - 05/11/19 0001      Knee/Hip Exercises: Stretches   Hip Flexor Stretch  Left;2 reps;30 seconds    Hip Flexor Stretch Limitations  mod thomas stretch + strap     Knee: Self-Stretch to increase Flexion  Left    Knee: Self-Stretch Limitations  5" x 10 reps standing lunging into TM       Knee/Hip Exercises: Aerobic   Recumbent Bike  L1 x 6 min (full revolutions)      Knee/Hip Exercises: Machines for Strengthening   Cybex Knee Extension  B LEs: 5# x 15 reps     Cybex Knee Flexion  B LE's: 20# x 15 rpes       Knee/Hip Exercises:  Standing   Heel Raises  Both;20 reps    Heel Raises Limitations  chair    Knee Flexion  Right;Left;10 reps    Knee Flexion Limitations  chair     Hip Flexion  Right;Left;10 reps;Knee straight;Stengthening    Hip Flexion Limitations  yellow TB at ankles     Terminal Knee Extension  Left;Theraband;Strengthening;15 reps    Theraband Level (Terminal Knee Extension)  Level 4 (Blue)    Terminal Knee Extension Limitations  TKE with blue TB closed in door    Hip Abduction  Right;Left;10 reps;Stengthening;Knee straight    Abduction Limitations  yellow TB at ankles    Hip Extension  Right;Left;10 reps;Knee straight    Extension Limitations  yellow TB at ankles    Step Down  Left;10 reps;Step Height: 4";Hand Hold: 2    Step Down Limitations  at machine - cues for level hips position to isolate knee motion       Knee/Hip Exercises: Supine   Bridges  Both;10 reps    Straight Leg Raises  Left;10 reps               PT Short Term Goals - 04/25/19 1317      PT SHORT TERM GOAL #1   Title  Patient to be independent with intial HEP.    Time  2    Period  Weeks    Status  Achieved    Target Date  05/02/19        PT Long Term Goals  - 04/20/19 1344      PT LONG TERM GOAL #1   Title  Patient to be independent with advanced HEP.    Time  4    Period  Weeks    Status  On-going      PT LONG TERM GOAL #2   Title  Patient to demonstrate L knee AROM/PROM 0-120 degrees.    Time  4    Period  Weeks    Status  On-going      PT LONG TERM GOAL #3   Title  Patient to demonstrate B LE strength >=4+/5.    Time  4    Period  Weeks    Status  On-going      PT LONG TERM GOAL #4   Title  Patient to demonstrate symmetrical weight shift, step length, and knee flexion with ambulation with LRAD.    Time  4    Period  Weeks    Status  On-going      PT LONG TERM GOAL #5   Title  Patient to report tolerance of 1 hour of walking/standing without onset of pain.    Time  4    Period  Weeks    Status  On-going      PT LONG TERM GOAL #6   Title  Patient to return to fitness program or activity with modifications as needed in order to continue fitness regimen.    Time  4    Period  Weeks    Status  On-going            Plan - 05/11/19 1707    Clinical Impression Statement  Keyani denying soreness from last visit.  Performed well with progression of stepping and proximal hip strengthening activities today and denied soreness to end session today.  Modalities deferred.  Pt. able to demo progression of L knee AROM flexion to 115 dg.  Progressing well toward established goals and verbalized that she  wishes to continue with therapy after next session as she feels her strength improving and wishes to prepare for future planned R TKA.    Personal Factors and Comorbidities  Age;Comorbidity 3+;Time since onset of injury/illness/exacerbation;Fitness;Past/Current Experience    Comorbidities  HTN, fibromyalgia, DM, asthma    Rehab Potential  Good    PT Treatment/Interventions  ADLs/Self Care Home Management;Cryotherapy;Electrical Stimulation;Moist Heat;Balance training;Therapeutic activities;Stair training;Gait  training;Ultrasound;Neuromuscular re-education;Patient/family education;Therapeutic exercise;Manual techniques;Vasopneumatic Device;Taping;Energy conservation;Dry needling;Passive range of motion;Scar mobilization    PT Next Visit Plan  progress LE strengthening and knee ROM    Consulted and Agree with Plan of Care  Patient       Patient will benefit from skilled therapeutic intervention in order to improve the following deficits and impairments:  Hypomobility, Increased edema, Decreased scar mobility, Decreased activity tolerance, Decreased strength, Pain, Difficulty walking, Decreased balance, Decreased range of motion, Improper body mechanics, Postural dysfunction, Impaired flexibility  Visit Diagnosis: Acute pain of left knee  Stiffness of left knee, not elsewhere classified  Muscle weakness (generalized)  Other abnormalities of gait and mobility     Problem List Patient Active Problem List   Diagnosis Date Noted  . Total knee replacement status, left 04/03/2019  . Degenerative arthritis of left knee 03/30/2019  . Asthma 02/17/2019    Kermit BaloMicah Leilyn Frayre, PTA 05/11/19 5:52 PM   [facility] [address]  [address] [place], [place], [address] Phone: [phone]   Fax:  [phone]  Name: Mosetta Anisrma R Schmidt-Soltero MRN: [id number] Date of Birth: [birth year]

## 2019-05-16 ENCOUNTER — Encounter: Payer: Self-pay | Admitting: Physical Therapy

## 2019-05-16 ENCOUNTER — Ambulatory Visit: Payer: Medicare Other | Admitting: Physical Therapy

## 2019-05-16 ENCOUNTER — Other Ambulatory Visit: Payer: Self-pay

## 2019-05-16 DIAGNOSIS — M6281 Muscle weakness (generalized): Secondary | ICD-10-CM

## 2019-05-16 DIAGNOSIS — M25562 Pain in left knee: Secondary | ICD-10-CM

## 2019-05-16 DIAGNOSIS — R2689 Other abnormalities of gait and mobility: Secondary | ICD-10-CM

## 2019-05-16 DIAGNOSIS — M25662 Stiffness of left knee, not elsewhere classified: Secondary | ICD-10-CM

## 2019-05-16 NOTE — Therapy (Signed)
[facility] [address]  [place], [place], [address] Phone: [phone]   Fax:  [phone]  Physical Therapy Progress Note  Patient Details  Name: Carol Gilbert MRN: [id number] Date of Birth: 18-Aug-1951 Referring Provider (PT): Frederik Pear, MD   Progress Note Reporting Period 04/18/19 to 05/16/19  See note below for Objective Data and Assessment of Progress/Goals.      Encounter Date: 05/16/2019  PT End of Session - 05/16/19 1549    Visit Number  9    Number of Visits  17    Date for PT Re-Evaluation  06/13/19    Authorization Type  Medicare & AARP    PT Start Time  1791   pt late   PT Stop Time  1529    PT Time Calculation (min)  31 min    Activity Tolerance  Patient tolerated treatment well    Behavior During Therapy  WFL for tasks assessed/performed       Past Medical History:  Diagnosis Date  . Apnea   . Arthritis   . Asthma   . Diabetes mellitus without complication ([place])   . Diverticulitis   . Fibromyalgia   . Hypertension     Past Surgical History:  Procedure Laterality Date  . ABDOMINAL HYSTERECTOMY    . ABDOMINAL SURGERY    . APPENDECTOMY    . BARIATRIC SURGERY N/A 08/10/2018  . BREAST BIOPSY    . CESAREAN SECTION    . CHOLECYSTECTOMY    . HERNIA REPAIR    . SINUS SURGERY WITH INSTATRAK    . TOTAL KNEE ARTHROPLASTY Left 04/03/2019   Procedure: Left Knee Arthroplasty;  Surgeon: Frederik Pear, MD;  Location: [facility];  Service: Orthopedics;  Laterality: Left;  . WISDOM TOOTH EXTRACTION      There were no vitals filed for this visit.  Subjective Assessment - 05/16/19 1500    Subjective  Apologizes for being late- had a phone call with the nursing home that her mother is at in [place]. Mother has COVID-19. Reports 98% improvement in L knee.    Pertinent History  HTN, fibromyalgia, DM, asthma    Patient Stated Goals  work on walking and standing without pain    Currently  in Pain?  Yes    Pain Score  1     Pain Location  Knee    Pain Orientation  Left    Pain Descriptors / Indicators  Other (Comment)   unable to describe   Pain Type  Acute pain;Surgical pain         [facility] PT Assessment - 05/16/19 0001      Assessment   Medical Diagnosis  s/p L TKA    Referring Provider (PT)  Frederik Pear, MD    Onset Date/Surgical Date  04/03/19      AROM   Left Knee Extension  0    Left Knee Flexion  110      PROM   Left Knee Extension  0    Left Knee Flexion  114      Strength   Right Hip Flexion  4/5    Right Hip ABduction  4/5    Right Hip ADduction  4/5    Left Hip Flexion  4/5    Left Hip ABduction  4/5    Left Hip ADduction  4/5    Right Knee Flexion  4/5    Right Knee Extension  5/5  Left Knee Flexion  4+/5    Left Knee Extension  5/5    Right Ankle Dorsiflexion  5/5    Right Ankle Plantar Flexion  4+/5    Left Ankle Dorsiflexion  5/5    Left Ankle Plantar Flexion  4+/5                   [facility] Adult PT Treatment/Exercise - 05/16/19 0001      Knee/Hip Exercises: Aerobic   Recumbent Bike  L1 x 6 min (full revolutions)      Knee/Hip Exercises: Supine   Bridges  Both;10 reps    Bridges Limitations  decreased ROM      Knee/Hip Exercises: Sidelying   Hip ADduction  Strengthening;Right;Left;1 set;10 reps    Hip ADduction Limitations  opposite LE propped on bolster    Clams  x10 each LE   manual cues to avoid hip rotation            PT Education - 05/16/19 1548    Education Details  discussion on objective progress with PT; update to HEP    Person(s) Educated  Patient    Methods  Explanation;Demonstration;Tactile cues;Verbal cues;Handout    Comprehension  Verbalized understanding;Returned demonstration       PT Short Term Goals - 04/25/19 1317      PT SHORT TERM GOAL #1   Title  Patient to be independent with intial HEP.    Time  2    Period  Weeks    Status  Achieved    Target Date  05/02/19        PT  Long Term Goals - 05/16/19 1506      PT LONG TERM GOAL #1   Title  Patient to be independent with advanced HEP.    Time  4    Period  Weeks    Status  Partially Met   met for current   Target Date  06/13/19      PT LONG TERM GOAL #2   Title  Patient to demonstrate L knee AROM/PROM 0-120 degrees.    Time  4    Period  Weeks    Status  Partially Met   AROM 0-110 degrees, PROM 0-114 degrees   Target Date  06/13/19      PT LONG TERM GOAL #3   Title  Patient to demonstrate B LE strength >=4+/5.    Time  4    Period  Weeks    Status  Partially Met   improvements in L hip flexion, L knee flexion/extension, B ankle dorsiflexion, L ankle plantarflexion   Target Date  06/13/19      PT LONG TERM GOAL #4   Title  Patient to demonstrate symmetrical weight shift, step length, and knee flexion with ambulation with LRAD.    Time  4    Period  Weeks    Status  Partially Met   still demonstrating mild antalgia and lateral trunk lean with ambulation without AD   Target Date  06/13/19      PT LONG TERM GOAL #5   Title  Patient to report tolerance of 1 hour of walking/standing without onset of pain.    Time  4    Period  Weeks    Status  Partially Met   tolerates a grocery trip 30-45 min before onset of pain   Target Date  06/13/19      PT LONG TERM GOAL #6   Title  Patient  to return to fitness program or activity with modifications as needed in order to continue fitness regimen.    Time  4    Period  Weeks    Status  Partially Met   signed up for a water aerobics class in Oct   Target Date  06/13/19            Plan - 05/16/19 1552    Clinical Impression Statement  Patient reported 98% improvement in L knee since starting PT. Notes that she would like to continue working on her LE strength and return to a fitness program. L knee AROM measured 0-110 degrees, with PROM 0-114 degrees. Strength testing revealed improvements in L hip flexion, L knee flexion/extension, B ankle  dorsiflexion, L ankle plantarflexion. Most remaining weakness is located in B hip musculature. Patient now tolerates a grocery trip lasting 30-45 min before onset of pain. Still demonstrates mild antalgia and lateral trunk lean with ambulation without AD. Patient has signed up for a water aerobics class in Oct in order to continue a fitness regimen. Updated HEP to address hip weakness. Patient required intermittent cues to correct form but reported understanding. Patient is showing good progress towards goals at this time. Would benefit from continued skilled PT services 2x/week for 4 weeks to address remaining goals.    Comorbidities  HTN, fibromyalgia, DM, asthma    Rehab Potential  Good    PT Frequency  2x / week    PT Duration  4 weeks    PT Treatment/Interventions  ADLs/Self Care Home Management;Cryotherapy;Electrical Stimulation;Moist Heat;Balance training;Therapeutic activities;Stair training;Gait training;Ultrasound;Neuromuscular re-education;Patient/family education;Therapeutic exercise;Manual techniques;Vasopneumatic Device;Taping;Energy conservation;Dry needling;Passive range of motion;Scar mobilization    PT Next Visit Plan  progress LE strengthening and knee ROM    Consulted and Agree with Plan of Care  Patient       Patient will benefit from skilled therapeutic intervention in order to improve the following deficits and impairments:  Hypomobility, Increased edema, Decreased scar mobility, Decreased activity tolerance, Decreased strength, Pain, Difficulty walking, Decreased balance, Decreased range of motion, Improper body mechanics, Postural dysfunction, Impaired flexibility  Visit Diagnosis: Acute pain of left knee  Stiffness of left knee, not elsewhere classified  Muscle weakness (generalized)  Other abnormalities of gait and mobility     Problem List Patient Active Problem List   Diagnosis Date Noted  . Total knee replacement status, left 04/03/2019  . Degenerative  arthritis of left knee 03/30/2019  . Asthma 02/17/2019    Janene Harvey, PT, DPT 05/16/19 3:59 PM   [facility] [address]  [place], [place], [address] Phone: [phone]   Fax:  [phone]  Name: REVA PINKLEY MRN: [id number] Date of Birth: Feb 24, 1951

## 2019-05-18 ENCOUNTER — Ambulatory Visit: Payer: Medicare Other

## 2019-05-18 ENCOUNTER — Other Ambulatory Visit: Payer: Self-pay

## 2019-05-18 DIAGNOSIS — R2689 Other abnormalities of gait and mobility: Secondary | ICD-10-CM

## 2019-05-18 DIAGNOSIS — M25562 Pain in left knee: Secondary | ICD-10-CM | POA: Diagnosis not present

## 2019-05-18 DIAGNOSIS — M25662 Stiffness of left knee, not elsewhere classified: Secondary | ICD-10-CM

## 2019-05-18 DIAGNOSIS — M6281 Muscle weakness (generalized): Secondary | ICD-10-CM

## 2019-05-18 NOTE — Therapy (Signed)
[facility] [address]  [place], [place], [address] Phone: [phone]   Fax:  [phone]  Physical Therapy Treatment  Patient Details  Name: Carol Gilbert MRN: [id number] Date of Birth: 1951-06-09 Referring Provider (PT): Frederik Pear, MD   Encounter Date: 05/18/2019  PT End of Session - 05/18/19 1022    Visit Number  10    Number of Visits  17    Date for PT Re-Evaluation  06/13/19    Authorization Type  Medicare & AARP    PT Start Time  0254    PT Stop Time  1055    PT Time Calculation (min)  40 min    Activity Tolerance  Patient tolerated treatment well    Behavior During Therapy  [facility] for tasks assessed/performed       Past Medical History:  Diagnosis Date  . Apnea   . Arthritis   . Asthma   . Diabetes mellitus without complication ([place])   . Diverticulitis   . Fibromyalgia   . Hypertension     Past Surgical History:  Procedure Laterality Date  . ABDOMINAL HYSTERECTOMY    . ABDOMINAL SURGERY    . APPENDECTOMY    . BARIATRIC SURGERY N/A 08/10/2018  . BREAST BIOPSY    . CESAREAN SECTION    . CHOLECYSTECTOMY    . HERNIA REPAIR    . SINUS SURGERY WITH INSTATRAK    . TOTAL KNEE ARTHROPLASTY Left 04/03/2019   Procedure: Left Knee Arthroplasty;  Surgeon: Frederik Pear, MD;  Location: [facility];  Service: Orthopedics;  Laterality: Left;  . WISDOM TOOTH EXTRACTION      There were no vitals filed for this visit.  Subjective Assessment - 05/18/19 1021    Subjective  Pt. noting she feels poor weather is making her L knee sore this morning.    Pertinent History  HTN, fibromyalgia, DM, asthma    Patient Stated Goals  work on walking and standing without pain    Currently in Pain?  Yes    Pain Score  4     Pain Location  Knee    Pain Orientation  Left    Pain Descriptors / Indicators  Sore    Pain Type  Acute pain;Surgical pain    Aggravating Factors   Poor weather    Pain Relieving Factors   rest    Multiple Pain Sites  No                       [facility] Adult PT Treatment/Exercise - 05/18/19 0001      Lumbar Exercises: Supine   Bridge with clamshell  3 seconds   x 12 reps    Bridge with [employer] Limitations  + isometric hip abd/ER into green TB at knees       Knee/Hip Exercises: Stretches   Hip Flexor Stretch  Left;2 reps;30 seconds    Hip Flexor Stretch Limitations  mod thomas stretch + strap       Knee/Hip Exercises: Aerobic   Recumbent Bike  L1 x 6 min (full revolutions)      Knee/Hip Exercises: Standing   Heel Raises  Both;15 reps;Left    Heel Raises Limitations  B con/75% wt. shift over L on eccentric     Lateral Step Up  Right;Left;10 reps;Step Height: 6";Hand Hold: 2    Lateral Step Up Limitations  TM rail support     Forward Step Up  Left;10 reps;Hand Hold: 2;Step Height: 8"    Forward Step Up Limitations  chair and TM UE support     Step Down  Left;Hand Hold: 2;Step Height: 4"   x 12 resp    Step Down Limitations  at TM       Knee/Hip Exercises: Supine   Straight Leg Raises  Left;15 reps    Other Supine Knee/Hip Exercises  Hooklying alternating green TB clam shell x 15 rpes each way                PT Short Term Goals - 04/25/19 1317      PT SHORT TERM GOAL #1   Title  Patient to be independent with intial HEP.    Time  2    Period  Weeks    Status  Achieved    Target Date  05/02/19        PT Long Term Goals - 05/16/19 1506      PT LONG TERM GOAL #1   Title  Patient to be independent with advanced HEP.    Time  4    Period  Weeks    Status  Partially Met   met for current   Target Date  06/13/19      PT LONG TERM GOAL #2   Title  Patient to demonstrate L knee AROM/PROM 0-120 degrees.    Time  4    Period  Weeks    Status  Partially Met   AROM 0-110 degrees, PROM 0-114 degrees   Target Date  06/13/19      PT LONG TERM GOAL #3   Title  Patient to demonstrate B LE strength >=4+/5.    Time  4    Period   Weeks    Status  Partially Met   improvements in L hip flexion, L knee flexion/extension, B ankle dorsiflexion, L ankle plantarflexion   Target Date  06/13/19      PT LONG TERM GOAL #4   Title  Patient to demonstrate symmetrical weight shift, step length, and knee flexion with ambulation with LRAD.    Time  4    Period  Weeks    Status  Partially Met   still demonstrating mild antalgia and lateral trunk lean with ambulation without AD   Target Date  06/13/19      PT LONG TERM GOAL #5   Title  Patient to report tolerance of 1 hour of walking/standing without onset of pain.    Time  4    Period  Weeks    Status  Partially Met   tolerates a grocery trip 30-45 min before onset of pain   Target Date  06/13/19      PT LONG TERM GOAL #6   Title  Patient to return to fitness program or activity with modifications as needed in order to continue fitness regimen.    Time  4    Period  Weeks    Status  Partially Met   signed up for a water aerobics class in Oct   Target Date  06/13/19            Plan - 05/18/19 1022    Clinical Impression Statement  Delcie to see MD for f/u next week.  Session today focused on forward, lateral, and step-down training for improved quad eccentric control and navigation over objects with gait.  Pt. demonstrating difficulty with 8" forward step up requiring heavy B UE assistance due to reported  weakness with this motion.  Feels like the rainy weather today may be causing increased L knee pain.  Tolerated all activities in session without significant rise in knee pain and pt. declining modalities to end session.  Continues to demonstrate functional instability with stepping and standing activities likely due to ongoing proximal hip weakness which was addressed today with bridge/abduction and other strengthening activities.  Will continue to benefit from further skilled physical therapy for improved functional strength and mobility.    Personal Factors and  Comorbidities  Age;Comorbidity 3+;Time since onset of injury/illness/exacerbation;Fitness;Past/Current Experience    Comorbidities  HTN, fibromyalgia, DM, asthma    Rehab Potential  Good    PT Treatment/Interventions  ADLs/Self Care Home Management;Cryotherapy;Electrical Stimulation;Moist Heat;Balance training;Therapeutic activities;Stair training;Gait training;Ultrasound;Neuromuscular re-education;Patient/family education;Therapeutic exercise;Manual techniques;Vasopneumatic Device;Taping;Energy conservation;Dry needling;Passive range of motion;Scar mobilization    PT Next Visit Plan  progress LE strengthening and knee ROM    Consulted and Agree with Plan of Care  Patient       Patient will benefit from skilled therapeutic intervention in order to improve the following deficits and impairments:  Hypomobility, Increased edema, Decreased scar mobility, Decreased activity tolerance, Decreased strength, Pain, Difficulty walking, Decreased balance, Decreased range of motion, Improper body mechanics, Postural dysfunction, Impaired flexibility  Visit Diagnosis: Acute pain of left knee  Stiffness of left knee, not elsewhere classified  Muscle weakness (generalized)  Other abnormalities of gait and mobility     Problem List Patient Active Problem List   Diagnosis Date Noted  . Total knee replacement status, left 04/03/2019  . Degenerative arthritis of left knee 03/30/2019  . Asthma 02/17/2019    Bess Harvest, PTA 05/18/19 12:36 PM   [facility] [address]  [place], [place], [address] Phone: [phone]   Fax:  [phone]  Name: Carol Gilbert MRN: [id number] Date of Birth: 11-Jan-1951

## 2019-05-22 ENCOUNTER — Ambulatory Visit: Payer: Medicare Other | Admitting: Nurse Practitioner

## 2019-05-22 ENCOUNTER — Other Ambulatory Visit: Payer: Self-pay

## 2019-05-22 ENCOUNTER — Encounter: Payer: Self-pay | Admitting: Adult Health

## 2019-05-22 ENCOUNTER — Ambulatory Visit (INDEPENDENT_AMBULATORY_CARE_PROVIDER_SITE_OTHER): Payer: Medicare Other | Admitting: Adult Health

## 2019-05-22 ENCOUNTER — Ambulatory Visit (HOSPITAL_BASED_OUTPATIENT_CLINIC_OR_DEPARTMENT_OTHER)
Admission: RE | Admit: 2019-05-22 | Discharge: 2019-05-22 | Disposition: A | Discharge status: Home / Self Care | Payer: Medicare Other | Source: Ambulatory Visit | Attending: Internal Medicine | Admitting: Internal Medicine

## 2019-05-22 DIAGNOSIS — J45909 Unspecified asthma, uncomplicated: Secondary | ICD-10-CM

## 2019-05-22 DIAGNOSIS — Z23 Encounter for immunization: Secondary | ICD-10-CM | POA: Diagnosis not present

## 2019-05-22 DIAGNOSIS — R918 Other nonspecific abnormal finding of lung field: Secondary | ICD-10-CM | POA: Diagnosis not present

## 2019-05-22 DIAGNOSIS — J310 Chronic rhinitis: Secondary | ICD-10-CM

## 2019-05-22 DIAGNOSIS — R911 Solitary pulmonary nodule: Secondary | ICD-10-CM | POA: Insufficient documentation

## 2019-05-22 MED ORDER — SYMBICORT 160-4.5 MCG/ACT IN AERO: 2.0000 | INHALATION_SPRAY | Freq: Two times a day (BID) | RESPIRATORY_TRACT | 5 refills | Status: DC

## 2019-05-22 NOTE — Patient Instructions (Addendum)
Continue on Symbicort 2 puffs Twice daily , rinse after use.  Continue on Singulair 10mg  At bedtime  .  Continue on Flonase 2 puffs daily As needed   Albuterol inhaler As needed  Wheezing .  Flu shot today .  Activity as tolerated.  CT chest in 1 year .  Follow up with Primary Provider for findings on CT chest (Abnormalities -Adrenal , Kidney , Aorta )  Follow up in 6 months with Dr. Chase Caller or Vania Rosero NP and As needed .  Please contact office for sooner follow up if symptoms do not improve or worsen or seek emergency care

## 2019-05-22 NOTE — Assessment & Plan Note (Signed)
Compensated on present regimen  Plan  Patient Instructions  Continue on Symbicort 2 puffs Twice daily , rinse after use.  Continue on Singulair 10mg  At bedtime  .  Continue on Flonase 2 puffs daily As needed   Albuterol inhaler As needed  Wheezing .  Flu shot today .  Activity as tolerated.  CT chest in 1 year .  Follow up with Primary Provider for findings on CT chest (Abnormalities -Adrenal , Kidney , Aorta )  Follow up in 6 months with Dr. Chase Caller or [place] NP and As needed .  Please contact office for sooner follow up if symptoms do not improve or worsen or seek emergency care

## 2019-05-22 NOTE — Assessment & Plan Note (Signed)
Stable on CT chest  Incidental findings on CT chest to discuss with PCP   Plan Patient Instructions  Continue on Symbicort 2 puffs Twice daily , rinse after use.  Continue on Singulair 10mg  At bedtime  .  Continue on Flonase 2 puffs daily As needed   Albuterol inhaler As needed  Wheezing .  Flu shot today .  Activity as tolerated.  CT chest in 1 year .  Follow up with Primary Provider for findings on CT chest (Abnormalities -Adrenal , Kidney , Aorta )  Follow up in 6 months with Dr. Chase Caller or Howard [place] NP and As needed .  Please contact office for sooner follow up if symptoms do not improve or worsen or seek emergency care

## 2019-05-22 NOTE — Progress Notes (Signed)
@Patient  ID: Carol Gilbert, female    DOB: Oct 27, 1950, 68 y.o.   MRN: [id number]  Chief Complaint  Patient presents with   Follow-up    Asthma     Referring provider: No ref. provider found  HPI: 68 year old female never smoker followed for moderate persistent asthma, chronic rhinitis, obstructive sleep apnea (not on CPAP )  Medical history significant for morbid obesity status post bariatric surgery 07/2018 (down 90lbs) .    TEST/EVENTS :  FENO 08/2018 -10 ppb  IgE 09/19/18  -76 Eosinophils 100  HST 10/25/18 - AHI 6.8, SpO2 low 87%  10/24/2018 high-res CT chest no ILD, 7 mm right lower lobe nodule.  Left adrenal adenoma, ectatic 4 cm ascending thoracic aorta  05/22/2019 Follow up : Asthma, CR , lung nodules Patient returns for a 25-month follow-up.  Patient has underlying moderate persistent asthma. She remains on Symbicort twice daily and Singulair daily . Marland Kitchen  She has chronic rhinitis is on Flonase As needed   She says overall breathing is doing okay.  She denies a flare of cough or wheezing.  Says she uses her albuterol on occasion.  Patient has known pulmonary nodules noted on high-resolution CT chest February 2020.  Follow-up CT chest done this morning showed stable 7 mm right lower lobe nodule.  2 mm left upper lobe nodule.  Incidental findings were stable from February with left adrenal adenoma, 4 cm ectatic ascending thoracic aorta.  Right kidney lesions.  We discussed that she will need to discuss incidental findings with primary care provider.  Would like to get flu shot today  She is working on weight loss.  She had gastric bypass surgery and is down 90 pounds.  She is planning to return to the pool.  A repeat home sleep study in February 2020 that showed mild OSA .     Allergies  Allergen Reactions   Shellfish Allergy Anaphylaxis   Pollen Extract Other (See Comments)    Dust and smoke/ causes watery eyes; sneezing    Immunization History  Administered  Date(s) Administered   Fluad Quad(high Dose 65+) 05/22/2019   Influenza, High Dose Seasonal PF 05/06/2018   Influenza, Seasonal, Injecte, Preservative Fre 05/18/2017   Pneumococcal Conjugate-13 08/25/2018   Pneumococcal Polysaccharide-23 08/02/2017   Td 03/07/2019   Zoster Recombinat (Shingrix) 03/05/2019, 05/06/2019    Past Medical History:  Diagnosis Date   Apnea    Arthritis    Asthma    Diabetes mellitus without complication ([place])    Diverticulitis    Fibromyalgia    Hypertension     Tobacco History: Social History   Tobacco Use  Smoking Status Never Smoker  Smokeless Tobacco Never Used   Counseling given: Not Answered   Outpatient Medications Prior to Visit  Medication Sig Dispense Refill   albuterol (PROVENTIL HFA;VENTOLIN HFA) 108 (90 Base) MCG/ACT inhaler Inhale 2 puffs into the lungs every 6 (six) hours as needed for wheezing or shortness of breath. 1 Inhaler 6   Ascorbic Acid (VITAMIN C) 1000 MG tablet Take 1,000 mg by mouth daily.     aspirin EC 81 MG tablet Take 1 tablet (81 mg total) by mouth 2 (two) times daily. 60 tablet 0   b complex vitamins tablet Take 1 tablet by mouth every evening.      Biotin 5000 MCG TABS Take 15,000 mcg by mouth every evening.     bisacodyl (DULCOLAX) 5 MG EC tablet Take 5 mg by mouth 2 (two) times daily as  needed for moderate constipation (constipation.).     Calcium-Phosphorus-Vitamin D (CITRACAL +D3 PO) Take 1 tablet by mouth every evening.     cetirizine (KLS ALLER-TEC) 10 MG tablet Take 10 mg by mouth daily.     Cholecalciferol (VITAMIN D3) 50 MCG (2000 UT) TABS Take 2,000 Units by mouth daily.     Docusate Calcium (STOOL SOFTENER PO) Take by mouth as needed.     docusate sodium (COLACE) 100 MG capsule Take 300 mg by mouth at bedtime.     DULoxetine (CYMBALTA) 60 MG capsule Take 60 mg by mouth daily.     fluticasone (FLONASE) 50 MCG/ACT nasal spray Place 2 sprays into both nostrils daily. (Patient  taking differently: Place 2 sprays into both nostrils daily as needed for allergies. ) 16 g 2   Methylcellulose, Laxative, (CITRUCEL) 500 MG TABS Take 500 mg by mouth 2 (two) times a day. Morning & Evening     montelukast (SINGULAIR) 10 MG tablet Take 10 mg by mouth daily.      MUCINEX MAXIMUM STRENGTH 1200 MG TB12 Take 1,200 mg by mouth every evening.     Multiple Vitamin (MULTIVITAMIN WITH MINERALS) TABS tablet Take 1 tablet by mouth daily. One-A-Day Women's     saccharomyces boulardii (FLORASTOR) 250 MG capsule Take 250 mg by mouth 2 (two) times daily.     SYMBICORT 160-4.5 MCG/ACT inhaler Inhale 2 puffs into the lungs 2 (two) times a day.      levalbuterol (XOPENEX HFA) 45 MCG/ACT inhaler Inhale 1-2 puffs into the lungs every 4 (four) hours as needed for wheezing.     oxyCODONE-acetaminophen (PERCOCET/ROXICET) 5-325 MG tablet Take 1 tablet by mouth every 4 (four) hours as needed for severe pain. 30 tablet 0   tiZANidine (ZANAFLEX) 2 MG tablet Take 1 tablet (2 mg total) by mouth every 6 (six) hours as needed. 60 tablet 0   No facility-administered medications prior to visit.      Review of Systems:   Constitutional:   No  weight loss, night sweats,  Fevers, chills, fatigue, or  lassitude.  HEENT:   No headaches,  Difficulty swallowing,  Tooth/dental problems, or  Sore throat,                No sneezing, itching, ear ache, nasal congestion, post nasal drip,   CV:  No chest pain,  Orthopnea, PND, swelling in lower extremities, anasarca, dizziness, palpitations, syncope.   GI  No heartburn, indigestion, abdominal pain, nausea, vomiting, diarrhea, change in bowel habits, loss of appetite, bloody stools.   Resp: No shortness of breath with exertion or at rest.  No excess mucus, no productive cough,  No non-productive cough,  No coughing up of blood.  No change in color of mucus.  No wheezing.  No chest wall deformity  Skin: no rash or lesions.  GU: no dysuria, change in color of  urine, no urgency or frequency.  No flank pain, no hematuria   MS:  No joint pain or swelling.  No decreased range of motion.  No back pain.    Physical Exam  BP 116/68 (BP Location: Left Arm, Cuff Size: Normal)    Pulse 76    Temp (!) 97 F (36.1 C) (Temporal)    Ht 5\' 1"  (1.549 m)    Wt 193 lb 6.4 oz (87.7 kg)    SpO2 98%    BMI 36.54 kg/m   GEN: A/Ox3; pleasant , NAD, obese  HEENT:  [place]/AT, NOSE-clear, THROAT-clear, no  lesions, no postnasal drip or exudate noted.   NECK:  Supple w/ fair ROM; no JVD; normal carotid impulses w/o bruits; no thyromegaly or nodules palpated; no lymphadenopathy.    RESP  Clear  P & A; w/o, wheezes/ rales/ or rhonchi. no accessory muscle use, no dullness to percussion  CARD:  RRR, no m/r/g, no peripheral edema, pulses intact, no cyanosis or clubbing.  GI:   Soft & nt; nml bowel sounds; no organomegaly or masses detected.   Musco: Warm bil, no deformities or joint swelling noted.   Neuro: alert, no focal deficits noted.    Skin: Warm, no lesions or rashes    Lab Results:  CBC    Component Value Date/Time   WBC 12.7 (H) 04/05/2019 0258   RBC 4.16 04/05/2019 0258   HGB 12.3 04/05/2019 0258   HCT 39.3 04/05/2019 0258   PLT 240 04/05/2019 0258   MCV 94.5 04/05/2019 0258   MCH 29.6 04/05/2019 0258   MCHC 31.3 04/05/2019 0258   RDW 13.5 04/05/2019 0258   LYMPHSABS 1.9 03/31/2019 1030   MONOABS 0.5 03/31/2019 1030   EOSABS 0.1 03/31/2019 1030   BASOSABS 0.1 03/31/2019 1030    BMET    Component Value Date/Time   NA 135 04/04/2019 0244   K 4.2 04/04/2019 0244   CL 101 04/04/2019 0244   CO2 28 04/04/2019 0244   GLUCOSE 160 (H) 04/04/2019 0244   BUN 14 04/04/2019 0244   CREATININE 0.56 04/04/2019 0244   CALCIUM 9.1 04/04/2019 0244   GFRNONAA >60 04/04/2019 0244   GFRAA >60 04/04/2019 0244    BNP No results found for: BNP  ProBNP No results found for: PROBNP  Imaging: Ct Chest Wo Contrast  Result Date: 05/22/2019 CLINICAL  DATA:  Lung nodule. EXAM: CT CHEST WITHOUT CONTRAST TECHNIQUE: Multidetector CT imaging of the chest was performed following the standard protocol without IV contrast. COMPARISON:  10/24/2018. FINDINGS: Cardiovascular: Atherosclerotic calcification of the aorta and coronary arteries. Ascending aorta measures 4.0 cm. Heart size normal. No pericardial effusion. Mediastinum/Nodes: No pathologically enlarged mediastinal or axillary lymph nodes. Hilar regions are difficult to definitively evaluate without IV contrast but appear grossly unremarkable. Esophagus is grossly unremarkable. Lungs/Pleura: Pleuroparenchymal scarring in the lower right hemithorax. 7 mm ground-glass nodule in the right lower lobe (4/88), unchanged. 2 mm peripheral left upper lobe nodule (4/24), also unchanged. Lungs are otherwise clear. No pleural fluid. Airway is unremarkable. Upper Abdomen: Visualized portions of the liver and right adrenal gland are unremarkable. Fluid density left adrenal lesion measures 3.1 cm, as before. Low-attenuation lesions in the right kidney measure up to approximately 1.7 cm and are difficult to further characterize without post-contrast imaging. Visualized portions of the spleen and pancreas are unremarkable. Gastric sleeve. No upper abdominal adenopathy. Cholecystectomy. Musculoskeletal: Degenerative changes in the spine. No worrisome lytic or sclerotic lesions. IMPRESSION: 1. 7 mm ground-glass right lower lobe nodule is stable. Annual follow-up CT chest without contrast is recommended until 5 years of documented stability. This recommendation follows the consensus statement: Guidelines for Management of Small Pulmonary Nodules Detected on CT Images: From the Fleischner Society 2017; Radiology 2017; 284:228-243. 2. Ascending Aortic aneurysm NOS (ICD10-I71.9). Recommend annual imaging followup by CTA or MRA. This recommendation follows 2010 ACCF/AHA/AATS/ACR/ASA/SCA/SCAI/SIR/STS/SVM Guidelines for the Diagnosis and  Management of Patients with Thoracic Aortic Disease. Circulation. 2010; 121: G891-Q945. Aortic aneurysm NOS (ICD10-I71.9). 3. Left adrenal adenoma. 4. Aortic atherosclerosis (ICD10-170.0). Coronary artery calcification. Electronically Signed   By: Leanna Battles M.D.  On: 05/22/2019 10:16      PFT Results Latest Ref Rng & Units 11/01/2018  FVC-Pre L 2.36  FVC-Predicted Pre % 85  FVC-Post L 2.38  FVC-Predicted Post % 85  Pre FEV1/FVC % % 73  Post FEV1/FCV % % 70  FEV1-Pre L 1.72  FEV1-Predicted Pre % 81  FEV1-Post L 1.67  DLCO UNC% % 140  DLCO COR %Predicted % 135    Lab Results  Component Value Date   NITRICOXIDE 10 09/26/2018        Assessment & Plan:   Asthma Compensated on present regimen  Plan  Patient Instructions  Continue on Symbicort 2 puffs Twice daily , rinse after use.  Continue on Singulair 10mg  At bedtime  .  Continue on Flonase 2 puffs daily As needed   Albuterol inhaler As needed  Wheezing .  Flu shot today .  Activity as tolerated.  CT chest in 1 year .  Follow up with Primary Provider for findings on CT chest (Abnormalities -Adrenal , Kidney , Aorta )  Follow up in 6 months with Dr. or Ronalda Walpole NP and As needed .  Please contact office for sooner follow up if symptoms do not improve or worsen or seek emergency care         Lung nodules Stable on CT chest  Incidental findings on CT chest to discuss with PCP   Plan Patient Instructions  Continue on Symbicort 2 puffs Twice daily , rinse after use.  Continue on Singulair 10mg  At bedtime  .  Continue on Flonase 2 puffs daily As needed   Albuterol inhaler As needed  Wheezing .  Flu shot today .  Activity as tolerated.  CT chest in 1 year .  Follow up with Primary Provider for findings on CT chest (Abnormalities -Adrenal , Kidney , Aorta )  Follow up in 6 months with Dr. Marchelle Gearing or Maragret Vanacker NP and As needed .  Please contact office for sooner follow up if symptoms do not improve or  worsen or seek emergency care         Chronic rhinitis Stable  Use flonase As needed       , NP 05/22/2019

## 2019-05-22 NOTE — Assessment & Plan Note (Signed)
Stable  Use flonase As needed

## 2019-05-23 ENCOUNTER — Ambulatory Visit: Payer: Medicare Other

## 2019-05-26 ENCOUNTER — Other Ambulatory Visit: Payer: Self-pay

## 2019-05-26 ENCOUNTER — Encounter: Payer: Self-pay | Admitting: Physical Therapy

## 2019-05-26 ENCOUNTER — Ambulatory Visit: Payer: Medicare Other | Admitting: Physical Therapy

## 2019-05-26 DIAGNOSIS — M25562 Pain in left knee: Secondary | ICD-10-CM | POA: Diagnosis not present

## 2019-05-26 DIAGNOSIS — M25662 Stiffness of left knee, not elsewhere classified: Secondary | ICD-10-CM

## 2019-05-26 DIAGNOSIS — R2689 Other abnormalities of gait and mobility: Secondary | ICD-10-CM

## 2019-05-26 DIAGNOSIS — M6281 Muscle weakness (generalized): Secondary | ICD-10-CM

## 2019-05-26 NOTE — Therapy (Signed)
[facility] [address]  [place], [place], [address] Phone: [phone]   Fax:  [phone]  Physical Therapy Treatment  Patient Details  Name: Carol Gilbert MRN: [id number] Date of Birth: 06-10-1951 Referring Provider (PT): Frederik Pear, MD   Encounter Date: 05/26/2019  PT End of Session - 05/26/19 1148    Visit Number  11    Number of Visits  17    Date for PT Re-Evaluation  06/13/19    Authorization Type  Medicare & AARP    PT Start Time  1057    PT Stop Time  1145    PT Time Calculation (min)  48 min    Activity Tolerance  Patient tolerated treatment well    Behavior During Therapy  [facility] for tasks assessed/performed       Past Medical History:  Diagnosis Date  . Apnea   . Arthritis   . Asthma   . Diabetes mellitus without complication ([place])   . Diverticulitis   . Fibromyalgia   . Hypertension     Past Surgical History:  Procedure Laterality Date  . ABDOMINAL HYSTERECTOMY    . ABDOMINAL SURGERY    . APPENDECTOMY    . BARIATRIC SURGERY N/A 08/10/2018  . BREAST BIOPSY    . CESAREAN SECTION    . CHOLECYSTECTOMY    . HERNIA REPAIR    . SINUS SURGERY WITH INSTATRAK    . TOTAL KNEE ARTHROPLASTY Left 04/03/2019   Procedure: Left Knee Arthroplasty;  Surgeon: Frederik Pear, MD;  Location: [facility];  Service: Orthopedics;  Laterality: Left;  . WISDOM TOOTH EXTRACTION      There were no vitals filed for this visit.  Subjective Assessment - 05/26/19 1059    Subjective  Went to see her MD who was pleased with her progress in the L knee. Has another appointment in 6 weeks, and within another 6 weeks she will have another appointmen to talk about the R TKA.    Pertinent History  HTN, fibromyalgia, DM, asthma    Patient Stated Goals  work on walking and standing without pain    Currently in Pain?  Yes    Pain Score  1     Pain Location  Knee    Pain Orientation  Left    Pain Descriptors / Indicators   Aching    Pain Type  Acute pain;Surgical pain                       [facility] Adult PT Treatment/Exercise - 05/26/19 0001      Knee/Hip Exercises: Aerobic   Recumbent Bike  L1 x 6 min (full revolutions)      Knee/Hip Exercises: Standing   Hip Flexion  Stengthening;Left;1 set;10 reps;Knee straight    Hip Flexion Limitations  yellow loop; cues to slow down    Hip ADduction  Strengthening;Left;1 set;10 reps    Hip ADduction Limitations  with green TB and 2 ski poles   c/o difficulty   Hip Abduction  Left;10 reps;Stengthening;Knee straight    Abduction Limitations  yellow loop; cues to slow down    Hip Extension  Left;10 reps;Knee straight    Extension Limitations  yellow loop; cues to slow down    Functional Squat  1 set;10 reps    Functional Squat Limitations  at counter with ball squueze    Other Standing Knee Exercises  L LE adductor skaters on towel x5  Knee/Hip Exercises: Seated   Long Arc Quad  Left;Strengthening;Weights;15 reps    Long Arc Quad Limitations  green band    Other Seated Knee/Hip Exercises  sitting ball squeeze + LAQ x10 total      Knee/Hip Exercises: Supine   Heel Slides  Left;10 reps;AAROM    Heel Slides Limitations  10x3" strap and peanut ball             PT Education - 05/26/19 1148    Education Details  discussion on exercises that will provide most benefit and possible modifications; heavy HEP review and consolidaiton    Person(s) Educated  Patient    Methods  Explanation;Demonstration;Tactile cues;Verbal cues;Handout    Comprehension  Verbalized understanding;Returned demonstration       PT Short Term Goals - 04/25/19 1317      PT SHORT TERM GOAL #1   Title  Patient to be independent with intial HEP.    Time  2    Period  Weeks    Status  Achieved    Target Date  05/02/19        PT Long Term Goals - 05/16/19 1506      PT LONG TERM GOAL #1   Title  Patient to be independent with advanced HEP.    Time  4    Period   Weeks    Status  Partially Met   met for current   Target Date  06/13/19      PT LONG TERM GOAL #2   Title  Patient to demonstrate L knee AROM/PROM 0-120 degrees.    Time  4    Period  Weeks    Status  Partially Met   AROM 0-110 degrees, PROM 0-114 degrees   Target Date  06/13/19      PT LONG TERM GOAL #3   Title  Patient to demonstrate B LE strength >=4+/5.    Time  4    Period  Weeks    Status  Partially Met   improvements in L hip flexion, L knee flexion/extension, B ankle dorsiflexion, L ankle plantarflexion   Target Date  06/13/19      PT LONG TERM GOAL #4   Title  Patient to demonstrate symmetrical weight shift, step length, and knee flexion with ambulation with LRAD.    Time  4    Period  Weeks    Status  Partially Met   still demonstrating mild antalgia and lateral trunk lean with ambulation without AD   Target Date  06/13/19      PT LONG TERM GOAL #5   Title  Patient to report tolerance of 1 hour of walking/standing without onset of pain.    Time  4    Period  Weeks    Status  Partially Met   tolerates a grocery trip 30-45 min before onset of pain   Target Date  06/13/19      PT LONG TERM GOAL #6   Title  Patient to return to fitness program or activity with modifications as needed in order to continue fitness regimen.    Time  4    Period  Weeks    Status  Partially Met   signed up for a water aerobics class in Oct   Target Date  06/13/19            Plan - 05/26/19 1149    Clinical Impression Statement  Patient reporting that MD was pleased with her L knee progress  thus far, and will be planning for discussion on the next knee replacement to R knee within the next 12 weeks. Patient reporting difficulty performing newer hip strengthening exercises at home d/t her bed being too soft, thus worked with patient on hip strengthening exercises in sitting and standing. Patient also reporting some exercises to be too easy, thus worked on increasing banded  resistance and provided a review and consolidation of all HEP exercises. Patient without pain with all ther-ex performed today, but did report difficulty and muscle fatigue with standing 3 way hip kicker. Patient reported understanding of all edu provided today and with no complaints at end of session.    Comorbidities  HTN, fibromyalgia, DM, asthma    Rehab Potential  Good    PT Treatment/Interventions  ADLs/Self Care Home Management;Cryotherapy;Electrical Stimulation;Moist Heat;Balance training;Therapeutic activities;Stair training;Gait training;Ultrasound;Neuromuscular re-education;Patient/family education;Therapeutic exercise;Manual techniques;Vasopneumatic Device;Taping;Energy conservation;Dry needling;Passive range of motion;Scar mobilization    PT Next Visit Plan  progress LE strengthening and knee ROM    Consulted and Agree with Plan of Care  Patient       Patient will benefit from skilled therapeutic intervention in order to improve the following deficits and impairments:  Hypomobility, Increased edema, Decreased scar mobility, Decreased activity tolerance, Decreased strength, Pain, Difficulty walking, Decreased balance, Decreased range of motion, Improper body mechanics, Postural dysfunction, Impaired flexibility  Visit Diagnosis: Acute pain of left knee  Stiffness of left knee, not elsewhere classified  Muscle weakness (generalized)  Other abnormalities of gait and mobility     Problem List Patient Active Problem List   Diagnosis Date Noted  . Lung nodules 05/22/2019  . Chronic rhinitis 05/22/2019  . Total knee replacement status, left 04/03/2019  . Degenerative arthritis of left knee 03/30/2019  . Asthma 02/17/2019     Janene Harvey, PT, DPT 05/26/19 11:54 AM   [facility] [address]  [place], [place], [address] Phone: [phone]   Fax:  [phone]  Name: JALIA ZUNIGA MRN:  [id number] Date of Birth: 1951/04/26

## 2019-05-29 ENCOUNTER — Other Ambulatory Visit: Payer: Self-pay

## 2019-05-29 ENCOUNTER — Ambulatory Visit: Payer: Medicare Other

## 2019-05-29 DIAGNOSIS — M25662 Stiffness of left knee, not elsewhere classified: Secondary | ICD-10-CM

## 2019-05-29 DIAGNOSIS — M25562 Pain in left knee: Secondary | ICD-10-CM

## 2019-05-29 DIAGNOSIS — M6281 Muscle weakness (generalized): Secondary | ICD-10-CM

## 2019-05-29 DIAGNOSIS — R2689 Other abnormalities of gait and mobility: Secondary | ICD-10-CM

## 2019-05-29 NOTE — Therapy (Signed)
[facility] [address]  [place], [place], [address] Phone: [phone]   Fax:  [phone]  Physical Therapy Treatment  Patient Details  Name: Carol Gilbert MRN: [id number] Date of Birth: 05/17/1951 Referring Provider (PT): Frederik Pear, MD   Encounter Date: 05/29/2019  PT End of Session - 05/29/19 1022    Visit Number  12    Number of Visits  17    Date for PT Re-Evaluation  06/13/19    Authorization Type  Medicare & AARP    PT Start Time  2010    PT Stop Time  1100    PT Time Calculation (min)  45 min    Activity Tolerance  Patient tolerated treatment well    Behavior During Therapy  [facility] for tasks assessed/performed       Past Medical History:  Diagnosis Date  . Apnea   . Arthritis   . Asthma   . Diabetes mellitus without complication ([place])   . Diverticulitis   . Fibromyalgia   . Hypertension     Past Surgical History:  Procedure Laterality Date  . ABDOMINAL HYSTERECTOMY    . ABDOMINAL SURGERY    . APPENDECTOMY    . BARIATRIC SURGERY N/A 08/10/2018  . BREAST BIOPSY    . CESAREAN SECTION    . CHOLECYSTECTOMY    . HERNIA REPAIR    . SINUS SURGERY WITH INSTATRAK    . TOTAL KNEE ARTHROPLASTY Left 04/03/2019   Procedure: Left Knee Arthroplasty;  Surgeon: Frederik Pear, MD;  Location: [facility];  Service: Orthopedics;  Laterality: Left;  . WISDOM TOOTH EXTRACTION      There were no vitals filed for this visit.  Subjective Assessment - 05/29/19 1032    Subjective  Doing fine today.  Wishes to work on her balance today.    Pertinent History  HTN, fibromyalgia, DM, asthma    Patient Stated Goals  work on walking and standing without pain    Currently in Pain?  No/denies    Pain Score  0-No pain    Multiple Pain Sites  No                       [facility] Adult PT Treatment/Exercise - 05/29/19 0001      Knee/Hip Exercises: Standing   Knee Flexion  Right;Left;10 reps    Knee  Flexion Limitations  2# on airex pad; counter     Hip Flexion  Right;Left;10 reps;Stengthening;Knee bent    Hip Flexion Limitations  on airex pad; counter     Lateral Step Up  Left;10 reps;Hand Hold: 2    Lateral Step Up Limitations  7" lateral step up in stair well     Functional Squat  15 reps    Functional Squat Limitations  counter - cues for increased depth to chair       Knee/Hip Exercises: Supine   Bridges  Both;15 reps    Bridges with Clamshell  10 reps;Strengthening   + isometric hip abd/ER into red TB at knees      Knee/Hip Exercises: Sidelying   Hip ABduction  Right;Left;10 reps    Hip ABduction Limitations  manual cues for alignment   LE resting on blue bolster as starting position    Clams  x 12 reps with red TB on each LE                PT Short Term Goals -  04/25/19 1317      PT SHORT TERM GOAL #1   Title  Patient to be independent with intial HEP.    Time  2    Period  Weeks    Status  Achieved    Target Date  05/02/19        PT Long Term Goals - 05/16/19 1506      PT LONG TERM GOAL #1   Title  Patient to be independent with advanced HEP.    Time  4    Period  Weeks    Status  Partially Met   met for current   Target Date  06/13/19      PT LONG TERM GOAL #2   Title  Patient to demonstrate L knee AROM/PROM 0-120 degrees.    Time  4    Period  Weeks    Status  Partially Met   AROM 0-110 degrees, PROM 0-114 degrees   Target Date  06/13/19      PT LONG TERM GOAL #3   Title  Patient to demonstrate B LE strength >=4+/5.    Time  4    Period  Weeks    Status  Partially Met   improvements in L hip flexion, L knee flexion/extension, B ankle dorsiflexion, L ankle plantarflexion   Target Date  06/13/19      PT LONG TERM GOAL #4   Title  Patient to demonstrate symmetrical weight shift, step length, and knee flexion with ambulation with LRAD.    Time  4    Period  Weeks    Status  Partially Met   still demonstrating mild antalgia and lateral  trunk lean with ambulation without AD   Target Date  06/13/19      PT LONG TERM GOAL #5   Title  Patient to report tolerance of 1 hour of walking/standing without onset of pain.    Time  4    Period  Weeks    Status  Partially Met   tolerates a grocery trip 30-45 min before onset of pain   Target Date  06/13/19      PT LONG TERM GOAL #6   Title  Patient to return to fitness program or activity with modifications as needed in order to continue fitness regimen.    Time  4    Period  Weeks    Status  Partially Met   signed up for a water aerobics class in Oct   Target Date  06/13/19            Plan - 05/29/19 1022    Clinical Impression Statement  Tomara reporting she wishes to work on her balance today in session and still demonstrating functional proximal hip weakness evident with gait and other SLS activities.  Progressed proximal hip/LE strengthening activities today which were performed on complaint surface with Lyndie performing well without increased pain.  Pt. does quickly fatigue and require tactile correction from trunk substitutions with sidelying hip abduction SLR.  Yulonda reports she is performing standing 3-way hip kicker with band resistance and other HEP activities daily.  Does seem to be ambulating with improved gait mechanics and upright posture progressing toward LTG #4.    Personal Factors and Comorbidities  Age;Comorbidity 3+;Time since onset of injury/illness/exacerbation;Fitness;Past/Current Experience    Comorbidities  HTN, fibromyalgia, DM, asthma    Rehab Potential  Good    PT Treatment/Interventions  ADLs/Self Care Home Management;Cryotherapy;Electrical Stimulation;Moist Heat;Balance training;Therapeutic activities;Stair training;Gait training;Ultrasound;Neuromuscular re-education;Patient/family education;Therapeutic  exercise;Manual techniques;Vasopneumatic Device;Taping;Energy conservation;Dry needling;Passive range of motion;Scar mobilization    PT Next Visit Plan   progress LE strengthening and knee ROM    Consulted and Agree with Plan of Care  Patient       Patient will benefit from skilled therapeutic intervention in order to improve the following deficits and impairments:  Hypomobility, Increased edema, Decreased scar mobility, Decreased activity tolerance, Decreased strength, Pain, Difficulty walking, Decreased balance, Decreased range of motion, Improper body mechanics, Postural dysfunction, Impaired flexibility  Visit Diagnosis: Acute pain of left knee  Stiffness of left knee, not elsewhere classified  Muscle weakness (generalized)  Other abnormalities of gait and mobility     Problem List Patient Active Problem List   Diagnosis Date Noted  . Lung nodules 05/22/2019  . Chronic rhinitis 05/22/2019  . Total knee replacement status, left 04/03/2019  . Degenerative arthritis of left knee 03/30/2019  . Asthma 02/17/2019    Bess Harvest, PTA 05/29/19 12:13 PM   [facility] [address]  [place], [place], [address] Phone: [phone]   Fax:  [phone]  Name: CAMMY SANJURJO MRN: [id number] Date of Birth: [birth year]

## 2019-05-31 ENCOUNTER — Other Ambulatory Visit: Payer: Self-pay

## 2019-05-31 ENCOUNTER — Ambulatory Visit (INDEPENDENT_AMBULATORY_CARE_PROVIDER_SITE_OTHER): Payer: Medicare Other | Admitting: Pulmonary Disease

## 2019-05-31 ENCOUNTER — Encounter: Payer: Self-pay | Admitting: Pulmonary Disease

## 2019-05-31 VITALS — BP 136/88 | HR 77 | Temp 98.0°F | Ht 61.0 in | Wt 191.0 lb

## 2019-05-31 DIAGNOSIS — G4733 Obstructive sleep apnea (adult) (pediatric): Secondary | ICD-10-CM

## 2019-05-31 NOTE — Progress Notes (Signed)
Carol Gilbert    [id number]    July 01, 1951  Primary Care Physician:Patient, No Pcp Per  Referring Physician: No referring provider defined for this encounter.  Chief complaint:   Patient with a history of obstructive sleep apnea diagnosed in 2006 Recent bariatric surgery with significant weight loss Intolerance to current pressures She is in for follow-up today Still has significant daytime sleepiness  HPI: Had a repeat study in February showing mild obstructive sleep apnea Continues to have daytime sleepiness She feels she slept a whole lot better with CPAP She currently wakes up many times during the night without CPAP use  Breathing feels stable Continues to use inhalers on a regular basis  She was diagnosed about 2006 and has been on CPAP since then She had bariatric surgery December 11 of 2019 During the process of evaluation and post surgery she has lost over 70 pounds  Pressure felt a little bit too much starting to affect her sleep quality  She was not having any problems with her CPAP until recently Has been very compliant with CPAP use  Usually tries to go to bed about 10 PM Wakes up a couple of times to use the bathroom, without CPAP usually wakes up almost hourly Final awakening time about 10 AM Has been having headaches Dryness of the mouth since has significant weight loss  Comorbidities include asthma, fibromyalgia, hypertension, diabetes, scoliosis, neuropathy  Does not smoke  Outpatient Encounter Medications as of 05/31/2019  Medication Sig  . albuterol (PROVENTIL HFA;VENTOLIN HFA) 108 (90 Base) MCG/ACT inhaler Inhale 2 puffs into the lungs every 6 (six) hours as needed for wheezing or shortness of breath.  . Ascorbic Acid (VITAMIN C) 1000 MG tablet Take 1,000 mg by mouth daily.  Marland Kitchen aspirin EC 81 MG tablet Take 1 tablet (81 mg total) by mouth 2 (two) times daily.  Marland Kitchen b complex vitamins tablet Take 1 tablet by mouth every evening.    . Biotin 5000 MCG TABS Take 15,000 mcg by mouth every evening.  . bisacodyl (DULCOLAX) 5 MG EC tablet Take 5 mg by mouth 2 (two) times daily as needed for moderate constipation (constipation.).  Marland Kitchen Calcium-Phosphorus-Vitamin D (CITRACAL +D3 PO) Take 1 tablet by mouth every evening.  . cetirizine (KLS ALLER-TEC) 10 MG tablet Take 10 mg by mouth daily.  . Cholecalciferol (VITAMIN D3) 50 MCG (2000 UT) TABS Take 2,000 Units by mouth daily.  Mariane Baumgarten Calcium (STOOL SOFTENER PO) Take by mouth as needed.  . docusate sodium (COLACE) 100 MG capsule Take 300 mg by mouth at bedtime.  . DULoxetine (CYMBALTA) 60 MG capsule Take 60 mg by mouth daily.  . fluticasone (FLONASE) 50 MCG/ACT nasal spray Place 2 sprays into both nostrils daily. (Patient taking differently: Place 2 sprays into both nostrils daily as needed for allergies. )  . Methylcellulose, Laxative, (CITRUCEL) 500 MG TABS Take 500 mg by mouth 2 (two) times a day. Morning & Evening  . montelukast (SINGULAIR) 10 MG tablet Take 10 mg by mouth daily.   Marland Kitchen MUCINEX MAXIMUM STRENGTH 1200 MG TB12 Take 1,200 mg by mouth every evening.  . Multiple Vitamin (MULTIVITAMIN WITH MINERALS) TABS tablet Take 1 tablet by mouth daily. One-A-Day Women's  . saccharomyces boulardii (FLORASTOR) 250 MG capsule Take 250 mg by mouth 2 (two) times daily.  . SYMBICORT 160-4.5 MCG/ACT inhaler Inhale 2 puffs into the lungs 2 (two) times daily.   No facility-administered encounter medications on file as of  05/31/2019.     Allergies as of 05/31/2019 - Review Complete 05/31/2019  Allergen Reaction Noted  . Shellfish allergy Anaphylaxis 07/15/2016  . Pollen extract Other (See Comments) 04/03/2019    Past Medical History:  Diagnosis Date  . Apnea   . Arthritis   . Asthma   . Diabetes mellitus without complication ([facility])   . Diverticulitis   . Fibromyalgia   . Hypertension     Past Surgical History:  Procedure Laterality Date  . ABDOMINAL HYSTERECTOMY    . ABDOMINAL  SURGERY    . APPENDECTOMY    . BARIATRIC SURGERY N/A 08/10/2018  . BREAST BIOPSY    . CESAREAN SECTION    . CHOLECYSTECTOMY    . HERNIA REPAIR    . SINUS SURGERY WITH INSTATRAK    . TOTAL KNEE ARTHROPLASTY Left 04/03/2019   Procedure: Left Knee Arthroplasty;  Surgeon: Gean Birchwoodowan, Frank, MD;  Location: [facility];  Service: Orthopedics;  Laterality: Left;  . WISDOM TOOTH EXTRACTION      No family history on file.  Social History   Socioeconomic History  . Marital status: Divorced    Spouse name: Not on file  . Number of children: Not on file  . Years of education: Not on file  . Highest education level: Not on file  Occupational History  . Not on file  Social Needs  . Financial resource strain: Not on file  . Food insecurity    Worry: Not on file    Inability: Not on file  . Transportation needs    Medical: Not on file    Non-medical: Not on file  Tobacco Use  . Smoking status: Never Smoker  . Smokeless tobacco: Never Used  Substance and Sexual Activity  . Alcohol use: No  . Drug use: No  . Sexual activity: Not on file  Lifestyle  . Physical activity    Days per week: Not on file    Minutes per session: Not on file  . Stress: Not on file  Relationships  . Social Musicianconnections    Talks on phone: Not on file    Gets together: Not on file    Attends religious service: Not on file    Active member of club or organization: Not on file    Attends meetings of clubs or organizations: Not on file    Relationship status: Not on file  . Intimate partner violence    Fear of current or ex partner: Not on file    Emotionally abused: Not on file    Physically abused: Not on file    Forced sexual activity: Not on file  Other Topics Concern  . Not on file  Social History Narrative  . Not on file    Review of Systems  Constitutional: Positive for fatigue.  HENT: Negative.   Eyes: Negative.   Respiratory: Positive for apnea. Negative for cough and shortness of breath.    Cardiovascular: Negative.   Gastrointestinal: Negative.   Genitourinary: Negative.   Psychiatric/Behavioral: Positive for sleep disturbance.  All other systems reviewed and are negative.   Vitals:   05/31/19 1356  BP: 136/88  Pulse: 77  Temp: 98 F (36.7 C)  SpO2: 97%     Physical Exam  Constitutional: She appears well-developed and well-nourished.  obese  HENT:  Head: Normocephalic and atraumatic.  Eyes: Pupils are equal, round, and reactive to light. Right eye exhibits no discharge. Left eye exhibits no discharge.  Neck: Neck supple. No JVD present.  No tracheal deviation present. No thyromegaly present.  Cardiovascular: Normal rate and regular rhythm.  Pulmonary/Chest: Effort normal and breath sounds normal. No stridor. No respiratory distress. She has no wheezes. She has no rales.  Abdominal: Soft. Bowel sounds are normal. She exhibits no distension. There is no abdominal tenderness.    Results of the Epworth flowsheet 10/06/2018  Sitting and reading 0  Watching TV 0  Sitting, inactive in a public place (e.g. a theatre or a meeting) 0  As a passenger in a car for an hour without a break 1  Lying down to rest in the afternoon when circumstances permit 3  Sitting and talking to someone 0  Sitting quietly after a lunch without alcohol 0  In a car, while stopped for a few minutes in traffic 0  Total score 4   Assessment:  .  Obstructive sleep apnea -Diagnosed in 2006, has been very compliant with treatment -Recent intolerance related to continuing weight loss -Sleep is getting disrupted by dryness and headaches -Repeat sleep study did reveal mild obstructive sleep apnea -She is still having significant daytime symptoms of sleepiness  .  Morbid obesity -Has managed to lose 70 pounds and is continuing to lose weight -She continues to lose weight  .  CPAP intolerance -Related to continuing weight loss    She felt better when she was using CPAP on a regular basis  Better sleep quality Better daytime activities and energy levels  Plan/Recommendations: .  Reinitiate CPAP therapy, auto titrating CPAP 5-15  .  Pressure intolerance is related to continue weight loss  .  I will see her back in the office in about 3 months  Encouraged to continue working on weight loss efforts  Virl Diamond MD [facility] 05/31/2019, 2:01 PM  CC: No ref. provider found

## 2019-05-31 NOTE — Patient Instructions (Signed)
Daytime sleepiness despite weight loss Recent sleep study showing mild obstructive sleep apnea  Initiate CPAP therapy Pressures 5-15 We will contact DME company for CPAP  Will see you back in the office in 3 months

## 2019-06-01 ENCOUNTER — Encounter: Payer: Self-pay | Admitting: Physical Therapy

## 2019-06-01 ENCOUNTER — Ambulatory Visit: Payer: Medicare Other | Attending: Orthopedic Surgery | Admitting: Physical Therapy

## 2019-06-01 DIAGNOSIS — M25662 Stiffness of left knee, not elsewhere classified: Secondary | ICD-10-CM | POA: Diagnosis present

## 2019-06-01 DIAGNOSIS — R2689 Other abnormalities of gait and mobility: Secondary | ICD-10-CM | POA: Diagnosis present

## 2019-06-01 DIAGNOSIS — M25562 Pain in left knee: Secondary | ICD-10-CM

## 2019-06-01 DIAGNOSIS — M6281 Muscle weakness (generalized): Secondary | ICD-10-CM | POA: Insufficient documentation

## 2019-06-01 NOTE — Therapy (Signed)
[facility] [address]  [place], [place], [address] Phone: [phone]   Fax:  [phone]  Physical Therapy Treatment  Patient Details  Name: Carol Gilbert MRN: [id number] Date of Birth: [birth year] Referring Provider (PT): Frederik Pear, MD   Encounter Date: 06/01/2019  PT End of Session - 06/01/19 1453    Visit Number  13    Number of Visits  17    Date for PT Re-Evaluation  06/13/19    Authorization Type  Medicare & AARP    PT Start Time  3532    PT Stop Time  1445    PT Time Calculation (min)  43 min    Activity Tolerance  Patient tolerated treatment well;Patient limited by pain    Behavior During Therapy  [facility] for tasks assessed/performed       Past Medical History:  Diagnosis Date  . Apnea   . Arthritis   . Asthma   . Diabetes mellitus without complication ([place])   . Diverticulitis   . Fibromyalgia   . Hypertension     Past Surgical History:  Procedure Laterality Date  . ABDOMINAL HYSTERECTOMY    . ABDOMINAL SURGERY    . APPENDECTOMY    . BARIATRIC SURGERY N/A 08/10/2018  . BREAST BIOPSY    . CESAREAN SECTION    . CHOLECYSTECTOMY    . HERNIA REPAIR    . SINUS SURGERY WITH INSTATRAK    . TOTAL KNEE ARTHROPLASTY Left 04/03/2019   Procedure: Left Knee Arthroplasty;  Surgeon: Frederik Pear, MD;  Location: [facility];  Service: Orthopedics;  Laterality: Left;  . WISDOM TOOTH EXTRACTION      There were no vitals filed for this visit.  Subjective Assessment - 06/01/19 1404    Subjective  Everything is okay. Was told by MD to use a CPAP machine- believes this will help with her energy levels.    Pertinent History  HTN, fibromyalgia, DM, asthma    Patient Stated Goals  work on walking and standing without pain    Currently in Pain?  Yes    Pain Score  2     Pain Location  Knee    Pain Orientation  Left    Pain Descriptors / Indicators  Aching    Pain Type  Acute pain;Surgical pain          [facility] PT Assessment - 06/01/19 0001      AROM   Left Knee Flexion  111   after manual therapy and stretching                  [facility] Adult PT Treatment/Exercise - 06/01/19 0001      Knee/Hip Exercises: Stretches   Hip Flexor Stretch  Left;2 reps;30 seconds    Hip Flexor Stretch Limitations  mod thomas stretch + strap       Knee/Hip Exercises: Aerobic   Recumbent Bike  L1 x 6 min (full revolutions)      Knee/Hip Exercises: Standing   Forward Step Up  Left;1 set;Step Height: 8";Hand Hold: 1;15 reps;Step Height: 6"    Forward Step Up Limitations  5x 8" 10x 6" with heavy UE support on TM rail and CGA for safety    SLS  SLS ring toss game on L LE 2x   intermittent finger support on TM rail required   Other Standing Knee Exercises  L knee flexion stretch at TM 5x10" to tolerance  Knee/Hip Exercises: Supine   Heel Slides  Left;10 reps;AAROM    Heel Slides Limitations  10x3" strap and orange pball      Manual Therapy   Manual Therapy  Joint mobilization    Manual therapy comments  supine     Joint Mobilization  L patellar mobilizations grade III/IV in all directions to tolerance with 1/2 bolster under knee; L knee proximal tibia mobs for knee flexion grade III/IV to tolerance   more hypomobility in superior/inferior directions            PT Education - 06/01/19 1453    Education Details  update to HEP    Person(s) Educated  Patient    Methods  Explanation;Demonstration;Tactile cues;Verbal cues;Handout    Comprehension  Verbalized understanding;Returned demonstration       PT Short Term Goals - 04/25/19 1317      PT SHORT TERM GOAL #1   Title  Patient to be independent with intial HEP.    Time  2    Period  Weeks    Status  Achieved    Target Date  05/02/19        PT Long Term Goals - 05/16/19 1506      PT LONG TERM GOAL #1   Title  Patient to be independent with advanced HEP.    Time  4    Period  Weeks    Status  Partially Met   met  for current   Target Date  06/13/19      PT LONG TERM GOAL #2   Title  Patient to demonstrate L knee AROM/PROM 0-120 degrees.    Time  4    Period  Weeks    Status  Partially Met   AROM 0-110 degrees, PROM 0-114 degrees   Target Date  06/13/19      PT LONG TERM GOAL #3   Title  Patient to demonstrate B LE strength >=4+/5.    Time  4    Period  Weeks    Status  Partially Met   improvements in L hip flexion, L knee flexion/extension, B ankle dorsiflexion, L ankle plantarflexion   Target Date  06/13/19      PT LONG TERM GOAL #4   Title  Patient to demonstrate symmetrical weight shift, step length, and knee flexion with ambulation with LRAD.    Time  4    Period  Weeks    Status  Partially Met   still demonstrating mild antalgia and lateral trunk lean with ambulation without AD   Target Date  06/13/19      PT LONG TERM GOAL #5   Title  Patient to report tolerance of 1 hour of walking/standing without onset of pain.    Time  4    Period  Weeks    Status  Partially Met   tolerates a grocery trip 30-45 min before onset of pain   Target Date  06/13/19      PT LONG TERM GOAL #6   Title  Patient to return to fitness program or activity with modifications as needed in order to continue fitness regimen.    Time  4    Period  Weeks    Status  Partially Met   signed up for a water aerobics class in Oct   Target Date  06/13/19            Plan - 06/01/19 1453    Clinical Impression Statement  Patient without complaints  at start of session. Tolerated L patellar and knee flexion mobilizations to improve ROM. Patellar mobility most restricted in superior and infusion motions, thus educated patient on performing this activity at home for improved mobility. Patient reported understanding. L knee AROM reached 111 degrees after stretching and manual therapy. Challenged single leg balance with patient requiring atleast 1 finger support to steady herself on L LE. Worked on step ups with  patient requiring heavy UE support and anterior trunk lean to complete this exercise, thus changed to shorter step with improved performance. No complaints at end of session. Patient progressing well towards goals.    Personal Factors and Comorbidities  --    Comorbidities  HTN, fibromyalgia, DM, asthma    Rehab Potential  Good    PT Treatment/Interventions  ADLs/Self Care Home Management;Cryotherapy;Electrical Stimulation;Moist Heat;Balance training;Therapeutic activities;Stair training;Gait training;Ultrasound;Neuromuscular re-education;Patient/family education;Therapeutic exercise;Manual techniques;Vasopneumatic Device;Taping;Energy conservation;Dry needling;Passive range of motion;Scar mobilization    PT Next Visit Plan  progress LE strengthening and knee ROM    Consulted and Agree with Plan of Care  Patient       Patient will benefit from skilled therapeutic intervention in order to improve the following deficits and impairments:  Hypomobility, Increased edema, Decreased scar mobility, Decreased activity tolerance, Decreased strength, Pain, Difficulty walking, Decreased balance, Decreased range of motion, Improper body mechanics, Postural dysfunction, Impaired flexibility  Visit Diagnosis: Acute pain of left knee  Stiffness of left knee, not elsewhere classified  Muscle weakness (generalized)  Other abnormalities of gait and mobility     Problem List Patient Active Problem List   Diagnosis Date Noted  . Lung nodules 05/22/2019  . Chronic rhinitis 05/22/2019  . Total knee replacement status, left 04/03/2019  . Degenerative arthritis of left knee 03/30/2019  . Asthma 02/17/2019     Janene Harvey, PT, DPT 06/01/19 3:01 PM   [facility] [address]  [place], [place], [address] Phone: [phone]   Fax:  [phone]  Name: TRUTH WOLAVER MRN: [id number] Date of Birth: 05-15-1951

## 2019-06-06 ENCOUNTER — Other Ambulatory Visit: Payer: Self-pay

## 2019-06-06 ENCOUNTER — Ambulatory Visit: Payer: Medicare Other | Admitting: Physical Therapy

## 2019-06-06 ENCOUNTER — Encounter: Payer: Self-pay | Admitting: Physical Therapy

## 2019-06-06 DIAGNOSIS — M25662 Stiffness of left knee, not elsewhere classified: Secondary | ICD-10-CM

## 2019-06-06 DIAGNOSIS — R2689 Other abnormalities of gait and mobility: Secondary | ICD-10-CM

## 2019-06-06 DIAGNOSIS — M6281 Muscle weakness (generalized): Secondary | ICD-10-CM

## 2019-06-06 DIAGNOSIS — M25562 Pain in left knee: Secondary | ICD-10-CM

## 2019-06-06 NOTE — Therapy (Signed)
[facility] [address]  [place], [place], [address] Phone: [phone]   Fax:  [phone]  Physical Therapy Treatment  Patient Details  Name: Carol Gilbert MRN: [id number] Date of Birth: 28-Feb-1951 Referring Provider (PT): Frederik Pear, MD   Encounter Date: 06/06/2019  PT End of Session - 06/06/19 1034    Visit Number  14    Number of Visits  17    Date for PT Re-Evaluation  06/13/19    Authorization Type  Medicare & AARP    PT Start Time  1017    PT Stop Time  1106    PT Time Calculation (min)  49 min    Activity Tolerance  Patient tolerated treatment well;Patient limited by pain    Behavior During Therapy  [facility] for tasks assessed/performed       Past Medical History:  Diagnosis Date  . Apnea   . Arthritis   . Asthma   . Diabetes mellitus without complication ([place])   . Diverticulitis   . Fibromyalgia   . Hypertension     Past Surgical History:  Procedure Laterality Date  . ABDOMINAL HYSTERECTOMY    . ABDOMINAL SURGERY    . APPENDECTOMY    . BARIATRIC SURGERY N/A 08/10/2018  . BREAST BIOPSY    . CESAREAN SECTION    . CHOLECYSTECTOMY    . HERNIA REPAIR    . SINUS SURGERY WITH INSTATRAK    . TOTAL KNEE ARTHROPLASTY Left 04/03/2019   Procedure: Left Knee Arthroplasty;  Surgeon: Frederik Pear, MD;  Location: [facility];  Service: Orthopedics;  Laterality: Left;  . WISDOM TOOTH EXTRACTION      There were no vitals filed for this visit.  Subjective Assessment - 06/06/19 1025    Subjective  Pt arriving to therapy today reporting 1/10 pain in her left knee. Pt reporting she talked to MD about ordering a CPAP machine, but pt has not received it yet.    Pertinent History  HTN, fibromyalgia, DM, asthma    Limitations  Sitting;Lifting;Standing;Walking;House hold activities    How long can you sit comfortably?  unlimited    How long can you stand comfortably?  15 min    How long can you walk  comfortably?  20 min    Patient Stated Goals  work on walking and standing without pain    Currently in Pain?  Yes    Pain Score  1     Pain Location  Knee    Pain Orientation  Left    Pain Descriptors / Indicators  Aching    Pain Frequency  Intermittent                       [facility] Adult PT Treatment/Exercise - 06/06/19 0001      Lumbar Exercises: Standing   Other Standing Lumbar Exercises  sit to stand x 15 from mat table      Knee/Hip Exercises: Aerobic   Recumbent Bike  L2 x 6 min (full revolutions)      Knee/Hip Exercises: Standing   Lateral Step Up  Both;15 reps;Hand Hold: 1;Step Height: 6"    Forward Step Up  Left;1 set;15 reps;Hand Hold: 1;Step Height: 6"    Forward Step Up Limitations  instructions not to lean forward and maintain upright posture    SLS  SLS on blue theraband disc x 5 reps intermittent support, pt was only able to stand 2 seonds without  support      Knee/Hip Exercises: Supine   Bridges  Strengthening;Both;15 reps;Limitations    Bridges Limitations  bridges x 10 feet on ball holding 3 seconds each      Manual Therapy   Manual Therapy  Joint mobilization;Soft tissue mobilization;Passive ROM    Joint Mobilization  L patellar mobilizations grade III/IV in all directions to tolerance with 1/2 bolster under knee; L knee proximal tibia mobs for knee flexion grade III/IV to tolerance   more hypomobility in superior/inferior directions   Soft tissue mobilization  It band rolling     Passive ROM  knee flexion               PT Short Term Goals - 04/25/19 1317      PT SHORT TERM GOAL #1   Title  Patient to be independent with intial HEP.    Time  2    Period  Weeks    Status  Achieved    Target Date  05/02/19        PT Long Term Goals - 06/06/19 1047      PT LONG TERM GOAL #1   Title  Patient to be independent with advanced HEP.    Time  4    Period  Weeks    Status  Partially Met      PT LONG TERM GOAL #2   Title  Patient  to demonstrate L knee AROM/PROM 0-120 degrees.    Time  4    Period  Weeks    Status  Partially Met      PT LONG TERM GOAL #3   Title  Patient to demonstrate B LE strength >=4+/5.    Time  4    Period  Weeks    Status  Partially Met      PT LONG TERM GOAL #4   Title  Patient to demonstrate symmetrical weight shift, step length, and knee flexion with ambulation with LRAD.    Period  Weeks    Status  Partially Met      PT LONG TERM GOAL #5   Title  Patient to report tolerance of 1 hour of walking/standing without onset of pain.    Time  4    Period  Weeks    Status  Partially Met            Plan - 06/06/19 1034    Clinical Impression Statement  Pt tolerating exercises well. Pt reporting 1/10 pain at beginning of session. Pt reporting she has been working on her HEP. Continued to work on step ups using 6 inch step with bilateral hand held assistance. No goals met this session. Continue with skilled PT.    Personal Factors and Comorbidities  Age;Comorbidity 3+;Time since onset of injury/illness/exacerbation;Fitness;Past/Current Experience    Comorbidities  HTN, fibromyalgia, DM, asthma    Examination-Activity Limitations  Bathing;Sit;Bend;Squat;Caring for Others;Stairs;Carry;Stand;Toileting;Transfers;Lift;Locomotion Level    Rehab Potential  Good    PT Frequency  2x / week    PT Duration  4 weeks    PT Treatment/Interventions  ADLs/Self Care Home Management;Cryotherapy;Electrical Stimulation;Moist Heat;Balance training;Therapeutic activities;Stair training;Gait training;Ultrasound;Neuromuscular re-education;Patient/family education;Therapeutic exercise;Manual techniques;Vasopneumatic Device;Taping;Energy conservation;Dry needling;Passive range of motion;Scar mobilization    PT Next Visit Plan  progress LE strengthening and knee ROM    Consulted and Agree with Plan of Care  Patient       Patient will benefit from skilled therapeutic intervention in order to improve the following  deficits and impairments:  Visit Diagnosis: Acute pain of left knee  Stiffness of left knee, not elsewhere classified  Muscle weakness (generalized)  Other abnormalities of gait and mobility     Problem List Patient Active Problem List   Diagnosis Date Noted  . Lung nodules 05/22/2019  . Chronic rhinitis 05/22/2019  . Total knee replacement status, left 04/03/2019  . Degenerative arthritis of left knee 03/30/2019  . Asthma 02/17/2019    Oretha Caprice, PT 06/06/2019, 11:09 AM  [facility] [address]  [place], [place], [address] Phone: [phone]   Fax:  [phone]  Name: Carol Gilbert MRN: [id number] Date of Birth: [birth year]

## 2019-06-08 ENCOUNTER — Ambulatory Visit: Payer: Medicare Other

## 2019-06-08 ENCOUNTER — Other Ambulatory Visit: Payer: Self-pay

## 2019-06-08 DIAGNOSIS — M6281 Muscle weakness (generalized): Secondary | ICD-10-CM

## 2019-06-08 DIAGNOSIS — M25662 Stiffness of left knee, not elsewhere classified: Secondary | ICD-10-CM

## 2019-06-08 DIAGNOSIS — R2689 Other abnormalities of gait and mobility: Secondary | ICD-10-CM

## 2019-06-08 DIAGNOSIS — M25562 Pain in left knee: Secondary | ICD-10-CM

## 2019-06-08 NOTE — Therapy (Signed)
[facility] [address]  [place], [place], [address] Phone: [phone]   Fax:  [phone]  Physical Therapy Treatment  Patient Details  Name: Carol Gilbert MRN: [id number] Date of Birth: 12-11-1950 Referring Provider (PT): Frederik Pear, MD   Encounter Date: 06/08/2019  PT End of Session - 06/08/19 1020    Visit Number  15    Number of Visits  17    Date for PT Re-Evaluation  06/13/19    Authorization Type  Medicare & AARP    PT Start Time  1017    PT Stop Time  1101    PT Time Calculation (min)  44 min    Activity Tolerance  Patient tolerated treatment well;Patient limited by pain    Behavior During Therapy  [facility] for tasks assessed/performed       Past Medical History:  Diagnosis Date  . Apnea   . Arthritis   . Asthma   . Diabetes mellitus without complication ([place])   . Diverticulitis   . Fibromyalgia   . Hypertension     Past Surgical History:  Procedure Laterality Date  . ABDOMINAL HYSTERECTOMY    . ABDOMINAL SURGERY    . APPENDECTOMY    . BARIATRIC SURGERY N/A 08/10/2018  . BREAST BIOPSY    . CESAREAN SECTION    . CHOLECYSTECTOMY    . HERNIA REPAIR    . SINUS SURGERY WITH INSTATRAK    . TOTAL KNEE ARTHROPLASTY Left 04/03/2019   Procedure: Left Knee Arthroplasty;  Surgeon: Frederik Pear, MD;  Location: [facility];  Service: Orthopedics;  Laterality: Left;  . WISDOM TOOTH EXTRACTION      There were no vitals filed for this visit.  Subjective Assessment - 06/08/19 1019    Subjective  Pt. reporting she is reporting she is doing water exercise 3x/week    Pertinent History  HTN, fibromyalgia, DM, asthma    Patient Stated Goals  work on walking and standing without pain    Currently in Pain?  Yes    Pain Score  2     Pain Location  Knee    Pain Orientation  Left    Pain Descriptors / Indicators  Aching    Pain Type  Acute pain;Surgical pain    Multiple Pain Sites  No         [facility] PT  Assessment - 06/08/19 0001      AROM   AROM Assessment Site  Knee    Right/Left Knee  Left    Left Knee Extension  0    Left Knee Flexion  115      PROM   PROM Assessment Site  Knee    Right/Left Knee  Left    Left Knee Extension  0    Left Knee Flexion  120                   [facility] Adult PT Treatment/Exercise - 06/08/19 0001      Knee/Hip Exercises: Aerobic   Recumbent Bike  L2 x 6 min (full revolutions)      Knee/Hip Exercises: Machines for Strengthening   Cybex Knee Extension  B LEs: 10# x 15 reps     Cybex Knee Flexion  B LE's: 15# x 15 rpes     Cybex Leg Press  B LEs 25# x 15 reps       Knee/Hip Exercises: Standing   Heel Raises  Both;15 reps;Left  Heel Raises Limitations  B con/75% wt. shift over L on eccentric     Forward Step Up  Left;10 reps;Hand Hold: 1    Forward Step Up Limitations  7" step       Knee/Hip Exercises: Supine   Straight Leg Raises  Left;15 reps;Strengthening             PT Education - 06/08/19 1216    Education Details  HEP update: quad extension, HS curl machine, leg press machine strengthening to be performed at gym as able    Person(s) Educated  Patient    Methods  Explanation;Demonstration;Verbal cues;Handout    Comprehension  Verbalized understanding;Returned demonstration;Verbal cues required       PT Short Term Goals - 04/25/19 1317      PT SHORT TERM GOAL #1   Title  Patient to be independent with intial HEP.    Time  2    Period  Weeks    Status  Achieved    Target Date  05/02/19        PT Long Term Goals - 06/08/19 1055      PT LONG TERM GOAL #1   Title  Patient to be independent with advanced HEP.    Time  4    Period  Weeks    Status  Partially Met      PT LONG TERM GOAL #2   Title  Patient to demonstrate L knee AROM/PROM 0-120 degrees.    Time  4    Period  Weeks    Status  Partially Met   06/08/19: pt. able to demo L knee AROM 0-115 dg, PROM 0-120 dg     PT LONG TERM GOAL #3   Title   Patient to demonstrate B LE strength >=4+/5.    Time  4    Period  Weeks    Status  Partially Met      PT LONG TERM GOAL #4   Title  Patient to demonstrate symmetrical weight shift, step length, and knee flexion with ambulation with LRAD.    Period  Weeks    Status  Partially Met      PT LONG TERM GOAL #5   Title  Patient to report tolerance of 1 hour of walking/standing without onset of pain.    Time  4    Period  Weeks    Status  Partially Met            Plan - 06/08/19 1218    Clinical Impression Statement  Pt. doing well today.  Pt. was made aware that she is nearing end of POC with pt. verbalizing understanding.  Discussed machine strengthening activities for B LE that would be beneficial at local gym per pt. request today and issued handout as pt. attending water aerobics at local gym.  Ended visit with pt. demonstrating much improved quad control and endurance with SLR x 15.  Progressing with L knee ROM, AROM 0-115 dg, PROM 0-120 dg.  Progressing toward LTG 2#.    Personal Factors and Comorbidities  Age;Comorbidity 3+;Time since onset of injury/illness/exacerbation;Fitness;Past/Current Experience    Comorbidities  HTN, fibromyalgia, DM, asthma    Rehab Potential  Good    PT Treatment/Interventions  ADLs/Self Care Home Management;Cryotherapy;Electrical Stimulation;Moist Heat;Balance training;Therapeutic activities;Stair training;Gait training;Ultrasound;Neuromuscular re-education;Patient/family education;Therapeutic exercise;Manual techniques;Vasopneumatic Device;Taping;Energy conservation;Dry needling;Passive range of motion;Scar mobilization    PT Next Visit Plan  progress LE strengthening and knee ROM    Consulted and Agree with Plan of Care  Patient       Patient will benefit from skilled therapeutic intervention in order to improve the following deficits and impairments:  Hypomobility, Increased edema, Decreased scar mobility, Decreased activity tolerance, Decreased  strength, Pain, Difficulty walking, Decreased balance, Decreased range of motion, Improper body mechanics, Postural dysfunction, Impaired flexibility  Visit Diagnosis: Acute pain of left knee  Stiffness of left knee, not elsewhere classified  Muscle weakness (generalized)  Other abnormalities of gait and mobility     Problem List Patient Active Problem List   Diagnosis Date Noted  . Lung nodules 05/22/2019  . Chronic rhinitis 05/22/2019  . Total knee replacement status, left 04/03/2019  . Degenerative arthritis of left knee 03/30/2019  . Asthma 02/17/2019    Bess Harvest, PTA 06/08/19 12:26 PM   [facility] [address]  [place], [place], [address] Phone: [phone]   Fax:  [phone]  Name: Carol Gilbert MRN: [id number] Date of Birth: Mar 17, 1951

## 2019-06-13 ENCOUNTER — Ambulatory Visit: Payer: Medicare Other | Admitting: Physical Therapy

## 2019-06-13 ENCOUNTER — Encounter: Payer: Self-pay | Admitting: Physical Therapy

## 2019-06-13 ENCOUNTER — Other Ambulatory Visit: Payer: Self-pay

## 2019-06-13 DIAGNOSIS — R2689 Other abnormalities of gait and mobility: Secondary | ICD-10-CM

## 2019-06-13 DIAGNOSIS — M25662 Stiffness of left knee, not elsewhere classified: Secondary | ICD-10-CM

## 2019-06-13 DIAGNOSIS — M6281 Muscle weakness (generalized): Secondary | ICD-10-CM

## 2019-06-13 DIAGNOSIS — M25562 Pain in left knee: Secondary | ICD-10-CM

## 2019-06-13 NOTE — Therapy (Signed)
[facility] [address]  [place], [place], [address] Phone: [phone]   Fax:  [phone]  Physical Therapy Discharge Summary  Patient Details  Name: Carol Gilbert MRN: [id number] Date of Birth: 1951/02/27 Referring Provider (PT): Frederik Pear, MD  Progress Note Reporting Period 05/18/19 to 06/13/19  See note below for Objective Data and Assessment of Progress/Goals.     Encounter Date: 06/13/2019  PT End of Session - 06/13/19 1204    Visit Number  16    Number of Visits  17    Date for PT Re-Evaluation  06/13/19    Authorization Type  Medicare & AARP    PT Start Time  0947    PT Stop Time  1059    PT Time Calculation (min)  44 min    Activity Tolerance  Patient tolerated treatment well    Behavior During Therapy  [facility] for tasks assessed/performed       Past Medical History:  Diagnosis Date  . Apnea   . Arthritis   . Asthma   . Diabetes mellitus without complication ([place])   . Diverticulitis   . Fibromyalgia   . Hypertension     Past Surgical History:  Procedure Laterality Date  . ABDOMINAL HYSTERECTOMY    . ABDOMINAL SURGERY    . APPENDECTOMY    . BARIATRIC SURGERY N/A 08/10/2018  . BREAST BIOPSY    . CESAREAN SECTION    . CHOLECYSTECTOMY    . HERNIA REPAIR    . SINUS SURGERY WITH INSTATRAK    . TOTAL KNEE ARTHROPLASTY Left 04/03/2019   Procedure: Left Knee Arthroplasty;  Surgeon: Frederik Pear, MD;  Location: [facility];  Service: Orthopedics;  Laterality: Left;  . WISDOM TOOTH EXTRACTION      There were no vitals filed for this visit.  Subjective Assessment - 06/13/19 1019    Subjective  Has been doing okay. Reports 98% improvment in L knee. The only thing remaining is getting up from sitting without pain and to work on balance.    Pertinent History  HTN, fibromyalgia, DM, asthma    Patient Stated Goals  work on walking and standing without pain    Currently in Pain?  No/denies         [facility] PT Assessment - 06/13/19 0001      Observation/Other Assessments   Focus on Therapeutic Outcomes (FOTO)   Knee: 69 (31% limited, 24% predicted)      AROM   Left Knee Extension  0    Left Knee Flexion  119      PROM   Left Knee Extension  0    Left Knee Flexion  120      Strength   Right Hip Flexion  4+/5    Right Hip ABduction  4+/5    Right Hip ADduction  4+/5    Left Hip Flexion  4+/5    Left Hip ABduction  4+/5    Left Hip ADduction  4/5    Right Knee Flexion  4/5    Right Knee Extension  5/5    Left Knee Flexion  4+/5    Left Knee Extension  5/5    Right Ankle Dorsiflexion  5/5    Right Ankle Plantar Flexion  4+/5    Left Ankle Dorsiflexion  5/5    Left Ankle Plantar Flexion  4+/5  [facility] Adult PT Treatment/Exercise - 06/13/19 0001      Knee/Hip Exercises: Stretches   Hip Flexor Stretch  Left;2 reps;30 seconds    Hip Flexor Stretch Limitations  mod thomas stretch + strap       Knee/Hip Exercises: Aerobic   Recumbent Bike  L2 x 6 min (full revolutions)      Knee/Hip Exercises: Standing   Knee Flexion  Strengthening;Left;1 set;20 reps    Knee Flexion Limitations  2#; 2 ski poles      Knee/Hip Exercises: Supine   Heel Slides  Left;10 reps;AAROM    Heel Slides Limitations  10x5" strap and orange pball    Bridges  Strengthening;Both;Limitations;10 reps    Bridges Limitations  HS bridge   cues for form     Manual Therapy   Manual Therapy  Joint mobilization    Joint Mobilization  L patellar mobilizations grade III/IV in all directions to tolerance    TTP in L medial knee with palpable soft tissue restriction            PT Education - 06/13/19 1204    Education Details  update to HEP; discussion on objective progress with PT    Person(s) Educated  Patient    Methods  Explanation;Demonstration;Tactile cues;Verbal cues;Handout    Comprehension  Verbalized understanding;Returned demonstration       PT Short  Term Goals - 06/13/19 1023      PT SHORT TERM GOAL #1   Title  Patient to be independent with intial HEP.    Time  2    Period  Weeks    Status  Achieved    Target Date  05/02/19        PT Long Term Goals - 06/13/19 1023      PT LONG TERM GOAL #1   Title  Patient to be independent with advanced HEP.    Time  4    Period  Weeks    Status  Achieved      PT LONG TERM GOAL #2   Title  Patient to demonstrate L knee AROM/PROM 0-120 degrees.    Time  4    Period  Weeks    Status  Achieved      PT LONG TERM GOAL #3   Title  Patient to demonstrate B LE strength >=4+/5.    Time  4    Period  Weeks    Status  Partially Met   improvements in B hip flexion, B hip abduction, R hip adduction strength; most limited in HS and adductor strength     PT LONG TERM GOAL #4   Title  Patient to demonstrate symmetrical weight shift, step length, and knee flexion with ambulation with LRAD.    Period  Weeks    Status  Achieved   good improvement in all gait deviations without AD, with mild lateral weight shift remaining     PT LONG TERM GOAL #5   Title  Patient to report tolerance of 1 hour of walking/standing without onset of pain.    Time  4    Period  Weeks    Status  Achieved            Plan - 06/13/19 1207    Clinical Impression Statement  Patient arrived to session with report of 98% improvement in L knee since surgery. Feels that her only remaining limitations are pain with STS and imbalance. Patient has met L knee ROM goal, with PROM 0-120 degrees  today. Has demonstrated improvements in B hip flexion, B hip abduction, R hip adduction strength; most limited in HS and adductor strength. Now showing good improvement in all gait deviations without AD, with mild lateral weight shift remaining- likely apparent at baseline. Notes that she is now able to walk for 1 hour without increased pain. Has returned to aquatic therapy and reports that she has purchased items in order to continue  performing ther-ex activities that were performed here in the clinic.  Updated HEP with exercises to address adductor and HS strength- patient reported understanding. Patient has met or partially met all goals at this time and is independent with HEP. Discharging patient at this time.    Personal Factors and Comorbidities  Age;Comorbidity 3+;Time since onset of injury/illness/exacerbation;Fitness;Past/Current Experience    Comorbidities  HTN, fibromyalgia, DM, asthma    Rehab Potential  Good    PT Treatment/Interventions  ADLs/Self Care Home Management;Cryotherapy;Electrical Stimulation;Moist Heat;Balance training;Therapeutic activities;Stair training;Gait training;Ultrasound;Neuromuscular re-education;Patient/family education;Therapeutic exercise;Manual techniques;Vasopneumatic Device;Taping;Energy conservation;Dry needling;Passive range of motion;Scar mobilization    PT Next Visit Plan  DC at this time    Consulted and Agree with Plan of Care  Patient       Patient will benefit from skilled therapeutic intervention in order to improve the following deficits and impairments:  Hypomobility, Increased edema, Decreased scar mobility, Decreased activity tolerance, Decreased strength, Pain, Difficulty walking, Decreased balance, Decreased range of motion, Improper body mechanics, Postural dysfunction, Impaired flexibility  Visit Diagnosis: Acute pain of left knee  Stiffness of left knee, not elsewhere classified  Muscle weakness (generalized)  Other abnormalities of gait and mobility     Problem List Patient Active Problem List   Diagnosis Date Noted  . Lung nodules 05/22/2019  . Chronic rhinitis 05/22/2019  . Total knee replacement status, left 04/03/2019  . Degenerative arthritis of left knee 03/30/2019  . Asthma 02/17/2019     PHYSICAL THERAPY DISCHARGE SUMMARY  Visits from Start of Care: 16  Current functional level related to goals / functional outcomes: See above clinical  impression   Remaining deficits: Decreased LE strength   Education / Equipment: HEP  Plan: Patient agrees to discharge.  Patient goals were partially met. Patient is being discharged due to meeting the stated rehab goals.  ?????     Janene Harvey, PT, DPT 06/13/19 12:14 PM    [facility] [address]  [place], [place], [address] Phone: [phone]   Fax:  [phone]  Name: ROMEKA SCIFRES MRN: [id number] Date of Birth: 21-Sep-1950

## 2019-06-15 ENCOUNTER — Encounter: Payer: Medicare Other | Admitting: Physical Therapy

## 2019-06-22 ENCOUNTER — Encounter: Payer: Medicare Other | Admitting: Physical Therapy

## 2019-07-10 ENCOUNTER — Ambulatory Visit: Payer: Medicare Other | Admitting: Pulmonary Disease

## 2019-07-25 ENCOUNTER — Encounter: Payer: Self-pay | Admitting: Pulmonary Disease

## 2019-07-25 ENCOUNTER — Other Ambulatory Visit: Payer: Self-pay

## 2019-07-25 ENCOUNTER — Ambulatory Visit (INDEPENDENT_AMBULATORY_CARE_PROVIDER_SITE_OTHER): Payer: Medicare Other | Admitting: Pulmonary Disease

## 2019-07-25 VITALS — BP 126/80 | HR 78 | Temp 97.9°F | Ht 61.0 in | Wt 190.6 lb

## 2019-07-25 DIAGNOSIS — Z9989 Dependence on other enabling machines and devices: Secondary | ICD-10-CM | POA: Diagnosis not present

## 2019-07-25 DIAGNOSIS — G4733 Obstructive sleep apnea (adult) (pediatric): Secondary | ICD-10-CM | POA: Diagnosis not present

## 2019-07-25 NOTE — Patient Instructions (Signed)
Your machine compliance shows excellent compliance with good control of the sleep apnea  No changes to be made, you should have better tolerance with your mask change  I will see you back in the office in about 6 months  Call with significant concerns

## 2019-07-25 NOTE — Progress Notes (Signed)
Carol Gilbert    [id number]    July 28, 1951  Primary Care Physician:Patient, No Pcp Per  Referring Physician: No referring provider defined for this encounter.  Chief complaint:   Patient with a history of obstructive sleep apnea diagnosed in 2006 Recent bariatric surgery with significant weight loss Has been doing well with an auto titrating machine  HPI: Had a repeat study in February showing mild obstructive sleep apnea Recently set up with an auto titrating machine She feels much better  Tolerating treatment well  Breathing feels stable Continues to use inhalers on a regular basis  She was diagnosed about 2006 and has been on CPAP since then She had bariatric surgery December 11 of 2019 During the process of evaluation and post surgery she has lost over 70 pounds  Continues to tolerate CPAP well with no significant issues  Usually tries to go to bed about 10 PM Wakes up a couple of times to use the bathroom, without CPAP usually wakes up almost hourly Final awakening time about 10 AM Has been having headaches Dryness of the mouth since has significant weight loss  Comorbidities include asthma, fibromyalgia, hypertension, diabetes, scoliosis, neuropathy  Does not smoke  Outpatient Encounter Medications as of 07/25/2019  Medication Sig  . albuterol (PROVENTIL HFA;VENTOLIN HFA) 108 (90 Base) MCG/ACT inhaler Inhale 2 puffs into the lungs every 6 (six) hours as needed for wheezing or shortness of breath.  . Ascorbic Acid (VITAMIN C) 1000 MG tablet Take 1,000 mg by mouth daily.  Marland Kitchen aspirin EC 81 MG tablet Take 1 tablet (81 mg total) by mouth 2 (two) times daily.  Marland Kitchen b complex vitamins tablet Take 1 tablet by mouth every evening.   . Biotin 5000 MCG TABS Take 15,000 mcg by mouth every evening.  . bisacodyl (DULCOLAX) 5 MG EC tablet Take 5 mg by mouth 2 (two) times daily as needed for moderate constipation (constipation.).  Marland Kitchen Calcium-Phosphorus-Vitamin D  (CITRACAL +D3 PO) Take 1 tablet by mouth every evening.  . cetirizine (KLS ALLER-TEC) 10 MG tablet Take 10 mg by mouth daily.  . Cholecalciferol (VITAMIN D3) 50 MCG (2000 UT) TABS Take 2,000 Units by mouth daily.  Mariane Baumgarten Calcium (STOOL SOFTENER PO) Take by mouth as needed.  . docusate sodium (COLACE) 100 MG capsule Take 300 mg by mouth at bedtime.  . DULoxetine (CYMBALTA) 60 MG capsule Take 60 mg by mouth daily.  . fluticasone (FLONASE) 50 MCG/ACT nasal spray Place 2 sprays into both nostrils daily. (Patient taking differently: Place 2 sprays into both nostrils daily as needed for allergies. )  . Methylcellulose, Laxative, (CITRUCEL) 500 MG TABS Take 500 mg by mouth 2 (two) times a day. Morning & Evening  . montelukast (SINGULAIR) 10 MG tablet Take 10 mg by mouth daily.   Marland Kitchen MUCINEX MAXIMUM STRENGTH 1200 MG TB12 Take 1,200 mg by mouth every evening.  . Multiple Vitamin (MULTIVITAMIN WITH MINERALS) TABS tablet Take 1 tablet by mouth daily. One-A-Day Women's  . saccharomyces boulardii (FLORASTOR) 250 MG capsule Take 250 mg by mouth 2 (two) times daily.  . SYMBICORT 160-4.5 MCG/ACT inhaler Inhale 2 puffs into the lungs 2 (two) times daily.   No facility-administered encounter medications on file as of 07/25/2019.     Allergies as of 07/25/2019 - Review Complete 07/25/2019  Allergen Reaction Noted  . Shellfish allergy Anaphylaxis 07/15/2016  . Pollen extract Other (See Comments) 04/03/2019    Past Medical History:  Diagnosis Date  .  Apnea   . Arthritis   . Asthma   . Diabetes mellitus without complication (HCC)   . Diverticulitis   . Fibromyalgia   . Hypertension     Past Surgical History:  Procedure Laterality Date  . ABDOMINAL HYSTERECTOMY    . ABDOMINAL SURGERY    . APPENDECTOMY    . BARIATRIC SURGERY N/A 08/10/2018  . BREAST BIOPSY    . CESAREAN SECTION    . CHOLECYSTECTOMY    . HERNIA REPAIR    . SINUS SURGERY WITH INSTATRAK    . TOTAL KNEE ARTHROPLASTY Left 04/03/2019    Procedure: Left Knee Arthroplasty;  Surgeon: Gean Birchwood, MD;  Location: [facility];  Service: Orthopedics;  Laterality: Left;  . WISDOM TOOTH EXTRACTION      History reviewed. No pertinent family history.  Social History   Socioeconomic History  . Marital status: Divorced    Spouse name: Not on file  . Number of children: Not on file  . Years of education: Not on file  . Highest education level: Not on file  Occupational History  . Not on file  Social Needs  . Financial resource strain: Not on file  . Food insecurity    Worry: Not on file    Inability: Not on file  . Transportation needs    Medical: Not on file    Non-medical: Not on file  Tobacco Use  . Smoking status: Never Smoker  . Smokeless tobacco: Never Used  Substance and Sexual Activity  . Alcohol use: No  . Drug use: No  . Sexual activity: Not on file  Lifestyle  . Physical activity    Days per week: Not on file    Minutes per session: Not on file  . Stress: Not on file  Relationships  . Social Musician on phone: Not on file    Gets together: Not on file    Attends religious service: Not on file    Active member of club or organization: Not on file    Attends meetings of clubs or organizations: Not on file    Relationship status: Not on file  . Intimate partner violence    Fear of current or ex partner: Not on file    Emotionally abused: Not on file    Physically abused: Not on file    Forced sexual activity: Not on file  Other Topics Concern  . Not on file  Social History Narrative  . Not on file    Review of Systems  Constitutional: Positive for fatigue.  HENT: Negative.   Eyes: Negative.   Respiratory: Positive for apnea. Negative for cough and shortness of breath.   Cardiovascular: Negative.   Gastrointestinal: Negative.   Genitourinary: Negative.   Psychiatric/Behavioral: Positive for sleep disturbance.  All other systems reviewed and are negative.   Vitals:   07/25/19  1135  BP: 126/80  Pulse: 78  Temp: 97.9 F (36.6 C)  SpO2: 98%     Physical Exam  Constitutional: She appears well-developed and well-nourished.  obese  HENT:  Head: Normocephalic and atraumatic.  Eyes: Pupils are equal, round, and reactive to light. Right eye exhibits no discharge. Left eye exhibits no discharge.  Neck: Neck supple. No JVD present. No tracheal deviation present. No thyromegaly present.  Cardiovascular: Normal rate and regular rhythm.  Pulmonary/Chest: Effort normal and breath sounds normal. No stridor. No respiratory distress. She has no wheezes. She has no rales. She exhibits no tenderness.  Abdominal:  Soft. Bowel sounds are normal. She exhibits no distension. There is no abdominal tenderness.    Results of the Epworth flowsheet 10/06/2018  Sitting and reading 0  Watching TV 0  Sitting, inactive in a public place (e.g. a theatre or a meeting) 0  As a passenger in a car for an hour without a break 1  Lying down to rest in the afternoon when circumstances permit 3  Sitting and talking to someone 0  Sitting quietly after a lunch without alcohol 0  In a car, while stopped for a few minutes in traffic 0  Total score 4    CPAP compliance data reviewed showing 100% compliance with residual AHI of 3.3, 37 minutes of leak  Assessment:  .  Obstructive sleep apnea -Diagnosed in 2006, has been very compliant with treatment -Improvement in daytime sleepiness -Currently on auto titrating machine  .  Morbid obesity -Has managed to lose 70 pounds and is continuing to lose weight -She continues to work on weight loss   Significant improvement in symptoms  Plan/Recommendations: .  Continue to titrating CPAP use  .  Mask issues will be addressed  .  I will see her back in the office in about 6 months  Encouraged to continue working on weight loss efforts  Virl DiamondAdewale Raymondo Garcialopez MD [facility] 07/25/2019, 11:43 AM  CC: No ref. provider found

## 2019-09-21 ENCOUNTER — Other Ambulatory Visit: Payer: Self-pay | Admitting: Orthopedic Surgery

## 2019-09-27 ENCOUNTER — Other Ambulatory Visit: Payer: Self-pay | Admitting: Orthopedic Surgery

## 2019-09-27 DIAGNOSIS — M19012 Primary osteoarthritis, left shoulder: Secondary | ICD-10-CM

## 2019-10-05 ENCOUNTER — Ambulatory Visit
Admission: RE | Admit: 2019-10-05 | Discharge: 2019-10-05 | Disposition: A | Discharge status: Home / Self Care | Payer: Medicare Other | Source: Ambulatory Visit | Attending: Orthopedic Surgery | Admitting: Orthopedic Surgery

## 2019-10-05 ENCOUNTER — Other Ambulatory Visit: Payer: Medicare Other

## 2019-10-05 DIAGNOSIS — M19012 Primary osteoarthritis, left shoulder: Secondary | ICD-10-CM

## 2019-10-11 NOTE — Patient Instructions (Addendum)
DUE TO COVID-19 ONLY ONE VISITOR IS ALLOWED TO COME WITH YOU AND STAY IN THE WAITING ROOM ONLY DURING PRE OP AND PROCEDURE DAY OF SURGERY. THE 1 VISITOR MAY VISIT WITH YOU AFTER SURGERY IN YOUR PRIVATE ROOM DURING VISITING HOURS ONLY!  YOU NEED TO HAVE A COVID 19 TEST ON__Monday 02/15/2021_____ @__10 :10 am_____, THIS TEST MUST BE DONE BEFORE SURGERY, COME  [place], [address] , [address].  ([place]) ONCE YOUR COVID TEST IS COMPLETED, PLEASE BEGIN THE QUARANTINE INSTRUCTIONS AS OUTLINED IN YOUR HANDOUT.                Carol Gilbert    Your procedure is scheduled on: Thursday 10/19/2019   Report to [facility] Main  Entrance    Report to Short Stay at  [facility]   AM     Call this number if you have problems the morning of surgery [phone]    Remember: Do not eat food  :After Midnight.     NO SOLID FOOD AFTER MIDNIGHT THE NIGHT PRIOR TO SURGERY. NOTHING BY MOUTH EXCEPT CLEAR LIQUIDS UNTIL  0430 am .     PLEASE FINISH Gatorade G 2  DRINK PER SURGEON ORDER  WHICH NEEDS TO BE COMPLETED AT  0430 am .   CLEAR LIQUID DIET   Foods Allowed                                                                     Foods Excluded  Coffee and tea, regular and decaf                             liquids that you cannot  Plain Jell-O any favor except red or purple                                           see through such as: Fruit ices (not with fruit pulp)                                     milk, soups, orange juice  Iced Popsicles                                    All solid food Carbonated beverages, regular and diet                                    Cranberry, grape and apple juices Sports drinks like Gatorade Lightly seasoned clear broth or consume(fat free) Sugar, honey syrup  Sample Menu Breakfast                                Lunch  Supper Cranberry juice                    Beef broth                             Chicken broth Jell-O                                     Grape juice                           Apple juice Coffee or tea                        Jell-O                                      Popsicle                                                Coffee or tea                        Coffee or tea  _____________________________________________________________________      BRUSH YOUR TEETH MORNING OF SURGERY AND RINSE YOUR MOUTH OUT, NO CHEWING GUM CANDY OR MINTS.     Take these medicines the morning of surgery with A SIP OF WATER: Duloxetine (Cymbalta), Cetirizine, Singulair, use Albuterol inhaler if needed and use Symbicort inhaler if needed and bring inhalers with you to  the hospital   DO NOT TAKE ANY DIABETIC MEDICATIONS DAY OF YOUR SURGERY!                               You may not have any metal on your body including hair pins and              piercings  Do not wear jewelry, make-up, lotions, powders or perfumes, deodorant             Do not wear nail polish on your fingernails.  Do not shave  48 hours prior to surgery.                 Do not bring valuables to the hospital. [facility] IS NOT             RESPONSIBLE   FOR VALUABLES.  Contacts, dentures or bridgework may not be worn into surgery.  Leave suitcase in the car. After surgery it may be brought to your room.     Patients discharged the day of surgery will not be allowed to drive home. IF YOU ARE HAVING SURGERY AND GOING HOME THE SAME DAY, YOU MUST HAVE AN ADULT TO DRIVE YOU HOME AND  BE WITH YOU FOR 24 HOURS. YOU MAY GO HOME BY TAXI OR UBER OR ORTHERWISE, BUT AN ADULT MUST ACCOMPANY YOU HOME AND STAY WITH YOU FOR 24 HOURS.  Name and phone number of your driver:son- Carol Gilbert  [phone]  Please read over the following fact sheets you were given: _____________________________________________________________________  [facility]- Preparing for Total Shoulder Arthroplasty    Before surgery, you  can play an important role. Because skin is not sterile, your skin needs to be as free of germs as possible. You can reduce the number of germs on your skin by using the following products. . Benzoyl Peroxide Gel o Reduces the number of germs present on the skin o Applied twice a day to shoulder area starting two days before surgery    ==================================================================  Please follow these instructions carefully:  BENZOYL PEROXIDE 5% GEL  Please do not use if you have an allergy to benzoyl peroxide.   If your skin becomes reddened/irritated stop using the benzoyl peroxide.  Starting two days before surgery, apply as follows: 1. Apply benzoyl peroxide in the morning and at night. Apply after taking a shower. If you are not taking a shower clean entire shoulder front, back, and side along with the armpit with a clean wet washcloth.  2. Place a quarter-sized dollop on your shoulder and rub in thoroughly, making sure to cover the front, back, and side of your shoulder, along with the armpit.   2 days before __x__ AM   __x__ PM              1 day before __x__ AM   __x__ PM                         3. Do this twice a day for two days.  (Last application is the night before surgery, AFTER using the CHG soap as described below).  4. Do NOT apply benzoyl peroxide gel on the day of surgery.            [facility] - Preparing for Surgery Before surgery, you can play an important role.  Because skin is not sterile, your skin needs to be as free of germs as possible.  You can reduce the number of germs on your skin by washing with CHG (chlorahexidine gluconate) soap before surgery.  CHG is an antiseptic cleaner which kills germs and bonds with the skin to continue killing germs even after washing. Please DO NOT use if you have an allergy to CHG or antibacterial soaps.  If your skin becomes reddened/irritated stop using the CHG and inform your nurse when you arrive at  Short Stay. Do not shave (including legs and underarms) for at least 48 hours prior to the first CHG shower.  You may shave your face/neck. Please follow these instructions carefully:  1.  Shower with CHG Soap the night before surgery and the  morning of Surgery.  2.  If you choose to wash your hair, wash your hair first as usual with your  normal  shampoo.  3.  After you shampoo, rinse your hair and body thoroughly to remove the  shampoo.                           4.  Use CHG as you would any other liquid soap.  You can apply chg directly  to the skin and wash                       Gently with a scrungie or clean washcloth.  5.  Apply the CHG Soap to your body ONLY FROM THE NECK DOWN.   Do not  use on face/ open                           Wound or open sores. Avoid contact with eyes, ears mouth and genitals (private parts).                       Wash face,  Genitals (private parts) with your normal soap.             6.  Wash thoroughly, paying special attention to the area where your surgery  will be performed.  7.  Thoroughly rinse your body with warm water from the neck down.  8.  DO NOT shower/wash with your normal soap after using and rinsing off  the CHG Soap.                9.  Pat yourself dry with a clean towel.            10.  Wear clean pajamas.            11.  Place clean sheets on your bed the night of your first shower and do not  sleep with pets. Day of Surgery : Do not apply any lotions/deodorants the morning of surgery.  Please wear clean clothes to the hospital/surgery center.  FAILURE TO FOLLOW THESE INSTRUCTIONS MAY RESULT IN THE CANCELLATION OF YOUR SURGERY PATIENT SIGNATURE_________________________________  NURSE SIGNATURE__________________________________  ________________________________________________________________________   Rogelia MireIncentive Spirometer  An incentive spirometer is a tool that can help keep your lungs clear and active. This tool measures how well you are  filling your lungs with each breath. Taking long deep breaths may help reverse or decrease the chance of developing breathing (pulmonary) problems (especially infection) following:  A long period of time when you are unable to move or be active. BEFORE THE PROCEDURE   If the spirometer includes an indicator to show your best effort, your nurse or respiratory therapist will set it to a desired goal.  If possible, sit up straight or lean slightly forward. Try not to slouch.  Hold the incentive spirometer in an upright position. INSTRUCTIONS FOR USE  1. Sit on the edge of your bed if possible, or sit up as far as you can in bed or on a chair. 2. Hold the incentive spirometer in an upright position. 3. Breathe out normally. 4. Place the mouthpiece in your mouth and seal your lips tightly around it. 5. Breathe in slowly and as deeply as possible, raising the piston or the ball toward the top of the column. 6. Hold your breath for 3-5 seconds or for as long as possible. Allow the piston or ball to fall to the bottom of the column. 7. Remove the mouthpiece from your mouth and breathe out normally. 8. Rest for a few seconds and repeat Steps 1 through 7 at least 10 times every 1-2 hours when you are awake. Take your time and take a few normal breaths between deep breaths. 9. The spirometer may include an indicator to show your best effort. Use the indicator as a goal to work toward during each repetition. 10. After each set of 10 deep breaths, practice coughing to be sure your lungs are clear. If you have an incision (the cut made at the time of surgery), support your incision when coughing by placing a pillow or rolled up towels firmly against it. Once you are able to get out of bed, walk  around indoors and cough well. You may stop using the incentive spirometer when instructed by your caregiver.  RISKS AND COMPLICATIONS  Take your time so you do not get dizzy or light-headed.  If you are in pain,  you may need to take or ask for pain medication before doing incentive spirometry. It is harder to take a deep breath if you are having pain. AFTER USE  Rest and breathe slowly and easily.  It can be helpful to keep track of a log of your progress. Your caregiver can provide you with a simple table to help with this. If you are using the spirometer at home, follow these instructions: SEEK MEDICAL CARE IF:   You are having difficultly using the spirometer.  You have trouble using the spirometer as often as instructed.  Your pain medication is not giving enough relief while using the spirometer.  You develop fever of 100.5 F (38.1 C) or higher. SEEK IMMEDIATE MEDICAL CARE IF:   You cough up bloody sputum that had not been present before.  You develop fever of 102 F (38.9 C) or greater.  You develop worsening pain at or near the incision site. MAKE SURE YOU:   Understand these instructions.  Will watch your condition.  Will get help right away if you are not doing well or get worse. Document Released: 12/28/2006 Document Revised: 11/09/2011 Document Reviewed: 02/28/2007 ExitCare Patient Information 2014 ExitCare, [place].   ________________________________________________________________________  WHAT IS A BLOOD TRANSFUSION? Blood Transfusion Information  A transfusion is the replacement of blood or some of its parts. Blood is made up of multiple cells which provide different functions.  Red blood cells carry oxygen and are used for blood loss replacement.  White blood cells fight against infection.  Platelets control bleeding.  Plasma helps clot blood.  Other blood products are available for specialized needs, such as hemophilia or other clotting disorders. BEFORE THE TRANSFUSION  Who gives blood for transfusions?   Healthy volunteers who are fully evaluated to make sure their blood is safe. This is blood bank blood. Transfusion therapy is the safest it has ever  been in the practice of medicine. Before blood is taken from a donor, a complete history is taken to make sure that person has no history of diseases nor engages in risky social behavior (examples are intravenous drug use or sexual activity with multiple partners). The donor's travel history is screened to minimize risk of transmitting infections, such as malaria. The donated blood is tested for signs of infectious diseases, such as HIV and hepatitis. The blood is then tested to be sure it is compatible with you in order to minimize the chance of a transfusion reaction. If you or a relative donates blood, this is often done in anticipation of surgery and is not appropriate for emergency situations. It takes many days to process the donated blood. RISKS AND COMPLICATIONS Although transfusion therapy is very safe and saves many lives, the main dangers of transfusion include:   Getting an infectious disease.  Developing a transfusion reaction. This is an allergic reaction to something in the blood you were given. Every precaution is taken to prevent this. The decision to have a blood transfusion has been considered carefully by your caregiver before blood is given. Blood is not given unless the benefits outweigh the risks. AFTER THE TRANSFUSION  Right after receiving a blood transfusion, you will usually feel much better and more energetic. This is especially true if your red blood cells have  gotten low (anemic). The transfusion raises the level of the red blood cells which carry oxygen, and this usually causes an energy increase.  The nurse administering the transfusion will monitor you carefully for complications. HOME CARE INSTRUCTIONS  No special instructions are needed after a transfusion. You may find your energy is better. Speak with your caregiver about any limitations on activity for underlying diseases you may have. SEEK MEDICAL CARE IF:   Your condition is not improving after your  transfusion.  You develop redness or irritation at the intravenous (IV) site. SEEK IMMEDIATE MEDICAL CARE IF:  Any of the following symptoms occur over the next 12 hours:  Shaking chills.  You have a temperature by mouth above 102 F (38.9 C), not controlled by medicine.  Chest, back, or muscle pain.  People around you feel you are not acting correctly or are confused.  Shortness of breath or difficulty breathing.  Dizziness and fainting.  You get a rash or develop hives.  You have a decrease in urine output.  Your urine turns a dark color or changes to pink, red, or brown. Any of the following symptoms occur over the next 10 days:  You have a temperature by mouth above 102 F (38.9 C), not controlled by medicine.  Shortness of breath.  Weakness after normal activity.  The white part of the eye turns yellow (jaundice).  You have a decrease in the amount of urine or are urinating less often.  Your urine turns a dark color or changes to pink, red, or brown. Document Released: 08/14/2000 Document Revised: 11/09/2011 Document Reviewed: 04/02/2008 [facility] Patient Information 2014 [place], [place].  _______________________________________________________________________

## 2019-10-12 ENCOUNTER — Encounter (HOSPITAL_COMMUNITY)
Admission: RE | Admit: 2019-10-12 | Discharge: 2019-10-12 | Disposition: A | Discharge status: Home / Self Care | Payer: Medicare Other | Source: Ambulatory Visit | Attending: Orthopedic Surgery | Admitting: Orthopedic Surgery

## 2019-10-12 ENCOUNTER — Other Ambulatory Visit: Payer: Self-pay

## 2019-10-12 ENCOUNTER — Encounter (HOSPITAL_COMMUNITY): Payer: Self-pay

## 2019-10-12 DIAGNOSIS — R9431 Abnormal electrocardiogram [ECG] [EKG]: Secondary | ICD-10-CM | POA: Diagnosis not present

## 2019-10-12 DIAGNOSIS — Z01812 Encounter for preprocedural laboratory examination: Secondary | ICD-10-CM | POA: Diagnosis present

## 2019-10-12 DIAGNOSIS — Z01810 Encounter for preprocedural cardiovascular examination: Secondary | ICD-10-CM | POA: Diagnosis present

## 2019-10-12 NOTE — Progress Notes (Signed)
PCP - Azzie Roup, FNP   LOV- 02/08/201  epic Cardiologist - n/a Pulmonary- Dr. Virl Diamond  LOV-07/25/2019   For OSA Epic Pulmonary for asthma- Dr. Marchelle Gearing  LOV-11/01/2018  Epic  Chest x-ray - 03/31/2019  Epic CT chest w/o contrast-05/22/2019 EKG - 03/31/2019  epic Stress Test - n/a ECHO - n/a Cardiac Cath - n/a  Sleep Study - 10/06/2018 epic CPAP - yes  Fasting Blood Sugar - does not check Checks Blood Sugar __0___ times a day 09/11/2019- HgA1C- 5.4  In care everywhere [facility]  Blood Thinner Instructions:n/a Aspirin Instructions:Aspirin 81 mg as preventative treatment from Azzie Roup, FNP. Instructed patient to call Dr. Veda Canning office to get instructions from them about stopping Aspirin. Patient verbalized understanding. Last Dose:10/11/2019  Anesthesia review:  Chart to be reviewed by Jodell Cipro, PA.  Patient has a history of asthma, DM type 2 , HTN, fibromyalgia,  And s/p bariatric surgery.  Patient denies shortness of breath, fever, cough and chest pain at PAT appointment   Patient verbalized understanding of instructions that were given to them at the PAT appointment. Patient was also instructed that they will need to review over the PAT instructions again at home before surgery.

## 2019-10-13 ENCOUNTER — Encounter (HOSPITAL_COMMUNITY)
Admission: RE | Admit: 2019-10-13 | Discharge: 2019-10-13 | Disposition: A | Discharge status: Home / Self Care | Payer: Medicare Other | Source: Ambulatory Visit | Attending: Orthopedic Surgery | Admitting: Orthopedic Surgery

## 2019-10-13 DIAGNOSIS — Z01810 Encounter for preprocedural cardiovascular examination: Secondary | ICD-10-CM | POA: Diagnosis not present

## 2019-10-13 DIAGNOSIS — R9431 Abnormal electrocardiogram [ECG] [EKG]: Secondary | ICD-10-CM | POA: Diagnosis not present

## 2019-10-13 DIAGNOSIS — Z01812 Encounter for preprocedural laboratory examination: Secondary | ICD-10-CM | POA: Diagnosis not present

## 2019-10-13 DIAGNOSIS — Z01811 Encounter for preprocedural respiratory examination: Secondary | ICD-10-CM

## 2019-10-13 LAB — APTT: aPTT: 30 seconds (ref 24–36)

## 2019-10-13 LAB — CBC WITH DIFFERENTIAL/PLATELET
Abs Immature Granulocytes: 0.02 10*3/uL (ref 0.00–0.07)
Basophils Absolute: 0.1 10*3/uL (ref 0.0–0.1)
Basophils Relative: 1 %
Eosinophils Absolute: 0.1 10*3/uL (ref 0.0–0.5)
Eosinophils Relative: 1 %
HCT: 41.3 % (ref 36.0–46.0)
Hemoglobin: 13 g/dL (ref 12.0–15.0)
Immature Granulocytes: 0 %
Lymphocytes Relative: 26 %
Lymphs Abs: 2.2 10*3/uL (ref 0.7–4.0)
MCH: 29.3 pg (ref 26.0–34.0)
MCHC: 31.5 g/dL (ref 30.0–36.0)
MCV: 93 fL (ref 80.0–100.0)
Monocytes Absolute: 0.4 10*3/uL (ref 0.1–1.0)
Monocytes Relative: 5 %
Neutro Abs: 5.6 10*3/uL (ref 1.7–7.7)
Neutrophils Relative %: 67 %
Platelets: 259 10*3/uL (ref 150–400)
RBC: 4.44 MIL/uL (ref 3.87–5.11)
RDW: 13.6 % (ref 11.5–15.5)
WBC: 8.4 10*3/uL (ref 4.0–10.5)
nRBC: 0 % (ref 0.0–0.2)

## 2019-10-13 LAB — URINALYSIS, ROUTINE W REFLEX MICROSCOPIC
Bilirubin Urine: NEGATIVE
Glucose, UA: NEGATIVE mg/dL
Hgb urine dipstick: NEGATIVE
Ketones, ur: NEGATIVE mg/dL
Leukocytes,Ua: NEGATIVE
Nitrite: NEGATIVE
Protein, ur: NEGATIVE mg/dL
Specific Gravity, Urine: 1.016 (ref 1.005–1.030)
pH: 6 (ref 5.0–8.0)

## 2019-10-13 LAB — COMPREHENSIVE METABOLIC PANEL
ALT: 17 U/L (ref 0–44)
AST: 17 U/L (ref 15–41)
Albumin: 3.7 g/dL (ref 3.5–5.0)
Alkaline Phosphatase: 87 U/L (ref 38–126)
Anion gap: 7 (ref 5–15)
BUN: 22 mg/dL (ref 8–23)
CO2: 31 mmol/L (ref 22–32)
Calcium: 10 mg/dL (ref 8.9–10.3)
Chloride: 103 mmol/L (ref 98–111)
Creatinine, Ser: 0.55 mg/dL (ref 0.44–1.00)
GFR calc Af Amer: >60 mL/min (ref >60)
GFR calc non Af Amer: >60 mL/min (ref >60)
Glucose, Bld: 136 mg/dL — ABNORMAL HIGH (ref 70–99)
Potassium: 3.6 mmol/L (ref 3.5–5.1)
Sodium: 141 mmol/L (ref 135–145)
Total Bilirubin: 0.5 mg/dL (ref 0.3–1.2)
Total Protein: 6.8 g/dL (ref 6.5–8.1)

## 2019-10-13 LAB — PROTIME-INR
INR: 1 (ref 0.8–1.2)
Prothrombin Time: 12.9 seconds (ref 11.4–15.2)

## 2019-10-13 LAB — SURGICAL PCR SCREEN
MRSA, PCR: NEGATIVE
Staphylococcus aureus: NEGATIVE

## 2019-10-16 ENCOUNTER — Other Ambulatory Visit (HOSPITAL_COMMUNITY)
Admission: RE | Admit: 2019-10-16 | Discharge: 2019-10-16 | Disposition: A | Discharge status: Home / Self Care | Payer: Medicare Other | Source: Ambulatory Visit | Attending: Orthopedic Surgery | Admitting: Orthopedic Surgery

## 2019-10-16 DIAGNOSIS — Z01812 Encounter for preprocedural laboratory examination: Secondary | ICD-10-CM | POA: Diagnosis present

## 2019-10-16 DIAGNOSIS — Z20822 Contact with and (suspected) exposure to covid-19: Secondary | ICD-10-CM | POA: Insufficient documentation

## 2019-10-16 LAB — SARS CORONAVIRUS 2 (TAT 6-24 HRS): SARS Coronavirus 2: NEGATIVE

## 2019-10-19 ENCOUNTER — Encounter (HOSPITAL_COMMUNITY): Payer: Self-pay | Admitting: Orthopedic Surgery

## 2019-10-19 ENCOUNTER — Ambulatory Visit (HOSPITAL_COMMUNITY): Payer: Medicare Other

## 2019-10-19 ENCOUNTER — Encounter (HOSPITAL_COMMUNITY)
Admission: RE | Disposition: A | Discharge status: Home / Self Care | Payer: Self-pay | Source: Home / Self Care | Attending: Orthopedic Surgery

## 2019-10-19 ENCOUNTER — Ambulatory Visit (HOSPITAL_COMMUNITY)
Admission: RE | Admit: 2019-10-19 | Discharge: 2019-10-19 | Disposition: A | Discharge status: Home / Self Care | Payer: Medicare Other | Attending: Orthopedic Surgery | Admitting: Orthopedic Surgery

## 2019-10-19 ENCOUNTER — Ambulatory Visit (HOSPITAL_COMMUNITY): Payer: Medicare Other | Admitting: Registered Nurse

## 2019-10-19 ENCOUNTER — Ambulatory Visit (HOSPITAL_COMMUNITY): Payer: Medicare Other | Admitting: Physician Assistant

## 2019-10-19 DIAGNOSIS — Z96652 Presence of left artificial knee joint: Secondary | ICD-10-CM | POA: Insufficient documentation

## 2019-10-19 DIAGNOSIS — E119 Type 2 diabetes mellitus without complications: Secondary | ICD-10-CM | POA: Insufficient documentation

## 2019-10-19 DIAGNOSIS — Z7951 Long term (current) use of inhaled steroids: Secondary | ICD-10-CM | POA: Insufficient documentation

## 2019-10-19 DIAGNOSIS — J45909 Unspecified asthma, uncomplicated: Secondary | ICD-10-CM | POA: Insufficient documentation

## 2019-10-19 DIAGNOSIS — M19012 Primary osteoarthritis, left shoulder: Secondary | ICD-10-CM | POA: Insufficient documentation

## 2019-10-19 DIAGNOSIS — Z7982 Long term (current) use of aspirin: Secondary | ICD-10-CM | POA: Insufficient documentation

## 2019-10-19 DIAGNOSIS — Z79899 Other long term (current) drug therapy: Secondary | ICD-10-CM | POA: Diagnosis not present

## 2019-10-19 DIAGNOSIS — Z96612 Presence of left artificial shoulder joint: Secondary | ICD-10-CM

## 2019-10-19 DIAGNOSIS — M25512 Pain in left shoulder: Secondary | ICD-10-CM | POA: Diagnosis present

## 2019-10-19 DIAGNOSIS — I10 Essential (primary) hypertension: Secondary | ICD-10-CM | POA: Diagnosis not present

## 2019-10-19 HISTORY — PX: TOTAL SHOULDER ARTHROPLASTY: SHX126

## 2019-10-19 LAB — GLUCOSE, CAPILLARY
Glucose-Capillary: 91 mg/dL (ref 70–99)
Glucose-Capillary: 98 mg/dL (ref 70–99)

## 2019-10-19 LAB — TYPE AND SCREEN
ABO/RH(D): O POS
Antibody Screen: NEGATIVE

## 2019-10-19 SURGERY — ARTHROPLASTY, SHOULDER, TOTAL
Anesthesia: General | Site: Shoulder | Laterality: Left

## 2019-10-19 MED ORDER — ONDANSETRON HCL 4 MG/2ML IJ SOLN
INTRAMUSCULAR | Status: DC | PRN
  Administered 2019-10-19: 4 mg via INTRAVENOUS

## 2019-10-19 MED ORDER — MIDAZOLAM HCL 5 MG/5ML IJ SOLN
INTRAMUSCULAR | Status: DC | PRN
  Administered 2019-10-19: 2 mg via INTRAVENOUS

## 2019-10-19 MED ORDER — FENTANYL CITRATE (PF) 100 MCG/2ML IJ SOLN
INTRAMUSCULAR | Status: AC
  Filled 2019-10-19: qty 2

## 2019-10-19 MED ORDER — PHENYLEPHRINE HCL (PRESSORS) 10 MG/ML IV SOLN
INTRAVENOUS | Status: AC
  Filled 2019-10-19: qty 2

## 2019-10-19 MED ORDER — LIDOCAINE 2% (20 MG/ML) 5 ML SYRINGE
INTRAMUSCULAR | Status: DC | PRN
  Administered 2019-10-19: 80 mg via INTRAVENOUS

## 2019-10-19 MED ORDER — ROCURONIUM BROMIDE 10 MG/ML (PF) SYRINGE
PREFILLED_SYRINGE | INTRAVENOUS | Status: AC
  Filled 2019-10-19: qty 10

## 2019-10-19 MED ORDER — SUCCINYLCHOLINE CHLORIDE 200 MG/10ML IV SOSY
PREFILLED_SYRINGE | INTRAVENOUS | Status: AC
  Filled 2019-10-19: qty 10

## 2019-10-19 MED ORDER — DEXAMETHASONE SODIUM PHOSPHATE 10 MG/ML IJ SOLN
INTRAMUSCULAR | Status: AC
  Filled 2019-10-19: qty 1

## 2019-10-19 MED ORDER — MEPERIDINE HCL 50 MG/ML IJ SOLN: 6.2500 mg | INTRAMUSCULAR | Status: DC | PRN

## 2019-10-19 MED ORDER — ACETAMINOPHEN 325 MG PO TABS: 325.0000 mg | ORAL_TABLET | Freq: Once | ORAL | Status: DC | PRN

## 2019-10-19 MED ORDER — ACETAMINOPHEN 160 MG/5ML PO SOLN: 325.0000 mg | Freq: Once | ORAL | Status: DC | PRN

## 2019-10-19 MED ORDER — LACTATED RINGERS IV SOLN: INTRAVENOUS | Status: DC

## 2019-10-19 MED ORDER — TRANEXAMIC ACID-NACL 1000-0.7 MG/100ML-% IV SOLN
1000.0000 mg | INTRAVENOUS | Status: AC
  Administered 2019-10-19: 1000 mg via INTRAVENOUS
  Filled 2019-10-19: qty 100

## 2019-10-19 MED ORDER — OXYCODONE HCL 5 MG/5ML PO SOLN: 5.0000 mg | Freq: Once | ORAL | Status: DC | PRN

## 2019-10-19 MED ORDER — PHENYLEPHRINE HCL-NACL 20-0.9 MG/250ML-% IV SOLN
INTRAVENOUS | Status: DC | PRN
  Administered 2019-10-19: 5 ug/min via INTRAVENOUS

## 2019-10-19 MED ORDER — TIZANIDINE HCL 4 MG PO TABS: 4.0000 mg | ORAL_TABLET | Freq: Three times a day (TID) | ORAL | 1 refills | Status: DC | PRN

## 2019-10-19 MED ORDER — SUCCINYLCHOLINE CHLORIDE 200 MG/10ML IV SOSY
PREFILLED_SYRINGE | INTRAVENOUS | Status: DC | PRN
  Administered 2019-10-19: 120 mg via INTRAVENOUS

## 2019-10-19 MED ORDER — STERILE WATER FOR IRRIGATION IR SOLN
Status: DC | PRN
  Administered 2019-10-19: 2000 mL

## 2019-10-19 MED ORDER — OXYCODONE HCL 5 MG PO TABS: 5.0000 mg | ORAL_TABLET | Freq: Once | ORAL | Status: DC | PRN

## 2019-10-19 MED ORDER — EPHEDRINE SULFATE-NACL 50-0.9 MG/10ML-% IV SOSY
PREFILLED_SYRINGE | INTRAVENOUS | Status: DC | PRN
  Administered 2019-10-19: 5 mg via INTRAVENOUS

## 2019-10-19 MED ORDER — LIDOCAINE 2% (20 MG/ML) 5 ML SYRINGE
INTRAMUSCULAR | Status: AC
  Filled 2019-10-19: qty 5

## 2019-10-19 MED ORDER — ROCURONIUM BROMIDE 10 MG/ML (PF) SYRINGE
PREFILLED_SYRINGE | INTRAVENOUS | Status: DC | PRN
  Administered 2019-10-19: 10 mg via INTRAVENOUS
  Administered 2019-10-19: 40 mg via INTRAVENOUS

## 2019-10-19 MED ORDER — PROMETHAZINE HCL 25 MG/ML IJ SOLN: 6.2500 mg | INTRAMUSCULAR | Status: DC | PRN

## 2019-10-19 MED ORDER — ONDANSETRON HCL 4 MG/2ML IJ SOLN
INTRAMUSCULAR | Status: AC
  Filled 2019-10-19: qty 2

## 2019-10-19 MED ORDER — FENTANYL CITRATE (PF) 100 MCG/2ML IJ SOLN
INTRAMUSCULAR | Status: DC | PRN
  Administered 2019-10-19 (×3): 50 ug via INTRAVENOUS

## 2019-10-19 MED ORDER — DEXAMETHASONE SODIUM PHOSPHATE 10 MG/ML IJ SOLN
INTRAMUSCULAR | Status: DC | PRN
  Administered 2019-10-19: 8 mg via INTRAVENOUS

## 2019-10-19 MED ORDER — SODIUM CHLORIDE 0.9 % IR SOLN
Status: DC | PRN
  Administered 2019-10-19: 1000 mL

## 2019-10-19 MED ORDER — PROPOFOL 10 MG/ML IV BOLUS
INTRAVENOUS | Status: DC | PRN
  Administered 2019-10-19: 140 mg via INTRAVENOUS

## 2019-10-19 MED ORDER — CEFAZOLIN SODIUM-DEXTROSE 2-4 GM/100ML-% IV SOLN
2.0000 g | INTRAVENOUS | Status: AC
  Administered 2019-10-19: 2 g via INTRAVENOUS
  Filled 2019-10-19: qty 100

## 2019-10-19 MED ORDER — HYDROMORPHONE HCL 1 MG/ML IJ SOLN: 0.2500 mg | INTRAMUSCULAR | Status: DC | PRN

## 2019-10-19 MED ORDER — SUGAMMADEX SODIUM 200 MG/2ML IV SOLN
INTRAVENOUS | Status: DC | PRN
  Administered 2019-10-19: 200 mg via INTRAVENOUS

## 2019-10-19 MED ORDER — CHLORHEXIDINE GLUCONATE 4 % EX LIQD: 60.0000 mL | Freq: Once | CUTANEOUS | Status: DC

## 2019-10-19 MED ORDER — PROPOFOL 10 MG/ML IV BOLUS
INTRAVENOUS | Status: AC
  Filled 2019-10-19: qty 20

## 2019-10-19 MED ORDER — OXYCODONE-ACETAMINOPHEN 5-325 MG PO TABS: ORAL_TABLET | ORAL | 0 refills | Status: DC

## 2019-10-19 MED ORDER — ACETAMINOPHEN 10 MG/ML IV SOLN: 1000.0000 mg | Freq: Once | INTRAVENOUS | Status: DC | PRN

## 2019-10-19 MED ORDER — EPHEDRINE 5 MG/ML INJ
INTRAVENOUS | Status: AC
  Filled 2019-10-19: qty 10

## 2019-10-19 MED ORDER — BUPIVACAINE HCL (PF) 0.5 % IJ SOLN
INTRAMUSCULAR | Status: DC | PRN
  Administered 2019-10-19: 12 mL via PERINEURAL

## 2019-10-19 MED ORDER — BUPIVACAINE LIPOSOME 1.3 % IJ SUSP
INTRAMUSCULAR | Status: DC | PRN
  Administered 2019-10-19: 10 mL via PERINEURAL

## 2019-10-19 MED ORDER — MIDAZOLAM HCL 2 MG/2ML IJ SOLN
INTRAMUSCULAR | Status: AC
  Filled 2019-10-19: qty 2

## 2019-10-19 SURGICAL SUPPLY — 71 items
BAG ZIPLOCK 12X15 (MISCELLANEOUS) ×3 IMPLANT
BIT DRILL 1.6MX128 (BIT) ×2 IMPLANT
BIT DRILL 1.6MX128MM (BIT) ×1
BLADE SAW SAG 73X25 THK (BLADE) ×2
BLADE SAW SGTL 18X1.27X75 (BLADE) IMPLANT
BLADE SAW SGTL 18X1.27X75MM (BLADE)
BLADE SAW SGTL 73X25 THK (BLADE) ×1 IMPLANT
CEMENT BONE DEPUY (Cement) ×3 IMPLANT
CHLORAPREP W/TINT 26 (MISCELLANEOUS) ×6 IMPLANT
CLOSURE WOUND 1/2 X4 (GAUZE/BANDAGES/DRESSINGS) ×1
COOLER ICEMAN CLASSIC (MISCELLANEOUS) IMPLANT
COVER BACK TABLE 60X90IN (DRAPES) ×3 IMPLANT
COVER SURGICAL LIGHT HANDLE (MISCELLANEOUS) ×3 IMPLANT
COVER WAND RF STERILE (DRAPES) IMPLANT
DRAPE INCISE IOBAN 66X45 STRL (DRAPES) ×3 IMPLANT
DRAPE ORTHO SPLIT 77X108 STRL (DRAPES) ×4
DRAPE POUCH INSTRU U-SHP 10X18 (DRAPES) ×3 IMPLANT
DRAPE SHEET LG 3/4 BI-LAMINATE (DRAPES) ×6 IMPLANT
DRAPE SURG 17X11 SM STRL (DRAPES) ×3 IMPLANT
DRAPE SURG ORHT 6 SPLT 77X108 (DRAPES) ×2 IMPLANT
DRAPE U-SHAPE 47X51 STRL (DRAPES) ×3 IMPLANT
DRSG AQUACEL AG ADV 3.5X 6 (GAUZE/BANDAGES/DRESSINGS) ×3 IMPLANT
ELECT BLADE TIP CTD 4 INCH (ELECTRODE) ×3 IMPLANT
ELECT REM PT RETURN 15FT ADLT (MISCELLANEOUS) ×3 IMPLANT
GLENOID PEG SHOULDER 40MM SML (Shoulder) ×1 IMPLANT
GLOVE BIO SURGEON STRL SZ7 (GLOVE) ×3 IMPLANT
GLOVE BIO SURGEON STRL SZ7.5 (GLOVE) ×3 IMPLANT
GLOVE BIOGEL PI IND STRL 7.0 (GLOVE) ×1 IMPLANT
GLOVE BIOGEL PI IND STRL 8 (GLOVE) ×1 IMPLANT
GLOVE BIOGEL PI INDICATOR 7.0 (GLOVE) ×2
GLOVE BIOGEL PI INDICATOR 8 (GLOVE) ×2
GOWN STRL REUS W/TWL LRG LVL3 (GOWN DISPOSABLE) ×6 IMPLANT
GOWN STRL REUS W/TWL XL LVL3 (GOWN DISPOSABLE) ×3 IMPLANT
GUIDEWIRE GLENOID 2.5X220 (WIRE) ×3 IMPLANT
HANDPIECE INTERPULSE COAX TIP (DISPOSABLE) ×2
HEAD HUMERAL 43X16 COBALT (Miscellaneous) ×3 IMPLANT
HEMOSTAT SURGICEL 2X14 (HEMOSTASIS) ×3 IMPLANT
HOOD PEEL AWAY FLYTE STAYCOOL (MISCELLANEOUS) ×9 IMPLANT
KIT BASIN OR (CUSTOM PROCEDURE TRAY) ×3 IMPLANT
KIT TURNOVER KIT A (KITS) IMPLANT
MANIFOLD NEPTUNE II (INSTRUMENTS) ×3 IMPLANT
NEEDLE MA TROC 1/2 (NEEDLE) ×3 IMPLANT
NEEDLE TROCAR POINT SZ 2 1/2 (NEEDLE) ×3 IMPLANT
NS IRRIG 1000ML POUR BTL (IV SOLUTION) ×3 IMPLANT
PACK SHOULDER (CUSTOM PROCEDURE TRAY) ×3 IMPLANT
PAD COLD SHLDR WRAP-ON (PAD) IMPLANT
PENCIL SMOKE EVACUATOR (MISCELLANEOUS) IMPLANT
PROTECTOR NERVE ULNAR (MISCELLANEOUS) ×3 IMPLANT
RESTRAINT HEAD UNIVERSAL NS (MISCELLANEOUS) ×3 IMPLANT
RETRIEVER SUT HEWSON (MISCELLANEOUS) ×3 IMPLANT
SET HNDPC FAN SPRY TIP SCT (DISPOSABLE) ×1 IMPLANT
SHOULDER GLENOID PEG 40MM SML (Shoulder) ×3 IMPLANT
SLEEVE SURGEON STRL (DRAPES) ×3 IMPLANT
SLING ARM IMMOBILIZER LRG (SOFTGOODS) ×6 IMPLANT
SMARTMIX MINI TOWER (MISCELLANEOUS) ×3
SPONGE LAP 18X18 RF (DISPOSABLE) ×3 IMPLANT
STEM HUM AEQUALIS STD SZ1B 66 (Stem) ×3 IMPLANT
STRIP CLOSURE SKIN 1/2X4 (GAUZE/BANDAGES/DRESSINGS) ×2 IMPLANT
SUCTION FRAZIER HANDLE 12FR (TUBING) ×2
SUCTION TUBE FRAZIER 12FR DISP (TUBING) ×1 IMPLANT
SUPPORT WRAP ARM LG (MISCELLANEOUS) ×3 IMPLANT
SUT ETHIBOND 2 V 37 (SUTURE) ×3 IMPLANT
SUT MNCRL AB 4-0 PS2 18 (SUTURE) ×3 IMPLANT
SUT VIC AB 2-0 CT1 27 (SUTURE) ×2
SUT VIC AB 2-0 CT1 TAPERPNT 27 (SUTURE) ×1 IMPLANT
TAPE LABRALWHITE 1.5X36 (TAPE) IMPLANT
TAPE SUT LABRALTAP WHT/BLK (SUTURE) IMPLANT
TOWEL OR 17X26 10 PK STRL BLUE (TOWEL DISPOSABLE) ×3 IMPLANT
TOWER SMARTMIX MINI (MISCELLANEOUS) ×1 IMPLANT
WATER STERILE IRR 1000ML POUR (IV SOLUTION) ×3 IMPLANT
YANKAUER SUCT BULB TIP 10FT TU (MISCELLANEOUS) ×3 IMPLANT

## 2019-10-19 NOTE — Discharge Instructions (Signed)
Discharge Instructions after Total Shoulder Arthroplasty   . A sling has been provided for you. Remove the sling 5 times each day to perform motion exercises. Use ice on the shoulder intermittently over the first 48 hours after surgery.  . Pain medication has been prescribed for you.  . Use your medication liberally over the first 48 hours, and then begin to taper your use. You may take Extra Strength Tylenol or Tylenol only in place of the pain pills. DO NOT take ANY nonsteroidal anti-inflammatory pain medications: Advil, Motrin, Ibuprofen, Aleve, Naproxen, or Naprosyn. . Take one aspirin a day for 2 weeks after surgery, unless you have an aspirin sensitivity/allergy or asthma. . Leave your dressing on until your first follow up visit.  You may shower with the dressing.  Hold your arm as if you still have your sling on while you shower. . Active reaching and lifting are not permitted. You may use the operative arm for activities of daily living that do not require the operative arm to leave the side of the body, such as eating, drinking, bathing, etc.  . Three to 5 times each day you should perform assisted overhead reaching and external rotation (outward turning) exercises with the operative arm. You were taught these exercises prior to discharge. Both exercises should be done with the non-operative arm used as the "therapist arm" while the operative arm remains relaxed. Ten of each exercise should be done three to five times each day.   Overhead reach is helping to lift your stiff arm up as high as it will go. To stretch your overhead reach, lie flat on your back, relax, and grasp the wrist of the tight shoulder with your opposite hand. Using the power in your opposite arm, bring the stiff arm up as far as it is comfortable. Start holding it for ten seconds and then work up to where you can hold it for a count of 30. Breathe slowly and deeply while the arm is moved. Repeat this stretch ten times,  trying to help the ar up a little higher each time.     External rotation is turning the arm out to the side while your elbow stays close to your body. External rotation is best stretched while you are lying on your back. Hold a cane, yardstick, broom handle, or dowel in both hands. Bend both elbows to a right angle. Use steady, gentle force from your normal arm to rotate the hand of the stiff shoulder out away from your body. Continue the rotation until it is straight in front of you holding it there for a count of 10. Do not go beyond this level of rotation until seen back by Dr. Chandler. Repeat this exercise ten times slowly.      Please call [phone] during normal business hours or [phone] after hours for any problems. Including the following:  - excessive redness of the incisions - drainage for more than 4 days - fever of more than 101.5 F  *Please note that pain medications will not be refilled after hours or on weekends.    

## 2019-10-19 NOTE — H&P (Signed)
Carol Gilbert is an 69 y.o. female.   Chief Complaint: L shoulder pain and dysfunction HPI: Endstage L shoulder arthritis with significant pain and dysfunction, failed conservative measures.  Pain interferes with sleep and quality of life.   Past Medical History:  Diagnosis Date  . Apnea   . Arthritis   . Asthma   . Diabetes mellitus without complication (HCC)    no medications  . Diverticulitis   . Fibromyalgia   . Hypertension     Past Surgical History:  Procedure Laterality Date  . ABDOMINAL HYSTERECTOMY    . ABDOMINAL SURGERY    . APPENDECTOMY    . BARIATRIC SURGERY N/A 08/10/2018  . BREAST BIOPSY    . CESAREAN SECTION    . CHOLECYSTECTOMY    . HERNIA REPAIR    . SINUS SURGERY WITH INSTATRAK    . TOTAL KNEE ARTHROPLASTY Left 04/03/2019   Procedure: Left Knee Arthroplasty;  Surgeon: Gean Birchwood, MD;  Location: [facility];  Service: Orthopedics;  Laterality: Left;  . WISDOM TOOTH EXTRACTION      History reviewed. No pertinent family history. Social History:  reports that she has never smoked. She has never used smokeless tobacco. She reports that she does not drink alcohol or use drugs.  Allergies:  Allergies  Allergen Reactions  . Shellfish Allergy Anaphylaxis  . Pollen Extract Other (See Comments)    Dust and smoke/ causes watery eyes; sneezing    Medications Prior to Admission  Medication Sig Dispense Refill  . Ascorbic Acid (VITAMIN C) 1000 MG tablet Take 1,000 mg by mouth daily.    Marland Kitchen aspirin EC 81 MG tablet Take 1 tablet (81 mg total) by mouth 2 (two) times daily. (Patient taking differently: Take 81 mg by mouth daily. ) 60 tablet 0  . b complex vitamins tablet Take 1 tablet by mouth every evening.     . Biotin 5000 MCG TABS Take 10,000 mcg by mouth every evening.     . bisacodyl (DULCOLAX) 5 MG EC tablet Take 5 mg by mouth daily.     . Calcium-Phosphorus-Vitamin D (CITRACAL +D3 PO) Take 1 tablet by mouth every evening.    . cetirizine (KLS ALLER-TEC) 10  MG tablet Take 10 mg by mouth daily.    . Cholecalciferol (VITAMIN D3) 125 MCG (5000 UT) CAPS Take 5,000 Units by mouth daily.     Marland Kitchen docusate sodium (COLACE) 100 MG capsule Take 300 mg by mouth at bedtime.    . DULoxetine (CYMBALTA) 60 MG capsule Take 60 mg by mouth daily.    Marland Kitchen levalbuterol (XOPENEX HFA) 45 MCG/ACT inhaler Inhale 1-2 puffs into the lungs every 4 (four) hours as needed for wheezing.    Marland Kitchen losartan-hydrochlorothiazide (HYZAAR) 50-12.5 MG tablet Take 1 tablet by mouth daily.    . Methylcellulose, Laxative, (CITRUCEL) 500 MG TABS Take 500 mg by mouth 2 (two) times a day.     . montelukast (SINGULAIR) 10 MG tablet Take 10 mg by mouth daily.     Marland Kitchen MUCINEX MAXIMUM STRENGTH 1200 MG TB12 Take 1,200 mg by mouth every evening. Takes every other day in the evening    . Multiple Vitamin (MULTIVITAMIN WITH MINERALS) TABS tablet Take 1 tablet by mouth daily. One-A-Day Women's    . saccharomyces boulardii (FLORASTOR) 250 MG capsule Take 250 mg by mouth daily.     . SYMBICORT 160-4.5 MCG/ACT inhaler Inhale 2 puffs into the lungs 2 (two) times daily. 1 Inhaler 5  . albuterol (PROVENTIL HFA;VENTOLIN  HFA) 108 (90 Base) MCG/ACT inhaler Inhale 2 puffs into the lungs every 6 (six) hours as needed for wheezing or shortness of breath. (Patient not taking: Reported on 10/05/2019) 1 Inhaler 6  . fluticasone (FLONASE) 50 MCG/ACT nasal spray Place 2 sprays into both nostrils daily. (Patient taking differently: Place 2 sprays into both nostrils daily as needed. ) 16 g 2    Results for orders placed or performed during the hospital encounter of 10/19/19 (from the past 48 hour(s))  Glucose, capillary     Status: None   Collection Time: 10/19/19  5:57 AM  Result Value Ref Range   Glucose-Capillary 91 70 - 99 mg/dL   No results found.  Review of Systems  All other systems reviewed and are negative.   Blood pressure (!) 154/90, pulse 67, temperature 98.7 F (37.1 C), temperature source Oral, resp. rate 13,  weight 87.3 kg, SpO2 96 %. Physical Exam  Constitutional: She is oriented to person, place, and time. She appears well-developed and well-nourished.  HENT:  Head: Atraumatic.  Eyes: EOM are normal.  Cardiovascular: Intact distal pulses.  Respiratory: Effort normal.  Musculoskeletal:     Comments: L shoulder pain with limited ROM> NVID  Neurological: She is alert and oriented to person, place, and time.  Skin: Skin is warm and dry.  Psychiatric: She has a normal mood and affect.     Assessment/Plan L shoulder endstage osteoarthritis, failed conservative mgmt Plan L TSA Risks / benefits of surgery discussed Consent on chart  NPO for OR Preop antibiotics   Isabella Stalling, MD 10/19/2019, 7:25 AM

## 2019-10-19 NOTE — Anesthesia Preprocedure Evaluation (Addendum)
Anesthesia Evaluation  Patient identified by MRN, date of birth, ID band Patient awake    Reviewed: Allergy & Precautions, NPO status , Patient's Chart, lab work & pertinent test results  Airway Mallampati: II  TM Distance: >3 FB Neck ROM: Full    Dental  (+) Teeth Intact, Dental Advisory Given   Pulmonary asthma ,    breath sounds clear to auscultation       Cardiovascular hypertension, Pt. on medications  Rhythm:Regular Rate:Normal     Neuro/Psych  Neuromuscular disease negative psych ROS   GI/Hepatic negative GI ROS, Neg liver ROS,   Endo/Other  diabetes  Renal/GU negative Renal ROS     Musculoskeletal  (+) Arthritis , Fibromyalgia -  Abdominal Normal abdominal exam  (+)   Peds  Hematology negative hematology ROS (+)   Anesthesia Other Findings   Reproductive/Obstetrics                            Anesthesia Physical Anesthesia Plan  ASA: II  Anesthesia Plan: General   Post-op Pain Management: GA combined w/ Regional for post-op pain   Induction: Intravenous  PONV Risk Score and Plan: 4 or greater and Ondansetron, Dexamethasone, Midazolam and Treatment may vary due to age or medical condition  Airway Management Planned: Oral ETT  Additional Equipment: None  Intra-op Plan:   Post-operative Plan: Extubation in OR  Informed Consent: I have reviewed the patients History and Physical, chart, labs and discussed the procedure including the risks, benefits and alternatives for the proposed anesthesia with the patient or authorized representative who has indicated his/her understanding and acceptance.       Plan Discussed with: CRNA  Anesthesia Plan Comments:        Anesthesia Quick Evaluation

## 2019-10-19 NOTE — Evaluation (Signed)
Occupational Therapy Evaluation Patient Details Name: Carol Gilbert MRN: [id number] DOB: 05-May-1951 Today's Date: 10/19/2019    History of Present Illness 69 y o female s/p L TSA   Clinical Impression   Pt was seen in PACU after the above sx.  Son present for education.  Pt will follow up with Dr Ave Filter for further rehab needs    Follow Up Recommendations  Supervision/Assistance - 24 hour;Follow surgeon's recommendation for DC plan and follow-up therapies    Equipment Recommendations  None recommended by OT    Recommendations for Other Services       Precautions / Restrictions Precautions Precautions: Shoulder Type of Shoulder Precautions: sling off for bathing/dressing. May perform PROM for FF up to 90 and ER to neutral. NO ABD, NO PENDULUMs, may move elbow wrist and hand Shoulder Interventions: Shoulder sling/immobilizer Precaution Booklet Issued: Yes (comment) Restrictions Other Position/Activity Restrictions: LUE NWB      Mobility Bed Mobility                  Transfers                 General transfer comment: min guard for safety    Balance                                           ADL either performed or assessed with clinical judgement   ADL Overall ADL's : Needs assistance/impaired Eating/Feeding: Set up   Grooming: Set up   Upper Body Bathing: Moderate assistance   Lower Body Bathing: Moderate assistance   Upper Body Dressing : Maximal assistance   Lower Body Dressing: Maximal assistance   Toilet Transfer: Minimal assistance;Ambulation;Comfort height toilet   Toileting- Clothing Manipulation and Hygiene: Min guard;Sit to/from stand         General ADL Comments: performed PROM, dressing and toileting.  Ambulated to bathroom with min to min guard assist. Educated on Geographical information systems officer     Praxis      Pertinent Vitals/Pain Pain Assessment: No/denies pain(block in  effect)     Hand Dominance Right   Extremity/Trunk Assessment Upper Extremity Assessment Upper Extremity Assessment: LUE deficits/detail LUE Deficits / Details: unable to move at this time due to block           Communication Communication Communication: No difficulties   Cognition Arousal/Alertness: Awake/alert Behavior During Therapy: WFL for tasks assessed/performed Overall Cognitive Status: Within Functional Limits for tasks assessed                                     General Comments       Exercises     Shoulder Instructions      Home Living Family/patient expects to be discharged to:: Private residence Living Arrangements: Other relatives           Home Layout: One level     Bathroom Shower/Tub: Producer, television/film/video: Standard(surface to push up from on L)     Home Equipment: Walker - 2 wheels;Shower seat          Prior Functioning/Environment Level of Independence: Independent        Comments: son staying a couple of days, then daughter  OT Problem List:        OT Treatment/Interventions:      OT Goals(Current goals can be found in the care plan section) Acute Rehab OT Goals OT Goal Formulation: All assessment and education complete, DC therapy  OT Frequency:     Barriers to D/C:            Co-evaluation              AM-PAC OT "6 Clicks" Daily Activity     Outcome Measure Help from another person eating meals?: A Little Help from another person taking care of personal grooming?: A Little Help from another person toileting, which includes using toliet, bedpan, or urinal?: A Little Help from another person bathing (including washing, rinsing, drying)?: A Lot Help from another person to put on and taking off regular upper body clothing?: A Lot Help from another person to put on and taking off regular lower body clothing?: A Lot 6 Click Score: 15   End of Session    Activity Tolerance:  Patient tolerated treatment well Patient left: in chair;with call bell/phone within reach;with family/visitor present  OT Visit Diagnosis: Muscle weakness (generalized) (M62.81)                Time: 2423-5361 OT Time Calculation (min): 44 min Charges:  OT General Charges $OT Visit: 1 Visit OT Evaluation $OT Eval Low Complexity: 1 Low OT Treatments $Self Care/Home Management : 8-22 mins $Therapeutic Exercise: 8-22 mins  Brinden Kincheloe S, OTR/L Acute Rehabilitation Services 10/19/2019  [place] 10/19/2019, 12:22 PM

## 2019-10-19 NOTE — Anesthesia Procedure Notes (Signed)
Anesthesia Regional Block: Interscalene brachial plexus block   Pre-Anesthetic Checklist: ,, timeout performed, Correct Patient, Correct Site, Correct Laterality, Correct Procedure, Correct Position, site marked, Risks and benefits discussed,  Surgical consent,  Pre-op evaluation,  At surgeon's request and post-op pain management  Laterality: Left  Prep: chloraprep       Needles:  Injection technique: Single-shot  Needle Type: Echogenic Stimulator Needle     Needle Length: 9cm  Needle Gauge: 21     Additional Needles:   Procedures:,,,, ultrasound used (permanent image in chart),,,,  Narrative:  Start time: 10/19/2019 7:15 AM End time: 10/19/2019 7:25 AM Injection made incrementally with aspirations every 5 mL.  Performed by: Personally  Anesthesiologist: Shelton Silvas, MD  Additional Notes: Patient tolerated the procedure well. Local anesthetic introduced in an incremental fashion under minimal resistance after negative aspirations. No paresthesias were elicited. After completion of the procedure, no acute issues were identified and patient continued to be monitored by RN.

## 2019-10-19 NOTE — Transfer of Care (Signed)
Immediate Anesthesia Transfer of Care Note  Patient: Carol Gilbert  Procedure(s) Performed: TOTAL SHOULDER ARTHROPLASTY (Left Shoulder)  Patient Location: PACU  Anesthesia Type:General  Level of Consciousness: awake, alert , oriented and patient cooperative  Airway & Oxygen Therapy: Patient Spontanous Breathing and Patient connected to face mask oxygen  Post-op Assessment: Report given to RN, Post -op Vital signs reviewed and stable and Patient moving all extremities  Post vital signs: Reviewed and stable  Last Vitals:  Vitals Value Taken Time  BP 161/89 10/19/19 0912  Temp    Pulse 87 10/19/19 0913  Resp 26 10/19/19 0913  SpO2 100 % 10/19/19 0913  Vitals shown include unvalidated device data.  Last Pain:  Vitals:   10/19/19 0622  TempSrc:   PainSc: 0-No pain         Complications: No apparent anesthesia complications

## 2019-10-19 NOTE — Anesthesia Procedure Notes (Signed)
Procedure Name: Intubation Date/Time: 10/19/2019 7:40 AM Performed by: Victoriano Lain, CRNA Pre-anesthesia Checklist: Patient identified, Emergency Drugs available, Suction available, Patient being monitored and Timeout performed Patient Re-evaluated:Patient Re-evaluated prior to induction Oxygen Delivery Method: Circle system utilized Preoxygenation: Pre-oxygenation with 100% oxygen Induction Type: IV induction Ventilation: Mask ventilation without difficulty Laryngoscope Size: Mac and 4 Grade View: Grade I Tube type: Oral Tube size: 7.5 mm Number of attempts: 1 Airway Equipment and Method: Stylet Placement Confirmation: ETT inserted through vocal cords under direct vision,  positive ETCO2 and breath sounds checked- equal and bilateral Secured at: 21 cm Tube secured with: Tape Dental Injury: Teeth and Oropharynx as per pre-operative assessment

## 2019-10-19 NOTE — Anesthesia Postprocedure Evaluation (Signed)
Anesthesia Post Note  Patient: Zenaya R Schmidt-Soltero  Procedure(s) Performed: TOTAL SHOULDER ARTHROPLASTY (Left Shoulder)     Patient location during evaluation: PACU Anesthesia Type: General Level of consciousness: awake and alert Pain management: pain level controlled Vital Signs Assessment: post-procedure vital signs reviewed and stable Respiratory status: spontaneous breathing, nonlabored ventilation, respiratory function stable and patient connected to nasal cannula oxygen Cardiovascular status: blood pressure returned to baseline and stable Postop Assessment: no apparent nausea or vomiting Anesthetic complications: no    Last Vitals:  Vitals:   10/19/19 0945 10/19/19 1000  BP: (!) 146/128 (!) 155/85  Pulse: 90 83  Resp: 14 17  Temp:  36.4 C  SpO2: 93% 95%    Last Pain:  Vitals:   10/19/19 1000  TempSrc:   PainSc: 0-No pain                 Shelton Silvas

## 2019-10-19 NOTE — Op Note (Signed)
Procedure(s): TOTAL SHOULDER ARTHROPLASTY Procedure Note  Carol Gilbert female 69 y.o. 10/19/2019  Preoperative diagnosis: Left shoulder end-stage osteoarthritis  Postoperative diagnosis: Same  Procedure(s) and Anesthesia Type:   LEFT TOTAL SHOULDER ARTHROPLASTY - General  Surgeon(s) and Role:    Jones Broom, MD - Primary   Indications:  69 y.o. female  With endstage left shoulder arthritis. Pain and dysfunction interfered with quality of life and nonoperative treatment with activity modification, NSAIDS and injections failed.     Surgeon: Berline Lopes   Assistants: Damita Lack PA-C [facility] was present and scrubbed throughout the procedure and was essential in positioning, retraction, exposure, and closure)  Anesthesia: General endotracheal anesthesia with preoperative interscalene block given by the attending anesthesiologist     Procedure Detail  TOTAL SHOULDER ARTHROPLASTY  Findings: Tornier flex anatomic press-fit size 1 stem with a 46 head, cemented size 40 small Cortiloc glenoid.   A lesser tuberosity osteotomy was performed and repaired at the conclusion of the procedure.  Estimated Blood Loss:  200 mL         Drains: None   Blood Given: none          Specimens: none        Complications:  * No complications entered in OR log *         Disposition: PACU - hemodynamically stable.         Condition: stable    Procedure:   The patient was identified in the preoperative holding area where I personally marked the operative extremity after verifying with the patient and consent. She  was taken to the operating room where She was transferred to the   operative table.  The patient received an interscalene block in   the holding area by the attending anesthesiologist.  General anesthesia was induced   in the operating room without complication.  The patient did receive IV  Ancef prior to the commencement of the procedure.  The  patient was   placed in the beach-chair position with the back raised about 30   degrees.  The nonoperative extremity and head and neck were carefully   positioned and padded protecting against neurovascular compromise.  The   left upper extremity was then prepped and draped in the standard sterile   fashion.    The appropriate operative time-out was performed with   Anesthesia, the perioperative staff, as well as myself and we all agreed   that the left side was the correct operative site.  An approximately   10 cm incision was made from the tip of the coracoid to the center point of the   humerus at the level of the axilla.  Dissection was carried down sharply   through subcutaneous tissues and cephalic vein was identified and taken   laterally with the deltoid.  The pectoralis major was taken medially.  The   upper 1 cm of the pectoralis major was released from its attachment on   the humerus.  The clavipectoral fascia was incised just lateral to the   conjoined tendon.  This incision was carried up to but not into the   coracoacromial ligament.  Digital palpation was used to prove   integrity of the axillary nerve which was protected throughout the   procedure.  Musculocutaneous nerve was not palpated in the operative   field.  Conjoined tendon was then retracted gently medially and the   deltoid laterally.  Anterior circumflex humeral vessels were clamped and  coagulated.  The soft tissues overlying the biceps was incised and this   incision was carried across the transverse humeral ligament to the base   of the coracoid.  The biceps was noted to be severely degenerated. It was released from the superior labrum. The biceps was then tenodesed to the soft tissue just above   pectoralis major and the remaining portion of the biceps superiorly was   excised.  An osteotomy was performed at the lesser tuberosity.  The capsule was then   released all the way down to the 6 o'clock position of  the humeral head.   The humeral head was then delivered with simultaneous adduction,   extension and external rotation.  All humeral osteophytes were removed   and the anatomic neck of the humerus was marked and cut free hand at   approximately 25 degrees retroversion within about 3 mm of the cuff   reflection posteriorly.  The head size was estimated to be a 46 medium   offset.  At that point, the humeral head was retracted posteriorly with   a Fukuda retractor.   Remaining portion of the capsule was released at the base of the   coracoid.  The remaining biceps anchor and the entire anterior-inferior   labrum was excised.  The posterior labrum was also excised but the   posterior capsule was not released.  The guidepin was placed bicortically with none elevated guide.  The reamer was used to ream to concentric bone with punctate bleeding.  This gave an excellent concentric surface.  The center hole was then drilled for an anchor peg glenoid followed by the three peripheral holes and the anterior inferior hole exited the glenoid wall.  I then pulse irrigated these holes and dried   them with Surgicel.  The three peripheral holes were then   pressurized cemented and the anchor peg glenoid was placed and impacted   with an excellent fit.  The glenoid was a 40 small component.  The proximal humerus was then again exposed taking care not to displace the glenoid.    The entry awl was used followed by sounding reamers and then broached for a size 1. This was then left in place and the calcar planer was used. Trial head was placed with a 46.  With the trial implantation of the component, there was approximately 50% posterior translation with immediate snap back to the   anatomic position.  With forward elevation, there was no tendency   towards posterior subluxation.   The trial was removed and the final implant was prepared on a back table.  The trial was removed and the final implant was prepared on a  back table.   3 small holes were drilled on the medial side of the lesser tuberosity osteotomy, through which 2 labral tapes were passed. The implant was then placed through the loop of the 2 labral tapes and impacted with an excellent press-fit. This achieved excellent anatomic reconstruction of the proximal humerus.  The joint was then copiously irrigated with pulse lavage.  The subscapularis and   lesser tuberosity osteotomy were then repaired using the 2 labral tapes previously passed in a double row fashion with horizontal mattress sutures medially brought over through bone tunnels tied over a bone bridge laterally.   One #1 Ethibond was placed at the rotator interval just above   the lesser tuberosity. Copious irrigation was used. Skin was closed with 2-0 Vicryl sutures in the deep dermal layer and  4-0 Monocryl in a subcuticular  running fashion.  Sterile dressings were then applied including Aquacel.  The patient was placed in a sling and allowed to awaken from general anesthesia and taken to the recovery room in stable condition.      POSTOPERATIVE PLAN:  Early passive range of motion will be allowed with the goal of 0 degrees external rotation and 90 degrees forward elevation.  No internal rotation at this time.  No active motion of the arm until the lesser tuberosity heals.  The patient will be observed in the recovery room and if she has a good regional block and is doing well physiologically she can be discharged home today with her son.

## 2019-10-20 ENCOUNTER — Encounter: Payer: Self-pay | Admitting: *Deleted

## 2019-11-05 ENCOUNTER — Ambulatory Visit: Payer: Medicare Other | Attending: Internal Medicine

## 2019-11-05 DIAGNOSIS — Z23 Encounter for immunization: Secondary | ICD-10-CM | POA: Insufficient documentation

## 2019-11-05 NOTE — Progress Notes (Signed)
   Covid-19 Vaccination Clinic  Name:  Carol Gilbert    MRN: [id number] DOB: Dec 31, 1950  11/05/2019  [name] was observed post Covid-19 immunization for 15 minutes without incident. She was provided with Vaccine Information Sheet and instruction to access the V-Safe system.   [name] was instructed to call 911 with any severe reactions post vaccine: Marland Kitchen Difficulty breathing  . Swelling of face and throat  . A fast heartbeat  . A bad rash all over body  . Dizziness and weakness   Immunizations Administered    Name Date Dose VIS Date Route   Pfizer COVID-19 Vaccine 11/05/2019 10:05 AM 0.3 mL 08/11/2019 Intramuscular   Manufacturer: [employer], [employer]   Lot: [id number]   NDC: [address]-3

## 2019-11-23 ENCOUNTER — Encounter: Payer: Self-pay | Admitting: Adult Health

## 2019-11-23 ENCOUNTER — Ambulatory Visit (INDEPENDENT_AMBULATORY_CARE_PROVIDER_SITE_OTHER): Payer: Medicare Other | Admitting: Adult Health

## 2019-11-23 ENCOUNTER — Other Ambulatory Visit: Payer: Self-pay

## 2019-11-23 DIAGNOSIS — G4733 Obstructive sleep apnea (adult) (pediatric): Secondary | ICD-10-CM | POA: Diagnosis not present

## 2019-11-23 DIAGNOSIS — R918 Other nonspecific abnormal finding of lung field: Secondary | ICD-10-CM | POA: Diagnosis not present

## 2019-11-23 DIAGNOSIS — J45909 Unspecified asthma, uncomplicated: Secondary | ICD-10-CM | POA: Diagnosis not present

## 2019-11-23 DIAGNOSIS — J310 Chronic rhinitis: Secondary | ICD-10-CM | POA: Diagnosis not present

## 2019-11-23 NOTE — Assessment & Plan Note (Signed)
Currently under good control.  Continue present regimen

## 2019-11-23 NOTE — Assessment & Plan Note (Signed)
Under control.  Continue present regimen.  Trigger prevention.

## 2019-11-23 NOTE — Assessment & Plan Note (Signed)
Continue on CPAP.  Doing well with good compliance and control.  Plan  Patient Instructions  Continue on Symbicort 2 puffs Twice daily , rinse after use.  Continue on Singulair 10mg  At bedtime  .  Continue on Flonase 2 puffs daily As needed   Albuterol inhaler As needed  Wheezing .  Claritin 10mg  daily .  Covid vaccine as planned.  Activity as tolerated.  CT chest as planned this year.  Continue on CPAP At bedtime   Work on healthy weight.  Follow up in 6 months with Dr. or Carston Riedl NP and As needed .  Please contact office for sooner follow up if symptoms do not improve or worsen or seek emergency care

## 2019-11-23 NOTE — Assessment & Plan Note (Signed)
Stable on CT chest.  Follow-up CT chest later this year as planned

## 2019-11-23 NOTE — Patient Instructions (Addendum)
Continue on Symbicort 2 puffs Twice daily , rinse after use.  Continue on Singulair 10mg  At bedtime  .  Continue on Flonase 2 puffs daily As needed   Albuterol inhaler As needed  Wheezing .  Claritin 10mg  daily .  Covid vaccine as planned.  Activity as tolerated.  CT chest as planned this year.  Continue on CPAP At bedtime   Work on healthy weight.  Follow up in 6 months with Dr. or Danice Dippolito NP and As needed .  Please contact office for sooner follow up if symptoms do not improve or worsen or seek emergency care

## 2019-11-23 NOTE — Progress Notes (Signed)
@Patient  ID: , female    DOB: [birth year], 69 y.o.   MRN: [id number]  Chief Complaint  Patient presents with  . Follow-up    OSA     Referring provider: No ref. provider found  HPI: 69 year old never smoker followed for moderate persistent asthma, chronic rhinitis and Mild obstructive sleep apnea on nocturnal CPAP Morbid obesity status post bariatric surgery 2019 From 2020.   TEST/EVENTS :  FENO 08/2018 -10 ppb  IgE 09/19/18  -76 Eosinophils 100  HST 10/25/18- AHI 6.8, SpO2 low 87%  10/24/2018 high-res CT chest no ILD, 7 mm right lower lobe nodule.  Left adrenal adenoma, ectatic 4 cm ascending thoracic aorta  11/23/2019 Follow up : Asthma, CR , OSA  Patient returns for a 68-month follow-up.  Patient has underlying asthma.  She remains on Symbicort and Singulair.  She says overall breathing is doing about the same.  She does get winded with heavy activity.  She denies any flare of cough or wheezing.  She has had an increase in albuterol use. She says she has noticed some increased nasal drainage and postnasal drip since flowers and trees are blooming out.  She is taking Claritin and Flonase daily.  She is also on Singulair daily. She has received her first Covid vaccine.  Plans for her second 1 is scheduled.  Patient has known lung nodules on CT.  She has a follow-up CT later this year for follow-up.  Last CT showed stability in her lung nodule.    CPAP download shows excellent compliance with daily averages around 7 to 8 hours.  AHI 3.8.  Patient says she feels rested and feels that she benefits from CPAP.  Allergies  Allergen Reactions  . Shellfish Allergy Anaphylaxis  . Pollen Extract Other (See Comments)    Dust and smoke/ causes watery eyes; sneezing    Immunization History  Administered Date(s) Administered  . Fluad Quad(high Dose 68+) 05/22/2019  . Influenza, High Dose Seasonal PF 68/01/2018  . Influenza, Seasonal, Injecte,  Preservative Fre 05/18/2017  . PFIZER SARS-COV-2 Vaccination 11/05/2019  . Pneumococcal Conjugate-13 08/25/2018  . Pneumococcal Polysaccharide-23 08/02/2017  . Td 03/07/2019  . Zoster Recombinat (Shingrix) 03/05/2019, 05/06/2019    Past Medical History:  Diagnosis Date  . Apnea   . Arthritis   . Asthma   . Diabetes mellitus without complication (HCC)    no medications  . Diverticulitis   . Fibromyalgia   . Hypertension     Tobacco History: Social History   Tobacco Use  Smoking Status Never Smoker  Smokeless Tobacco Never Used   Counseling given: Not Answered   Outpatient Medications Prior to Visit  Medication Sig Dispense Refill  . albuterol (PROVENTIL HFA;VENTOLIN HFA) 108 (90 Base) MCG/ACT inhaler Inhale 2 puffs into the lungs every 6 (six) hours as needed for wheezing or shortness of breath. 1 Inhaler 6  . Ascorbic Acid (VITAMIN C) 1000 MG tablet Take 1,000 mg by mouth daily.    07/06/2019 aspirin EC 81 MG tablet Take 1 tablet (81 mg total) by mouth 2 (two) times daily. (Patient taking differently: Take 81 mg by mouth daily. ) 60 tablet 0  . b complex vitamins tablet Take 1 tablet by mouth every evening.     . Biotin 5000 MCG TABS Take 10,000 mcg by mouth every evening.     . bisacodyl (DULCOLAX) 5 MG EC tablet Take 5 mg by mouth daily.     . Calcium-Phosphorus-Vitamin D (CITRACAL +D3 PO)  Take 1 tablet by mouth every evening.    . cetirizine (KLS ALLER-TEC) 10 MG tablet Take 10 mg by mouth daily.    . Cholecalciferol (VITAMIN D3) 125 MCG (5000 UT) CAPS Take 5,000 Units by mouth daily.     Marland Kitchen docusate sodium (COLACE) 100 MG capsule Take 300 mg by mouth at bedtime.    . DULoxetine (CYMBALTA) 60 MG capsule Take 60 mg by mouth daily.    . fluticasone (FLONASE) 50 MCG/ACT nasal spray Place 2 sprays into both nostrils daily. (Patient taking differently: Place 2 sprays into both nostrils daily as needed. ) 16 g 2  . levalbuterol (XOPENEX HFA) 45 MCG/ACT inhaler Inhale 1-2 puffs into the  lungs every 4 (four) hours as needed for wheezing.    Marland Kitchen losartan-hydrochlorothiazide (HYZAAR) 50-12.5 MG tablet Take 1 tablet by mouth daily.    . Methylcellulose, Laxative, (CITRUCEL) 500 MG TABS Take 500 mg by mouth 2 (two) times a day.     . montelukast (SINGULAIR) 10 MG tablet Take 10 mg by mouth daily.     Marland Kitchen MUCINEX MAXIMUM STRENGTH 1200 MG TB12 Take 1,200 mg by mouth every evening. Takes every other day in the evening    . Multiple Vitamin (MULTIVITAMIN WITH MINERALS) TABS tablet Take 1 tablet by mouth daily. One-A-Day Women's    . saccharomyces boulardii (FLORASTOR) 250 MG capsule Take 250 mg by mouth daily.     . SYMBICORT 160-4.5 MCG/ACT inhaler Inhale 2 puffs into the lungs 2 (two) times daily. 1 Inhaler 5  . oxyCODONE-acetaminophen (PERCOCET) 5-325 MG tablet Take 1-2 tablets every 4 hours as needed for post operative pain. MAX 6/day 30 tablet 0  . tiZANidine (ZANAFLEX) 4 MG tablet Take 1 tablet (4 mg total) by mouth every 8 (eight) hours as needed for muscle spasms. 30 tablet 1   No facility-administered medications prior to visit.     Review of Systems:   Constitutional:   No  weight loss, night sweats,  Fevers, chills, + fatigue, or  lassitude.  HEENT:   No headaches,  Difficulty swallowing,  Tooth/dental problems, or  Sore throat,                No sneezing, itching, ear ache, nasal congestion, post nasal drip,   CV:  No chest pain,  Orthopnea, PND, swelling in lower extremities, anasarca, dizziness, palpitations, syncope.   GI  No heartburn, indigestion, abdominal pain, nausea, vomiting, diarrhea, change in bowel habits, loss of appetite, bloody stools.   Resp:  .  No chest wall deformity  Skin: no rash or lesions.  GU: no dysuria, change in color of urine, no urgency or frequency.  No flank pain, no hematuria   MS:  No joint pain or swelling.  No decreased range of motion.  No back pain.    Physical Exam  BP (!) 142/78 (BP Location: Left Arm, Cuff Size: Normal)    Pulse 74   Temp (!) 97.5 F (36.4 C) (Temporal)   Ht 5\' 1"  (1.549 m)   Wt 195 lb (88.5 kg)   SpO2 97% Comment: RA  BMI 36.84 kg/m   GEN: A/Ox3; pleasant , NAD, bmI 36    HEENT:  [place]/AT,  EACs-clear, TMs-wnl, NOSE-clear, THROAT-clear, no lesions, no postnasal drip or exudate noted.   NECK:  Supple w/ fair ROM; no JVD; normal carotid impulses w/o bruits; no thyromegaly or nodules palpated; no lymphadenopathy.    RESP  Clear  P & A; w/o, wheezes/ rales/ or  rhonchi. no accessory muscle use, no dullness to percussion  CARD:  RRR, no m/r/g, no peripheral edema, pulses intact, no cyanosis or clubbing.  GI:   Soft & nt; nml bowel sounds; no organomegaly or masses detected.   Musco: Warm bil, no deformities or joint swelling noted.   Neuro: alert, no focal deficits noted.    Skin: Warm, no lesions or rashes    Lab Results:  CBC    Component Value Date/Time   WBC 8.4 10/13/2019 1412   RBC 4.44 10/13/2019 1412   HGB 13.0 10/13/2019 1412   HCT 41.3 10/13/2019 1412   PLT 259 10/13/2019 1412   MCV 93.0 10/13/2019 1412   MCH 29.3 10/13/2019 1412   MCHC 31.5 10/13/2019 1412   RDW 13.6 10/13/2019 1412   LYMPHSABS 2.2 10/13/2019 1412   MONOABS 0.4 10/13/2019 1412   EOSABS 0.1 10/13/2019 1412   BASOSABS 0.1 10/13/2019 1412    BMET    Component Value Date/Time   NA 141 10/13/2019 1412   K 3.6 10/13/2019 1412   CL 103 10/13/2019 1412   CO2 31 10/13/2019 1412   GLUCOSE 136 (H) 10/13/2019 1412   BUN 22 10/13/2019 1412   CREATININE 0.55 10/13/2019 1412   CALCIUM 10.0 10/13/2019 1412   GFRNONAA >60 10/13/2019 1412   GFRAA >60 10/13/2019 1412    BNP No results found for: BNP  ProBNP No results found for: PROBNP  Imaging: No results found.    PFT Results Latest Ref Rng & Units 11/01/2018  FVC-Pre L 2.36  FVC-Predicted Pre % 85  FVC-Post L 2.38  FVC-Predicted Post % 85  Pre FEV1/FVC % % 73  Post FEV1/FCV % % 70  FEV1-Pre L 1.72  FEV1-Predicted Pre % 81    FEV1-Post L 1.67  DLCO UNC% % 140  DLCO COR %Predicted % 135    Lab Results  Component Value Date   NITRICOXIDE 10 09/26/2018        Assessment & Plan:   Asthma Currently under good control.  Continue present regimen  Chronic rhinitis Under control.  Continue present regimen.  Trigger prevention.  Lung nodules Stable on CT chest.  Follow-up CT chest later this year as planned  OSA (obstructive sleep apnea) Continue on CPAP.  Doing well with good compliance and control.  Plan  Patient Instructions  Continue on Symbicort 2 puffs Twice daily , rinse after use.  Continue on Singulair 10mg  At bedtime  .  Continue on Flonase 2 puffs daily As needed   Albuterol inhaler As needed  Wheezing .  Claritin 10mg  daily .  Covid vaccine as planned.  Activity as tolerated.  CT chest as planned this year.  Continue on CPAP At bedtime   Work on healthy weight.  Follow up in 6 months with Dr. or Londan Coplen NP and As needed .  Please contact office for sooner follow up if symptoms do not improve or worsen or seek emergency care            , NP 11/23/2019

## 2019-12-05 ENCOUNTER — Ambulatory Visit: Payer: Medicare Other | Attending: Internal Medicine

## 2019-12-05 DIAGNOSIS — Z23 Encounter for immunization: Secondary | ICD-10-CM

## 2019-12-05 NOTE — Progress Notes (Signed)
   Covid-19 Vaccination Clinic  Name:  SOLYANA NONAKA    MRN: [id number] DOB: 08-Sep-1950  12/05/2019  [name] was observed post Covid-19 immunization for 15 minutes without incident. She was provided with Vaccine Information Sheet and instruction to access the V-Safe system.   Ms. Vazques was instructed to call 911 with any severe reactions post vaccine: Marland Kitchen Difficulty breathing  . Swelling of face and throat  . A fast heartbeat  . A bad rash all over body  . Dizziness and weakness   Immunizations Administered    Name Date Dose VIS Date Route   Pfizer COVID-19 Vaccine 12/05/2019 11:04 AM 0.3 mL 08/11/2019 Intramuscular   Manufacturer: [employer], [employer]   Lot: [id number]   NDC: [address]-4

## 2020-05-20 ENCOUNTER — Ambulatory Visit
Admission: RE | Admit: 2020-05-20 | Discharge: 2020-05-20 | Disposition: A | Discharge status: Home / Self Care | Payer: Medicare Other | Source: Ambulatory Visit | Attending: Adult Health | Admitting: Adult Health

## 2020-05-20 DIAGNOSIS — R911 Solitary pulmonary nodule: Secondary | ICD-10-CM

## 2020-05-27 ENCOUNTER — Ambulatory Visit (INDEPENDENT_AMBULATORY_CARE_PROVIDER_SITE_OTHER): Payer: Medicare Other | Admitting: Adult Health

## 2020-05-27 ENCOUNTER — Other Ambulatory Visit: Payer: Self-pay

## 2020-05-27 ENCOUNTER — Encounter: Payer: Self-pay | Admitting: Adult Health

## 2020-05-27 DIAGNOSIS — G4733 Obstructive sleep apnea (adult) (pediatric): Secondary | ICD-10-CM | POA: Diagnosis not present

## 2020-05-27 DIAGNOSIS — J310 Chronic rhinitis: Secondary | ICD-10-CM | POA: Diagnosis not present

## 2020-05-27 DIAGNOSIS — J4540 Moderate persistent asthma, uncomplicated: Secondary | ICD-10-CM | POA: Diagnosis not present

## 2020-05-27 DIAGNOSIS — R918 Other nonspecific abnormal finding of lung field: Secondary | ICD-10-CM | POA: Diagnosis not present

## 2020-05-27 NOTE — Assessment & Plan Note (Signed)
Mild to moderate persistent asthma currently well controlled.  Patient is continue on trigger prevention.  Plan  Patient Instructions  Continue on Symbicort 2 puffs Twice daily , rinse after use.  Continue on Singulair 10mg  At bedtime  .  Continue on Flonase 2 puffs daily As needed   Albuterol inhaler As needed  Wheezing .  Claritin 10mg  daily .  Activity as tolerated.  Continue on CPAP At bedtime   Work on healthy weight.  Follow up in 6 months with Dr. or Antuan Limes NP and As needed .  Please contact office for sooner follow up if symptoms do not improve or worsen or seek emergency care

## 2020-05-27 NOTE — Assessment & Plan Note (Signed)
Pulmonary nodules noted on CT scan are stable to decreased in size compatible with a benign etiology.  No further imaging at this time is indicated

## 2020-05-27 NOTE — Assessment & Plan Note (Signed)
Excellent control and compliance on nocturnal CPAP  Plan  Patient Instructions  Continue on Symbicort 2 puffs Twice daily , rinse after use.  Continue on Singulair 10mg  At bedtime  .  Continue on Flonase 2 puffs daily As needed   Albuterol inhaler As needed  Wheezing .  Claritin 10mg  daily .  Activity as tolerated.  Continue on CPAP At bedtime   Work on healthy weight.  Follow up in 6 months with Dr. or Dorsie Burich NP and As needed .  Please contact office for sooner follow up if symptoms do not improve or worsen or seek emergency care

## 2020-05-27 NOTE — Assessment & Plan Note (Addendum)
Controlled on current regimen no changes 

## 2020-05-27 NOTE — Patient Instructions (Signed)
Continue on Symbicort 2 puffs Twice daily , rinse after use.  Continue on Singulair 10mg  At bedtime  .  Continue on Flonase 2 puffs daily As needed   Albuterol inhaler As needed  Wheezing .  Claritin 10mg  daily .  Activity as tolerated.  Continue on CPAP At bedtime   Work on healthy weight.  Follow up in 6 months with Dr. or Ordell Prichett NP and As needed .  Please contact office for sooner follow up if symptoms do not improve or worsen or seek emergency care

## 2020-05-27 NOTE — Progress Notes (Signed)
@Patient  ID: , female    DOB: [birth year], 69 y.o.   MRN: [id number]  Chief Complaint  Patient presents with  . Follow-up    OSA     Referring provider: [id number], FNP  HPI: 69 year old female never smoker followed for moderate persistent asthma, chronic rhinitis and obstructive sleep apnea, and pulmonary nodules  Morbid obesity status post bariatric surgery 2019 Patient is from 2020  TEST/EVENTS :  FENO 08/2018 -10 ppb IgE 09/19/18 -76 Eosinophils 100  HST 10/25/18- AHI 6.8, SpO2 low 87%  10/24/2018 high-res CT chest no ILD, 7 mm right lower lobe nodule. Left adrenal adenoma, ectatic 4 cm ascending thoracic aorta   05/27/2020 Follow up: Asthma , OSA , Lung nodules  Patient returns for a 84-month follow up.  Patient has had asthma.  She remains on Symbicort and Singulair.  She has several breathing is doing about the same.  She denies any flare of cough.  She does get some shortness of breath with heavy activities.  She is had no increased use of her rescue inhaler (albuterol).  She says she has noticed in some fall allergies with some nasal drainage.  She is on Claritin and Flonase and Singulair daily.  She has received both of her Covid vaccines.  She is planning on getting her booster Covid vaccine in 2 weeks.  Patient got her flu shot 2 weeks ago.  Patient has known lung nodules on CT scan.  Recent CT scan showed stable to decrease nodules consistent with a benign etiology.  We reviewed her CT notes.  Patient has underlying obstructive sleep apnea is on nocturnal CPAP.  Patient says she is doing well on CPAP she is having no issues.  Her CPAP download shows excellent compliance with daily average usage 8 hours.  She feels that she is rested with no significant daytime sickness.  AHI is 2.3.  Patient is on auto CPAP 7 to 9 cm of H2O.  Went to 8-month for 4 months due to sick mother. Very stressful time , gained some weight. Working on  weight loss.    Allergies  Allergen Reactions  . Shellfish Allergy Anaphylaxis  . Pollen Extract Other (See Comments)    Dust and smoke/ causes watery eyes; sneezing    Immunization History  Administered Date(s) Administered  . Fluad Quad(high Dose 65+) 05/22/2019  . Influenza, High Dose Seasonal PF 05/06/2018  . Influenza, Seasonal, Injecte, Preservative Fre 05/18/2017  . PFIZER SARS-COV-2 Vaccination 11/05/2019, 12/05/2019  . Pneumococcal Conjugate-13 08/25/2018  . Pneumococcal Polysaccharide-23 08/02/2017  . Td 03/07/2019  . Zoster Recombinat (Shingrix) 03/05/2019, 05/06/2019    Past Medical History:  Diagnosis Date  . Apnea   . Arthritis   . Asthma   . Diabetes mellitus without complication (HCC)    no medications  . Diverticulitis   . Fibromyalgia   . Hypertension     Tobacco History: Social History   Tobacco Use  Smoking Status Never Smoker  Smokeless Tobacco Never Used   Counseling given: Not Answered   Outpatient Medications Prior to Visit  Medication Sig Dispense Refill  . albuterol (PROVENTIL HFA;VENTOLIN HFA) 108 (90 Base) MCG/ACT inhaler Inhale 2 puffs into the lungs every 6 (six) hours as needed for wheezing or shortness of breath. 1 Inhaler 6  . Ascorbic Acid (VITAMIN C) 1000 MG tablet Take 1,000 mg by mouth daily.    07/06/2019 aspirin EC 81 MG tablet Take 1 tablet (81 mg total) by mouth  2 (two) times daily. (Patient taking differently: Take 81 mg by mouth daily. ) 60 tablet 0  . b complex vitamins tablet Take 1 tablet by mouth every evening.     . Biotin 5000 MCG TABS Take 10,000 mcg by mouth every evening.     . bisacodyl (DULCOLAX) 5 MG EC tablet Take 5 mg by mouth daily.     . Calcium-Phosphorus-Vitamin D (CITRACAL +D3 PO) Take 1 tablet by mouth every evening.    . cetirizine (KLS ALLER-TEC) 10 MG tablet Take 10 mg by mouth daily.    . Cholecalciferol (VITAMIN D3) 125 MCG (5000 UT) CAPS Take 5,000 Units by mouth daily.     Marland Kitchen docusate sodium (COLACE)  100 MG capsule Take 300 mg by mouth at bedtime.    . DULoxetine (CYMBALTA) 60 MG capsule Take 60 mg by mouth daily.    . fluticasone (FLONASE) 50 MCG/ACT nasal spray Place 2 sprays into both nostrils daily. (Patient taking differently: Place 2 sprays into both nostrils daily as needed. ) 16 g 2  . levalbuterol (XOPENEX HFA) 45 MCG/ACT inhaler Inhale 1-2 puffs into the lungs every 4 (four) hours as needed for wheezing.    Marland Kitchen losartan-hydrochlorothiazide (HYZAAR) 50-12.5 MG tablet Take 1 tablet by mouth daily.    . Methylcellulose, Laxative, (CITRUCEL) 500 MG TABS Take 500 mg by mouth 2 (two) times a day.     . montelukast (SINGULAIR) 10 MG tablet Take 10 mg by mouth daily.     Marland Kitchen MUCINEX MAXIMUM STRENGTH 1200 MG TB12 Take 1,200 mg by mouth every evening. Takes every other day in the evening    . Multiple Vitamin (MULTIVITAMIN WITH MINERALS) TABS tablet Take 1 tablet by mouth daily. One-A-Day Women's    . saccharomyces boulardii (FLORASTOR) 250 MG capsule Take 250 mg by mouth daily.     . SYMBICORT 160-4.5 MCG/ACT inhaler Inhale 2 puffs into the lungs 2 (two) times daily. 1 Inhaler 5   No facility-administered medications prior to visit.     Review of Systems:   Constitutional:   No  weight loss, night sweats,  Fevers, chills, fatigue, or  lassitude.  HEENT:   No headaches,  Difficulty swallowing,  Tooth/dental problems, or  Sore throat,                No sneezing, itching, ear ache, ' +nasal congestion, post nasal drip,   CV:  No chest pain,  Orthopnea, PND, swelling in lower extremities, anasarca, dizziness, palpitations, syncope.   GI  No heartburn, indigestion, abdominal pain, nausea, vomiting, diarrhea, change in bowel habits, loss of appetite, bloody stools.   Resp: No shortness of breath with exertion or at rest.  No excess mucus, no productive cough,  No non-productive cough,  No coughing up of blood.  No change in color of mucus.  No wheezing.  No chest wall deformity  Skin: no  rash or lesions.  GU: no dysuria, change in color of urine, no urgency or frequency.  No flank pain, no hematuria   MS:  No joint pain or swelling.  No decreased range of motion.  No back pain.    Physical Exam  BP 126/74 (BP Location: Left Arm, Cuff Size: Normal)   Pulse 83   Temp 98.5 F (36.9 C) (Temporal)   Ht 5\' 1"  (1.549 m)   Wt 222 lb 6.4 oz (100.9 kg)   SpO2 97% Comment: RA  BMI 42.02 kg/m   GEN: A/Ox3; pleasant , NAD, well  nourished    HEENT:  [place]/AT,   NOSE-clear, THROAT-clear, no lesions, no postnasal drip or exudate noted.  Class II-III MP airway  NECK:  Supple w/ fair ROM; no JVD; normal carotid impulses w/o bruits; no thyromegaly or nodules palpated; no lymphadenopathy.    RESP  Clear  P & A; w/o, wheezes/ rales/ or rhonchi. no accessory muscle use, no dullness to percussion  CARD:  RRR, no m/r/g, no peripheral edema, pulses intact, no cyanosis or clubbing.  GI:   Soft & nt; nml bowel sounds; no organomegaly or masses detected.   Musco: Warm bil, no deformities or joint swelling noted.   Neuro: alert, no focal deficits noted.    Skin: Warm, no lesions or rashes    Lab Results:  BNP No results found for: BNP  ProBNP No results found for: PROBNP  Imaging: CT Chest Wo Contrast  Result Date: 05/20/2020 CLINICAL DATA:  69 year old female with history of abnormal x-ray. Lung nodule. Follow-up study. EXAM: CT CHEST WITHOUT CONTRAST TECHNIQUE: Multidetector CT imaging of the chest was performed following the standard protocol without IV contrast. COMPARISON:  Chest CT 05/22/2019. FINDINGS: Cardiovascular: Heart size is normal. There is no significant pericardial fluid, thickening or pericardial calcification. There is aortic atherosclerosis, as well as atherosclerosis of the great vessels of the mediastinum and the coronary arteries, including calcified atherosclerotic plaque in the left anterior descending and right coronary arteries. Ectasia of ascending  thoracic aorta (4.3 cm in diameter). Mediastinum/Nodes: No pathologically enlarged mediastinal or hilar lymph nodes. Please note that accurate exclusion of hilar adenopathy is limited on noncontrast CT scans. Esophagus is unremarkable in appearance. No axillary lymphadenopathy. Lungs/Pleura: 2 mm left upper lobe nodule (axial image 20 of series 8), stable, considered definitively benign. Previously noted right lower lobe ground-glass attenuation nodule has decreased in size (axial image 85 of series 8), currently measuring only 5 mm. No other larger more suspicious appearing pulmonary nodules or masses are noted. Small calcified granuloma in the periphery of the right lower lobe. No acute consolidative airspace disease. No pleural effusions. Upper Abdomen: Postoperative changes of sleeve gastrectomy are noted. Status post cholecystectomy. Musculoskeletal: Status post left shoulder arthroplasty. There are no aggressive appearing lytic or blastic lesions noted in the visualized portions of the skeleton. IMPRESSION: 1. Previously noted pulmonary nodules are either stable or have decreased in size compared to prior studies, considered benign. 2. Aortic atherosclerosis, in addition to 2 vessel coronary artery disease. Please note that although the presence of coronary artery calcium documents the presence of coronary artery disease, the severity of this disease and any potential stenosis cannot be assessed on this non-gated CT examination. Assessment for potential risk factor modification, dietary therapy or pharmacologic therapy may be warranted, if clinically indicated. 3. Ectasia of ascending thoracic aorta (4.3 cm in diameter). Recommend annual imaging followup by CTA or MRA. This recommendation follows 2010 ACCF/AHA/AATS/ACR/ASA/SCA/SCAI/SIR/STS/SVM Guidelines for the Diagnosis and Management of Patients with Thoracic Aortic Disease. Circulation. 2010; 121: F093-A355. Aortic aneurysm NOS (ICD10-I71.9) Aortic  Atherosclerosis (ICD10-I70.0). Electronically Signed   By: Trudie Reed M.D.   On: 05/20/2020 09:40      PFT Results Latest Ref Rng & Units 11/01/2018  FVC-Pre L 2.36  FVC-Predicted Pre % 85  FVC-Post L 2.38  FVC-Predicted Post % 85  Pre FEV1/FVC % % 73  Post FEV1/FCV % % 70  FEV1-Pre L 1.72  FEV1-Predicted Pre % 81  FEV1-Post L 1.67  DLCO uncorrected ml/min/mmHg 24.98  DLCO UNC% % 140  DLCO corrected ml/min/mmHg 24.83  DLCO COR %Predicted % 139  DLVA Predicted % 135    Lab Results  Component Value Date   NITRICOXIDE 10 09/26/2018        Assessment & Plan:   Lung nodules Pulmonary nodules noted on CT scan are stable to decreased in size compatible with a benign etiology.  No further imaging at this time is indicated  Asthma Mild to moderate persistent asthma currently well controlled.  Patient is continue on trigger prevention.  Plan  Patient Instructions  Continue on Symbicort 2 puffs Twice daily , rinse after use.  Continue on Singulair 10mg  At bedtime  .  Continue on Flonase 2 puffs daily As needed   Albuterol inhaler As needed  Wheezing .  Claritin 10mg  daily .  Activity as tolerated.  Continue on CPAP At bedtime   Work on healthy weight.  Follow up in 6 months with Dr. Marchelle Gearingamaswamy or Foster Frericks NP and As needed .  Please contact office for sooner follow up if symptoms do not improve or worsen or seek emergency care         Chronic rhinitis Controlled on current regimen no changes  OSA (obstructive sleep apnea) Excellent control and compliance on nocturnal CPAP  Plan  Patient Instructions  Continue on Symbicort 2 puffs Twice daily , rinse after use.  Continue on Singulair 10mg  At bedtime  .  Continue on Flonase 2 puffs daily As needed   Albuterol inhaler As needed  Wheezing .  Claritin 10mg  daily .  Activity as tolerated.  Continue on CPAP At bedtime   Work on healthy weight.  Follow up in 6 months with Dr. Marchelle Gearingamaswamy or Zedric Deroy NP and As  needed .  Please contact office for sooner follow up if symptoms do not improve or worsen or seek emergency care            Carol Oaksammy Jerilynn Feldmeier, NP 05/27/2020

## 2020-06-27 IMAGING — CR CHEST - 2 VIEW
2 series · 2 of 2 positions shown · non-contrast
Comparison: CT chest 10/24/2018

CLINICAL DATA: Preoperative exam for knee replacement. Hx of asthma

EXAM:
CHEST - 2 VIEW

[w chest pa]
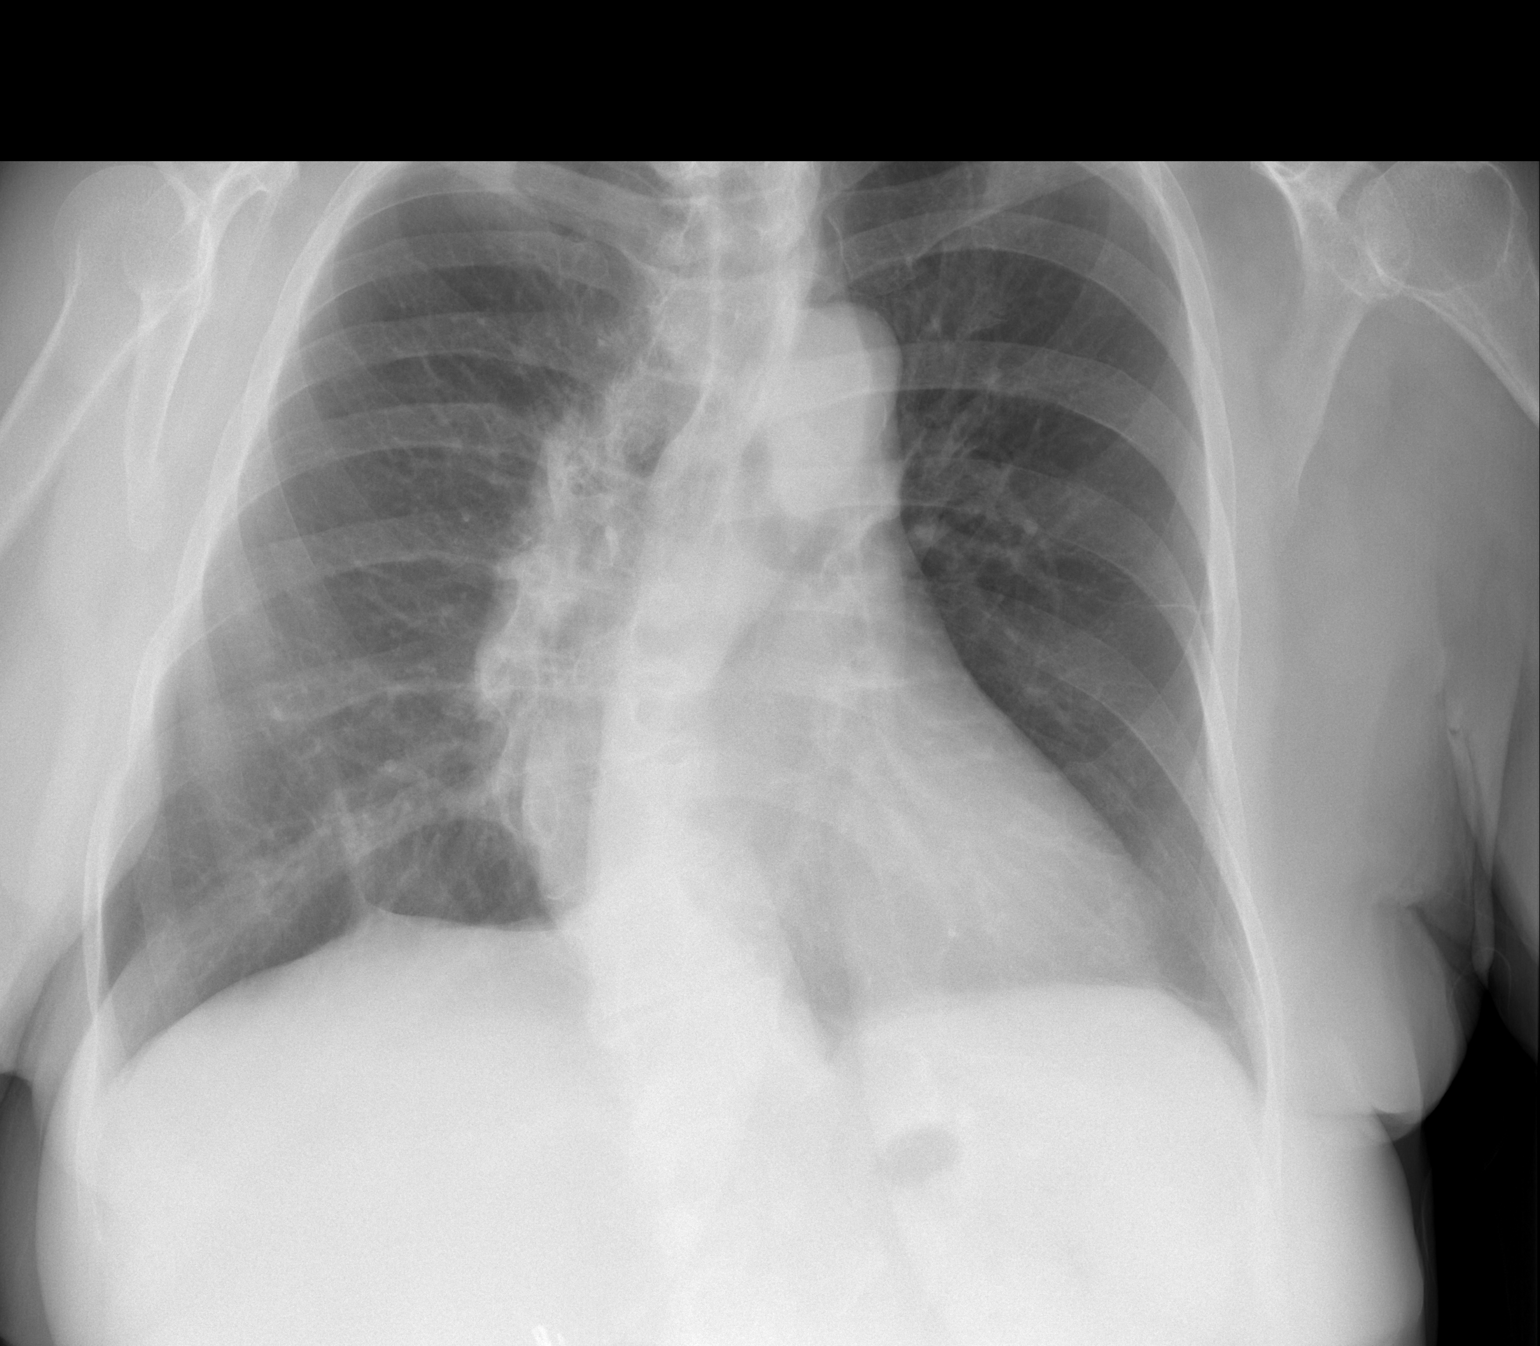

[w chest lat]
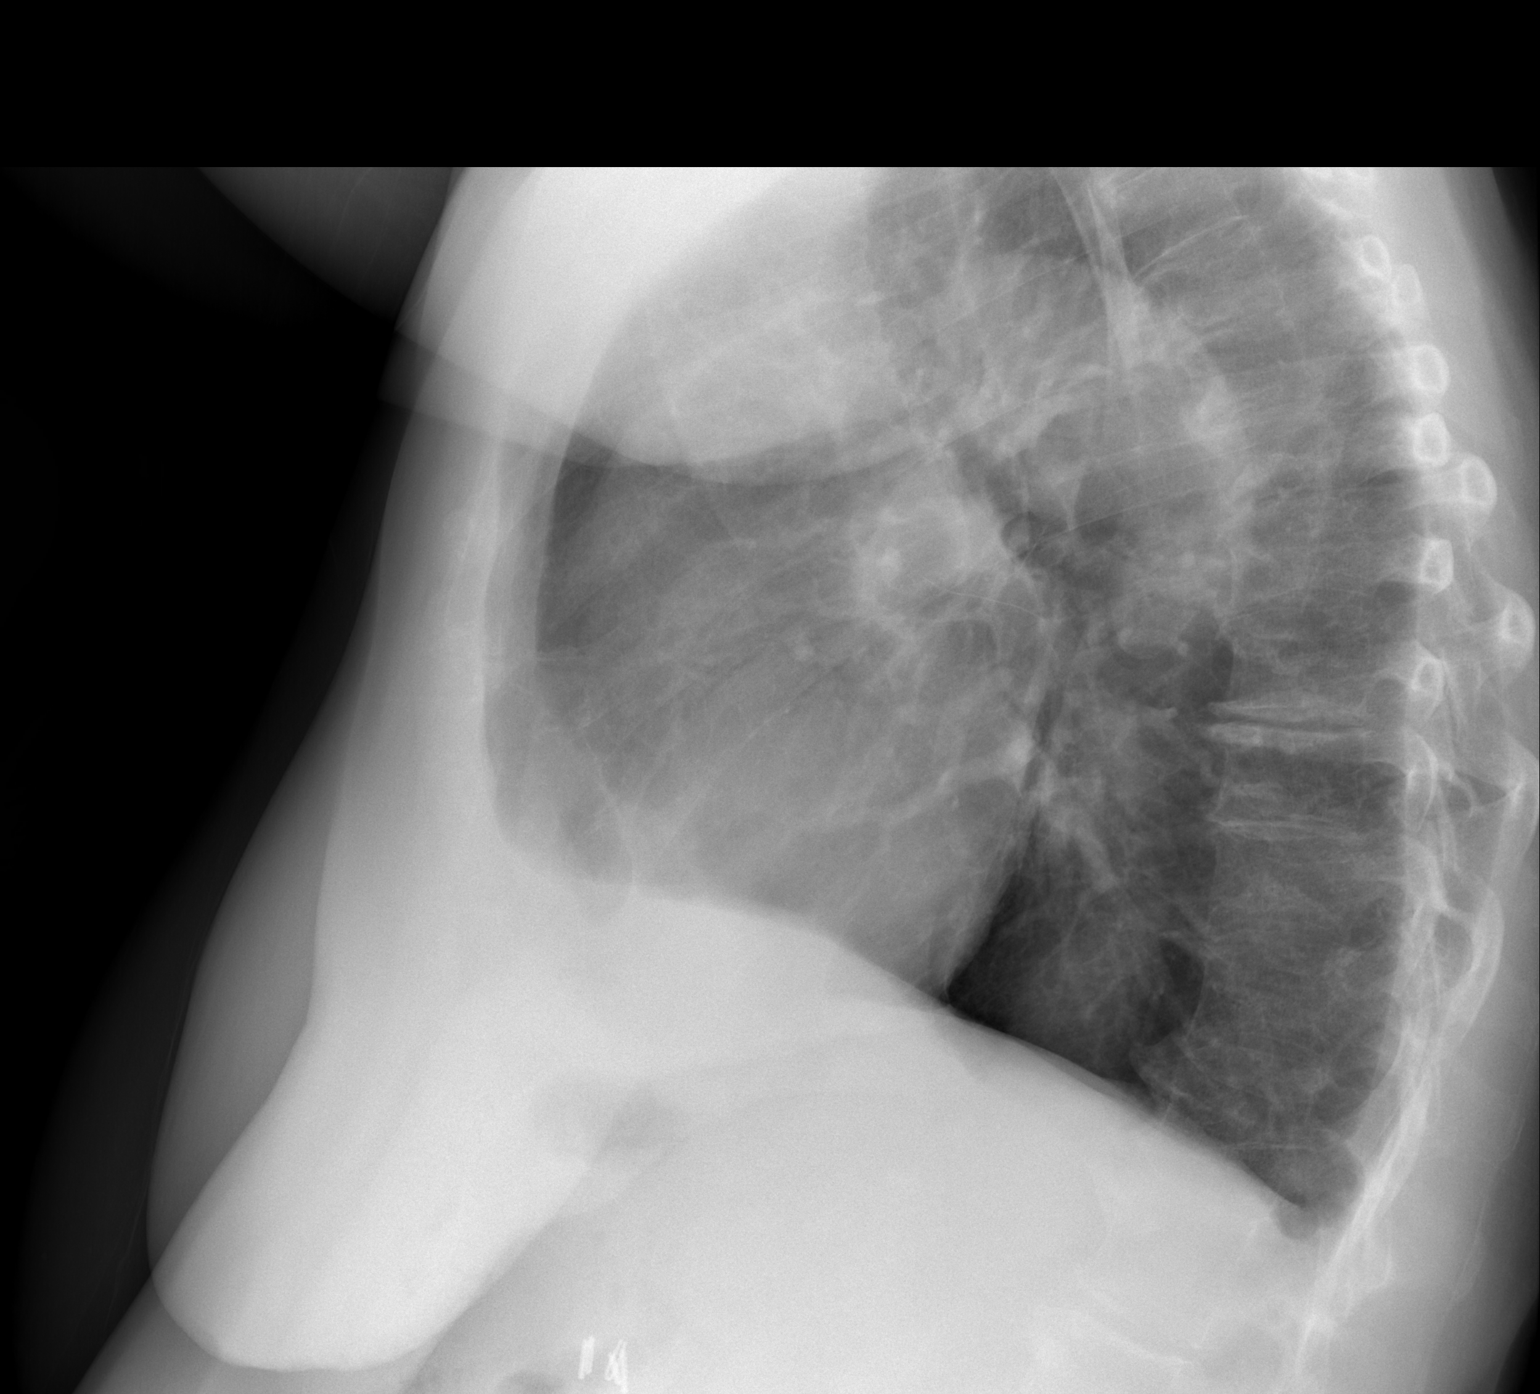

[2 of 2 positions shown; findings below may reference images not displayed]

FINDINGS: Mildly enlarged heart size. Tortuous thoracic aorta with
atherosclerotic calcification. Minimal bibasilar linear opacities
likely representing scarring. No new focal infiltrate. No
pneumothorax or pleural effusion. Scoliosis.
IMPRESSION: No active cardiopulmonary disease.

## 2020-08-18 IMAGING — CT CT CHEST W/O CM
2 of 4 series · 14 of 36 positions shown, 17 images · non-contrast
Comparison: 10/24/2018.

CLINICAL DATA: Lung nodule.

EXAM:
CT CHEST WITHOUT CONTRAST
TECHNIQUE: Multidetector CT imaging of the chest was performed following the
standard protocol without IV contrast.

[Series 2: thorax · axial · 0.75mm/px · z∈[+1065,+1305]mm · 11 of 141 slices shown, 14 images]
[im 11/141  mediastinal]
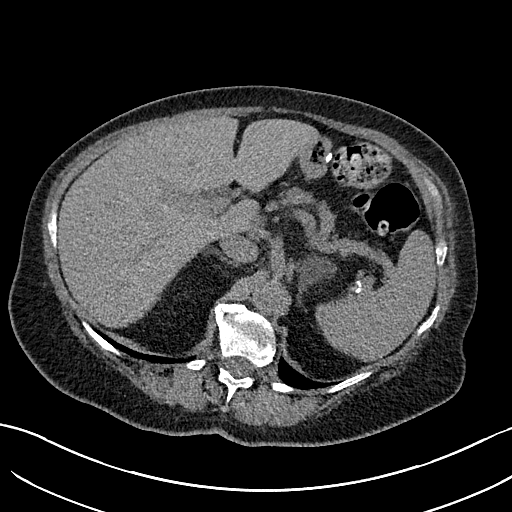
[im 11/141  lung]
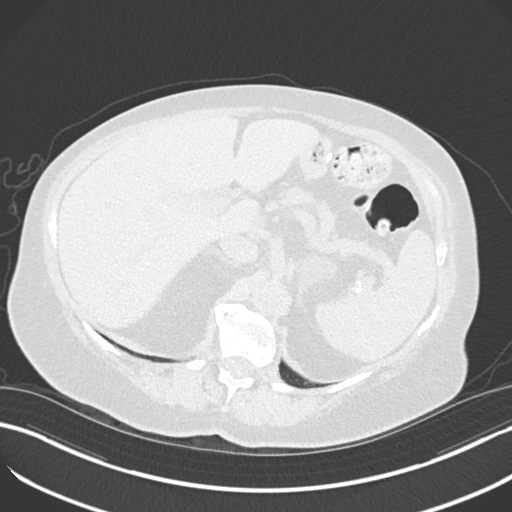
[im 21/141  lung]
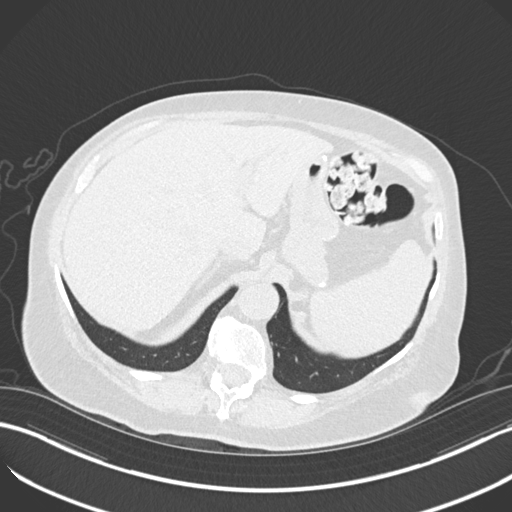
[im 31/141  lung]
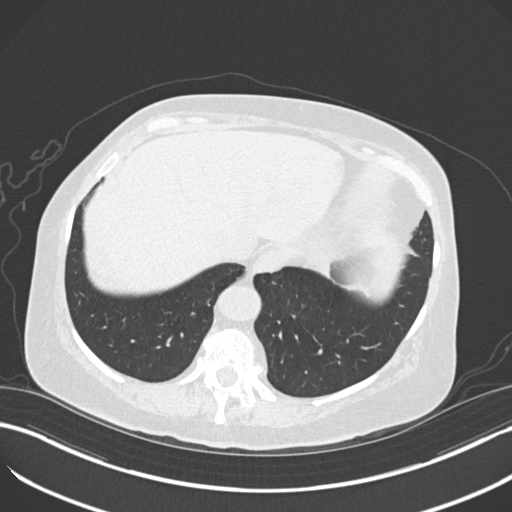
[im 51/141  lung]
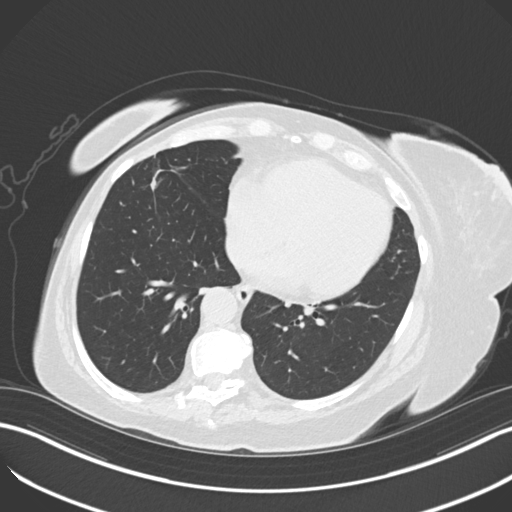
[im 61/141  mediastinal]
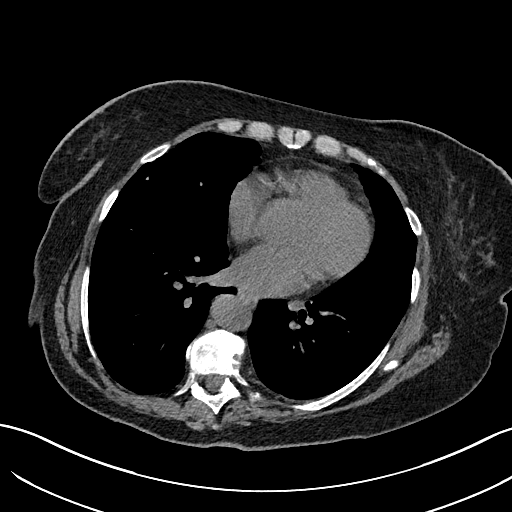
[im 61/141  lung]
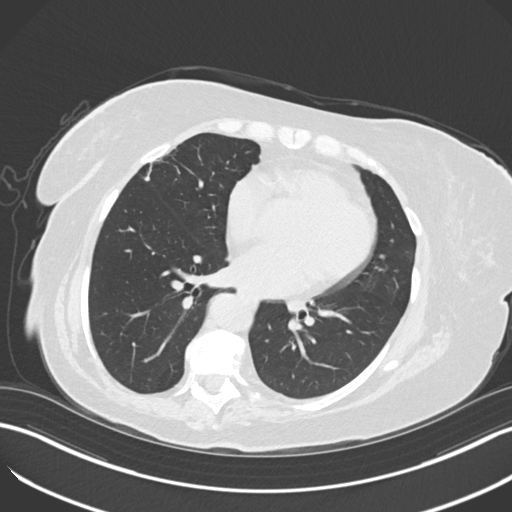
[im 71/141  lung]
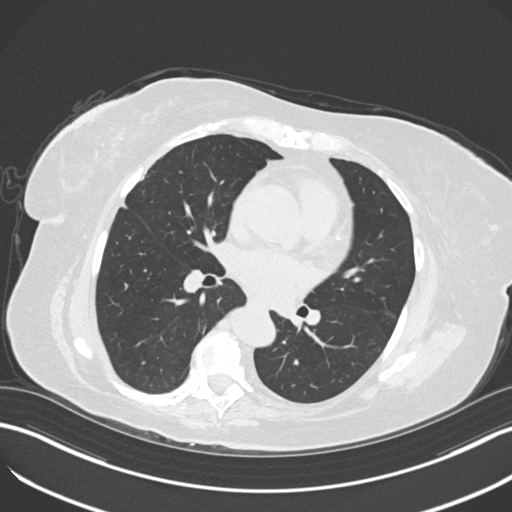
[im 81/141  lung]
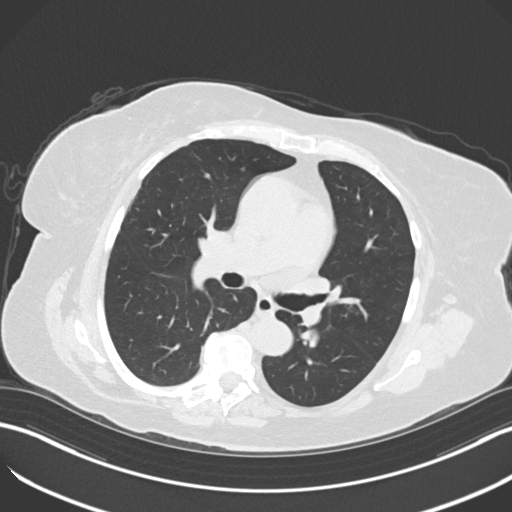
[im 91/141  lung]
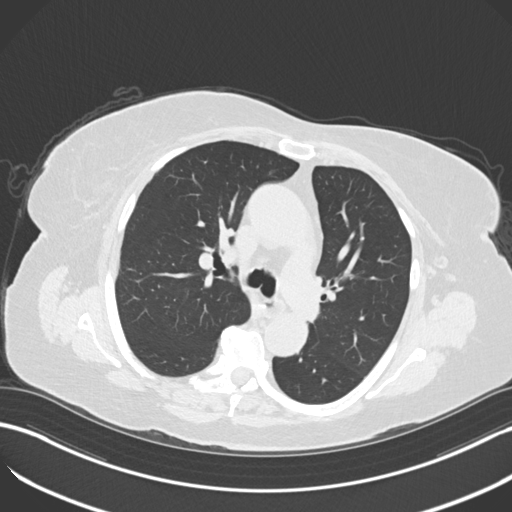
[im 111/141  mediastinal]
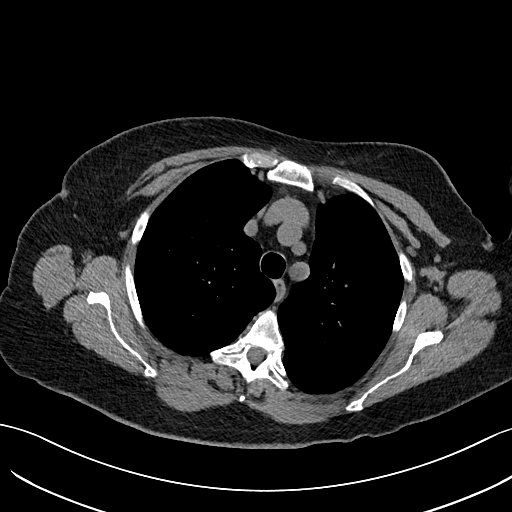
[im 111/141  lung]
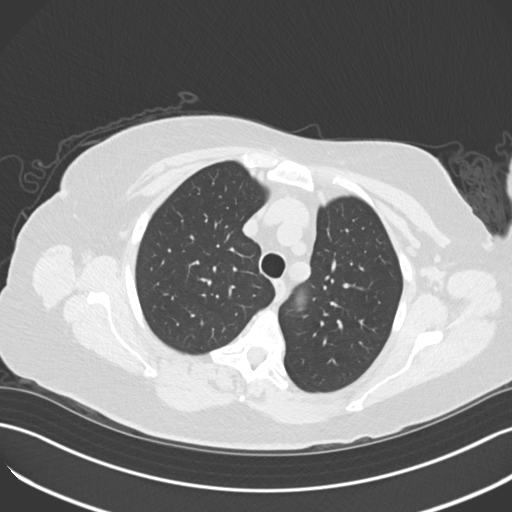
[im 121/141  lung]
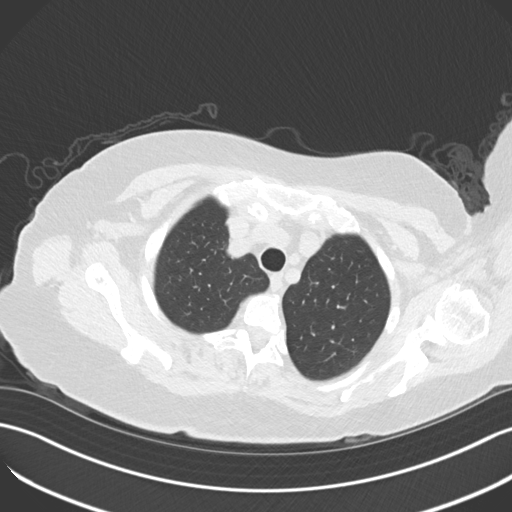
[im 131/141  lung]
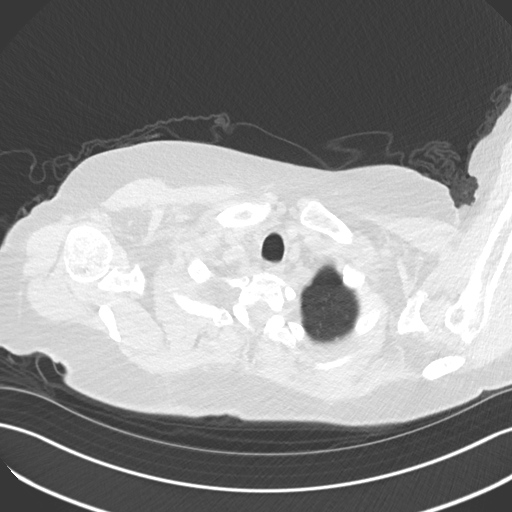

[Series 5: coronal · coronal · 0.57mm/px · 3 of 136 slices shown]
[im 28/136  lung]
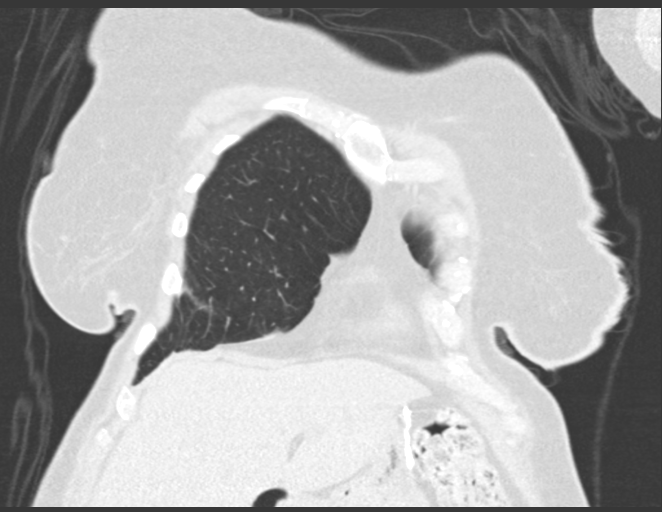
[im 55/136  lung]
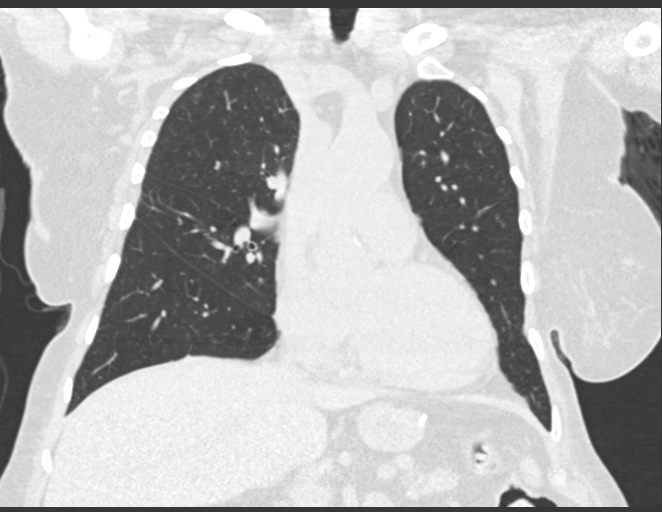
[im 82/136  lung]
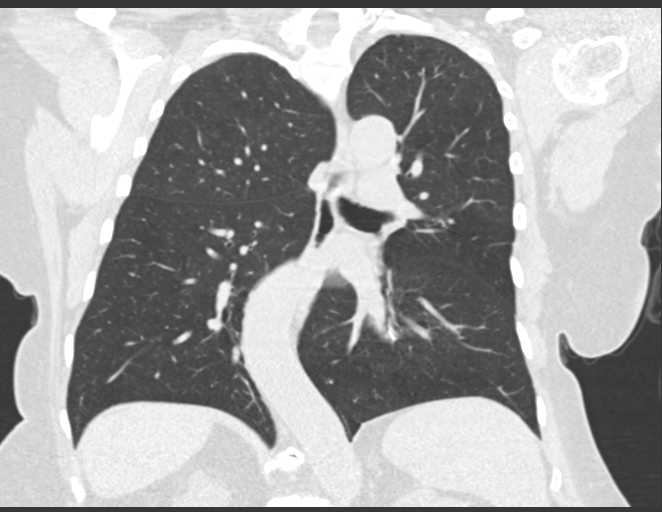

[14 of 36 positions shown; findings below may reference images not displayed]

FINDINGS: Cardiovascular: Atherosclerotic calcification of the aorta and
coronary arteries. Ascending aorta measures 4.0 cm. Heart size
normal. No pericardial effusion.

Mediastinum/Nodes: No pathologically enlarged mediastinal or
axillary lymph nodes. Hilar regions are difficult to definitively
evaluate without IV contrast but appear grossly unremarkable.
Esophagus is grossly unremarkable.

Lungs/Pleura: Pleuroparenchymal scarring in the lower right
hemithorax. 7 mm ground-glass nodule in the right lower lobe (4/88),
unchanged. 2 mm peripheral left upper lobe nodule ([DATE]), also
unchanged. Lungs are otherwise clear. No pleural fluid. Airway is
unremarkable.

Upper Abdomen: Visualized portions of the liver and right adrenal
gland are unremarkable. Fluid density left adrenal lesion measures
3.1 cm, as before. Low-attenuation lesions in the right kidney
measure up to approximately 1.7 cm and are difficult to further
characterize without post-contrast imaging. Visualized portions of
the spleen and pancreas are unremarkable. Gastric sleeve. No upper
abdominal adenopathy. Cholecystectomy.

Musculoskeletal: Degenerative changes in the spine. No worrisome
lytic or sclerotic lesions.
IMPRESSION: 1. 7 mm ground-glass right lower lobe nodule is stable. Annual
follow-up CT chest without contrast is recommended until 5 years of
documented stability. This recommendation follows the consensus
statement: Guidelines for Management of Small Pulmonary Nodules
Detected on CT Images: From the [HOSPITAL] 8944; Radiology
8944; [DATE].
2. Ascending Aortic aneurysm NOS (LK762-R3B.L). Recommend annual
imaging followup by CTA or MRA. This recommendation follows 7797
ACCF/AHA/AATS/ACR/ASA/SCA/MANIKA/JALALOWA/STEFEN/JOSESITO Guidelines for the
Diagnosis and Management of Patients with Thoracic Aortic Disease.
Circulation. 7797; 121: E266-e369. Aortic aneurysm NOS
(LK762-[id number].L).
3. Left adrenal adenoma.
4. Aortic atherosclerosis (LK762-170.0). Coronary artery
calcification.

## 2020-11-28 ENCOUNTER — Ambulatory Visit (INDEPENDENT_AMBULATORY_CARE_PROVIDER_SITE_OTHER): Payer: Medicare Other | Admitting: Adult Health

## 2020-11-28 ENCOUNTER — Other Ambulatory Visit: Payer: Self-pay

## 2020-11-28 ENCOUNTER — Encounter: Payer: Self-pay | Admitting: Adult Health

## 2020-11-28 DIAGNOSIS — R918 Other nonspecific abnormal finding of lung field: Secondary | ICD-10-CM | POA: Diagnosis not present

## 2020-11-28 DIAGNOSIS — J310 Chronic rhinitis: Secondary | ICD-10-CM | POA: Diagnosis not present

## 2020-11-28 DIAGNOSIS — J4530 Mild persistent asthma, uncomplicated: Secondary | ICD-10-CM | POA: Diagnosis not present

## 2020-11-28 DIAGNOSIS — G4733 Obstructive sleep apnea (adult) (pediatric): Secondary | ICD-10-CM | POA: Diagnosis not present

## 2020-11-28 MED ORDER — LEVALBUTEROL TARTRATE 45 MCG/ACT IN AERO: 1.0000 | INHALATION_SPRAY | RESPIRATORY_TRACT | 3 refills | Status: DC | PRN

## 2020-11-28 MED ORDER — SYMBICORT 160-4.5 MCG/ACT IN AERO: 2.0000 | INHALATION_SPRAY | Freq: Two times a day (BID) | RESPIRATORY_TRACT | 5 refills | Status: DC

## 2020-11-28 NOTE — Patient Instructions (Signed)
Continue on Symbicort 2 puffs Twice daily , rinse after use.  Continue on Singulair 10mg  At bedtime  .  Continue on Flonase 2 puffs daily As needed   Albuterol inhaler As needed  Wheezing .  Claritin 10mg  daily as needed.  Activity as tolerated.  Continue on CPAP At bedtime   Work on healthy weight.  Follow up in 6 months with Dr. or Jamesen Stahnke NP and As needed .  Please contact office for sooner follow up if symptoms do not improve or worsen or seek emergency care

## 2020-11-28 NOTE — Assessment & Plan Note (Signed)
Excellent control and compliance on CPAP.  No changes

## 2020-11-28 NOTE — Assessment & Plan Note (Signed)
Remains under good control.  Continue on current regimen

## 2020-11-28 NOTE — Assessment & Plan Note (Signed)
Continue on current regimen.  Trigger prevention.  No changes

## 2020-11-28 NOTE — Assessment & Plan Note (Signed)
Previous CT chest September 2021 showed stable to decrease nodules consistent with a benign etiology.  Patient is a never smoker..  No further imaging is indicated at this time.

## 2020-11-28 NOTE — Progress Notes (Signed)
@Patient  ID: , female    DOB: August 18, 1951, 70 y.o.   MRN: [id number]  Chief Complaint  Patient presents with  . Follow-up    Referring provider: [id number], FNP  HPI: 70 year old female never smoker followed for moderate persistent asthma, chronic rhinitis, obstructive sleep apnea and pulmonary nodules Morbid obesity status post bariatric surgery and 2019 Patient is from 2020   TEST/EVENTS :  FENO 08/2018 -10 ppb IgE 09/19/18 -76 Eosinophils 100  HST 10/25/18- AHI 6.8, SpO2 low 87%  10/24/2018 high-res CT chest no ILD, 7 mm right lower lobe nodule. Left adrenal adenoma, ectatic 4 cm ascending thoracic aorta   11/28/2020 Follow up : Asthma , AR, OSA , Lung nodules  Patient returns for a 50-month follow-up.  Patient has underlying asthma.  Says overall breathing is doing okay.  She gets some short of breath with heavy activities.  She denies any flare of cough or wheezing.  Is had no increased albuterol use.  She remains on Symbicort twice daily and Singulair daily.  She does use Claritin and Flonase as needed. Covid vaccines including booster up-to-date  Previous CT chest showed pulmonary nodules.  A follow-up CT chest May 20, 2020 showed pulmonary nodules are stable or decreased in size considered with a benign etiology.  Patient is a never smoker.  Patient has underlying obstructive sleep apnea is on nocturnal CPAP.  Patient says she is doing well on CPAP.  She feels that she benefits from CPAP with no significant daytime sleepiness.  CPAP download shows excellent compliance at 100% , Avg usage 7hr/night . AHI 3. Uses nasal mask.   Remains active, goes to exercise 3-4 times a week. Water aerobics, works with May 22, 2020.    Allergies  Allergen Reactions  . Shellfish Allergy Anaphylaxis  . Pollen Extract Other (See Comments)    Dust and smoke/ causes watery eyes; sneezing    Immunization History  Administered Date(s)  Administered  . Fluad Quad(high Dose 65+) 05/22/2019  . Influenza, High Dose Seasonal PF 05/06/2018, 05/22/2019, 05/22/2020  . Influenza, Seasonal, Injecte, Preservative Fre 05/18/2017  . PFIZER(Purple Top)SARS-COV-2 Vaccination 11/05/2019, 12/05/2019  . Pneumococcal Conjugate-13 08/25/2018  . Pneumococcal Polysaccharide-23 08/02/2017  . Td 03/07/2019  . Zoster Recombinat (Shingrix) 03/05/2019, 05/06/2019    Past Medical History:  Diagnosis Date  . Apnea   . Arthritis   . Asthma   . Diabetes mellitus without complication (HCC)    no medications  . Diverticulitis   . Fibromyalgia   . Hypertension     Tobacco History: Social History   Tobacco Use  Smoking Status Never Smoker  Smokeless Tobacco Never Used   Counseling given: Not Answered   Outpatient Medications Prior to Visit  Medication Sig Dispense Refill  . albuterol (PROVENTIL HFA;VENTOLIN HFA) 108 (90 Base) MCG/ACT inhaler Inhale 2 puffs into the lungs every 6 (six) hours as needed for wheezing or shortness of breath. 1 Inhaler 6  . Ascorbic Acid (VITAMIN C) 1000 MG tablet Take 1,000 mg by mouth daily.    07/06/2019 b complex vitamins tablet Take 1 tablet by mouth every evening.     . Calcium-Phosphorus-Vitamin D (CITRACAL +D3 PO) Take 1 tablet by mouth every evening.    . cetirizine (ZYRTEC) 10 MG tablet Take 10 mg by mouth daily.    . Cholecalciferol (VITAMIN D3) 125 MCG (5000 UT) CAPS Take 5,000 Units by mouth daily.     Marland Kitchen docusate sodium (COLACE) 100 MG capsule Take 300 mg  by mouth at bedtime.    . DULoxetine (CYMBALTA) 60 MG capsule Take 60 mg by mouth daily.    . fluticasone (FLONASE) 50 MCG/ACT nasal spray Place 2 sprays into both nostrils daily. (Patient taking differently: Place 2 sprays into both nostrils daily as needed.) 16 g 2  . losartan-hydrochlorothiazide (HYZAAR) 50-12.5 MG tablet Take 1 tablet by mouth daily.    . Methylcellulose, Laxative, (CITRUCEL) 500 MG TABS Take 500 mg by mouth 2 (two) times a day.      . montelukast (SINGULAIR) 10 MG tablet Take 10 mg by mouth daily.     Marland Kitchen MUCINEX MAXIMUM STRENGTH 1200 MG TB12 Take 1,200 mg by mouth every evening. Takes every other day in the evening    . Multiple Vitamin (MULTIVITAMIN WITH MINERALS) TABS tablet Take 1 tablet by mouth daily. One-A-Day Women's    . saccharomyces boulardii (FLORASTOR) 250 MG capsule Take 250 mg by mouth daily.     Marland Kitchen levalbuterol (XOPENEX HFA) 45 MCG/ACT inhaler Inhale 1-2 puffs into the lungs every 4 (four) hours as needed for wheezing.    . SYMBICORT 160-4.5 MCG/ACT inhaler Inhale 2 puffs into the lungs 2 (two) times daily. 1 Inhaler 5  . aspirin EC 81 MG tablet Take 1 tablet (81 mg total) by mouth 2 (two) times daily. (Patient not taking: Reported on 11/28/2020) 60 tablet 0  . Biotin 5000 MCG TABS Take 10,000 mcg by mouth every evening.  (Patient not taking: Reported on 11/28/2020)    . bisacodyl (DULCOLAX) 5 MG EC tablet Take 5 mg by mouth daily.  (Patient not taking: Reported on 11/28/2020)     No facility-administered medications prior to visit.     Review of Systems:   Constitutional:   No  weight loss, night sweats,  Fevers, chills, fatigue, or  lassitude.  HEENT:   No headaches,  Difficulty swallowing,  Tooth/dental problems, or  Sore throat,                No sneezing, itching, ear ache, +nasal congestion, post nasal drip,   CV:  No chest pain,  Orthopnea, PND, swelling in lower extremities, anasarca, dizziness, palpitations, syncope.   GI  No heartburn, indigestion, abdominal pain, nausea, vomiting, diarrhea, change in bowel habits, loss of appetite, bloody stools.   Resp:    No excess mucus, no productive cough,  No non-productive cough,  No coughing up of blood.  No change in color of mucus.  No wheezing.  No chest wall deformity  Skin: no rash or lesions.  GU: no dysuria, change in color of urine, no urgency or frequency.  No flank pain, no hematuria   MS:  No joint pain or swelling.  No decreased range of  motion.  No back pain.    Physical Exam  BP 130/80 (BP Location: Left Arm, Patient Position: Sitting, Cuff Size: Large)   Pulse 83   Temp 97.8 F (36.6 C) (Temporal)   Ht 5\' 1"  (1.549 m)   Wt 229 lb 12.8 oz (104.2 kg)   SpO2 95%   BMI 43.42 kg/m   GEN: A/Ox3; pleasant , NAD, well nourished    HEENT:  [place]/AT,    NOSE-clear, THROAT-clear, no lesions, no postnasal drip or exudate noted.   NECK:  Supple w/ fair ROM; no JVD; normal carotid impulses w/o bruits; no thyromegaly or nodules palpated; no lymphadenopathy.    RESP  Clear  P & A; w/o, wheezes/ rales/ or rhonchi. no accessory muscle use, no  dullness to percussion  CARD:  RRR, no m/r/g, no peripheral edema, pulses intact, no cyanosis or clubbing.  GI:   Soft & nt; nml bowel sounds; no organomegaly or masses detected.   Musco: Warm bil, no deformities or joint swelling noted.   Neuro: alert, no focal deficits noted.    Skin: Warm, no lesions or rashes    Lab Results:  CBC    Component Value Date/Time   WBC 8.4 10/13/2019 1412   RBC 4.44 10/13/2019 1412   HGB 13.0 10/13/2019 1412   HCT 41.3 10/13/2019 1412   PLT 259 10/13/2019 1412   MCV 93.0 10/13/2019 1412   MCH 29.3 10/13/2019 1412   MCHC 31.5 10/13/2019 1412   RDW 13.6 10/13/2019 1412   LYMPHSABS 2.2 10/13/2019 1412   MONOABS 0.4 10/13/2019 1412   EOSABS 0.1 10/13/2019 1412   BASOSABS 0.1 10/13/2019 1412    BMET    Component Value Date/Time   NA 141 10/13/2019 1412   K 3.6 10/13/2019 1412   CL 103 10/13/2019 1412   CO2 31 10/13/2019 1412   GLUCOSE 136 (H) 10/13/2019 1412   BUN 22 10/13/2019 1412   CREATININE 0.55 10/13/2019 1412   CALCIUM 10.0 10/13/2019 1412   GFRNONAA >60 10/13/2019 1412   GFRAA >60 10/13/2019 1412    BNP No results found for: BNP  ProBNP No results found for: PROBNP  Imaging: No results found.    PFT Results Latest Ref Rng & Units 11/01/2018  FVC-Pre L 2.36  FVC-Predicted Pre % 85  FVC-Post L 2.38  FVC-Predicted  Post % 85  Pre FEV1/FVC % % 73  Post FEV1/FCV % % 70  FEV1-Pre L 1.72  FEV1-Predicted Pre % 81  FEV1-Post L 1.67  DLCO uncorrected ml/min/mmHg 24.98  DLCO UNC% % 140  DLCO corrected ml/min/mmHg 24.83  DLCO COR %Predicted % 139  DLVA Predicted % 135    Lab Results  Component Value Date   NITRICOXIDE 10 09/26/2018        Assessment & Plan:   Asthma Remains under good control.  Continue on current regimen  Chronic rhinitis Continue on current regimen.  Trigger prevention.  No changes  OSA (obstructive sleep apnea) Excellent control and compliance on CPAP.  No changes  Lung nodules Previous CT chest September 2021 showed stable to decrease nodules consistent with a benign etiology.  Patient is a never smoker..  No further imaging is indicated at this time.     Rubye Oaks, NP 11/28/2020

## 2021-06-05 ENCOUNTER — Encounter: Payer: Self-pay | Admitting: Adult Health

## 2021-06-05 ENCOUNTER — Ambulatory Visit: Payer: Medicare Other | Admitting: Adult Health

## 2021-06-05 ENCOUNTER — Ambulatory Visit (INDEPENDENT_AMBULATORY_CARE_PROVIDER_SITE_OTHER): Payer: Medicare Other | Admitting: Adult Health

## 2021-06-05 ENCOUNTER — Other Ambulatory Visit: Payer: Self-pay

## 2021-06-05 VITALS — BP 128/82 | HR 71 | Temp 98.0°F | Ht 61.0 in | Wt 230.2 lb

## 2021-06-05 DIAGNOSIS — J4530 Mild persistent asthma, uncomplicated: Secondary | ICD-10-CM

## 2021-06-05 DIAGNOSIS — R9389 Abnormal findings on diagnostic imaging of other specified body structures: Secondary | ICD-10-CM

## 2021-06-05 DIAGNOSIS — R0602 Shortness of breath: Secondary | ICD-10-CM | POA: Diagnosis not present

## 2021-06-05 DIAGNOSIS — J310 Chronic rhinitis: Secondary | ICD-10-CM | POA: Diagnosis not present

## 2021-06-05 DIAGNOSIS — G4733 Obstructive sleep apnea (adult) (pediatric): Secondary | ICD-10-CM

## 2021-06-05 DIAGNOSIS — I2510 Atherosclerotic heart disease of native coronary artery without angina pectoris: Secondary | ICD-10-CM | POA: Diagnosis not present

## 2021-06-05 NOTE — Assessment & Plan Note (Signed)
Continue on nocturnal CPAP  Plan  Patient Instructions  Continue on Symbicort 2 puffs Twice daily , rinse after use.  Continue on Singulair 10mg  At bedtime  .  Albuterol inhaler As needed  Wheezing .  Claritin 10mg  daily as needed.  Activity as tolerated.  Continue on CPAP At bedtime   Work on healthy weight.  Refer to cardiology .  Follow up in 6 months with Dr. or Anyia Gierke NP and As needed .  Please contact office for sooner follow up if symptoms do not improve or worsen or seek emergency care

## 2021-06-05 NOTE — Assessment & Plan Note (Signed)
Continue on current regimen .   

## 2021-06-05 NOTE — Assessment & Plan Note (Signed)
CT chest shows CAD /Atherosclerosis -Aorta/coronaries -  Referral to Cardiology per request .
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
## 2021-06-05 NOTE — Patient Instructions (Addendum)
Continue on Symbicort 2 puffs Twice daily , rinse after use.  Continue on Singulair 10mg  At bedtime  .  Albuterol inhaler As needed  Wheezing .  Claritin 10mg  daily as needed.  Activity as tolerated.  Continue on CPAP At bedtime   Work on healthy weight.  Refer to cardiology .  Follow up in 6 months with Dr. or Link Burgeson NP and As needed .  Please contact office for sooner follow up if symptoms do not improve or worsen or seek emergency care

## 2021-06-05 NOTE — Assessment & Plan Note (Signed)
Mild persistent asthma.  Patient is encouraged on trigger prevention.  We will continue on current regimen with Symbicort Singulair and Claritin.  Plan  . Patient Instructions  Continue on Symbicort 2 puffs Twice daily , rinse after use.  Continue on Singulair 10mg  At bedtime  .  Albuterol inhaler As needed  Wheezing .  Claritin 10mg  daily as needed.  Activity as tolerated.  Continue on CPAP At bedtime   Work on healthy weight.  Refer to cardiology .  Follow up in 6 months with Dr. or Joyous Gleghorn NP and As needed .  Please contact office for sooner follow up if symptoms do not improve or worsen or seek emergency care

## 2021-07-28 ENCOUNTER — Ambulatory Visit (INDEPENDENT_AMBULATORY_CARE_PROVIDER_SITE_OTHER): Payer: Medicare Other | Admitting: Cardiology

## 2021-07-28 ENCOUNTER — Other Ambulatory Visit: Payer: Self-pay

## 2021-07-28 ENCOUNTER — Encounter: Payer: Self-pay | Admitting: Cardiology

## 2021-07-28 VITALS — BP 148/84 | HR 62 | Ht 61.0 in | Wt 221.6 lb

## 2021-07-28 DIAGNOSIS — I2584 Coronary atherosclerosis due to calcified coronary lesion: Secondary | ICD-10-CM | POA: Diagnosis not present

## 2021-07-28 DIAGNOSIS — I10 Essential (primary) hypertension: Secondary | ICD-10-CM | POA: Diagnosis not present

## 2021-07-28 DIAGNOSIS — I2510 Atherosclerotic heart disease of native coronary artery without angina pectoris: Secondary | ICD-10-CM | POA: Diagnosis not present

## 2021-07-28 DIAGNOSIS — E785 Hyperlipidemia, unspecified: Secondary | ICD-10-CM | POA: Diagnosis not present

## 2021-07-28 MED ORDER — METOPROLOL TARTRATE 50 MG PO TABS: 50.0000 mg | ORAL_TABLET | Freq: Once | ORAL | 0 refills | Status: DC

## 2021-07-28 MED ORDER — PREDNISONE 50 MG PO TABS: ORAL_TABLET | ORAL | 0 refills | Status: DC

## 2021-07-28 NOTE — Patient Instructions (Addendum)
Medication Instructions:  Your physician recommends that you continue on your current medications as directed. Please refer to the Current Medication list given to you today.  *If you need a refill on your cardiac medications before your next appointment, please call your pharmacy*  Labs: TODAY: BMET  Testing/Procedures: Your physician has requested that you have an echocardiogram. Echocardiography is a painless test that uses sound waves to create images of your heart. It provides your doctor with information about the size and shape of your heart and how well your heart's chambers and valves are working. This procedure takes approximately one hour. There are no restrictions for this procedure.  Your physician has requested that you have a coronary CTA scan. Please see below for further instructions.   Follow-Up: At [facility], you and your health needs are our priority.  As part of our continuing mission to provide you with exceptional heart care, we have created designated Provider Care Teams.  These Care Teams include your primary Cardiologist (physician) and Advanced Practice Providers (APPs -  Physician Assistants and Nurse Practitioners) who all work together to provide you with the care you need, when you need it.  Follow up with Dr. Mayford Knife as needed based on results of testing.   You have been referred to see the Pharmacist in our Lipid Clinic.   Other Instructions   Your cardiac CT will be scheduled at:   [facility] [address] [place], [place] [address] [phone]  Please arrive at the [facility] main entrance (entrance A) of [facility] 30 minutes prior to test start time. You can use the FREE valet parking offered at the main entrance (encouraged to control the heart rate for the test) Proceed to the [facility] Radiology Department (first floor) to check-in and test prep.  Please follow these instructions carefully (unless otherwise  directed):  On the Night Before the Test: Be sure to Drink plenty of water. Do not consume any caffeinated/decaffeinated beverages or chocolate 12 hours prior to your test. Do not take any antihistamines 12 hours prior to your test. If the patient has contrast allergy: Patient will need a prescription for Prednisone and very clear instructions (as follows): Prednisone 50 mg - take 13 hours prior to test Take another Prednisone 50 mg 7 hours prior to test Take another Prednisone 50 mg 1 hour prior to test Take Benadryl 50 mg 1 hour prior to test Patient must complete all four doses of above prophylactic medications. Patient will need a ride after test due to Benadryl.  On the Day of the Test: Drink plenty of water until 1 hour prior to the test. Do not eat any food 4 hours prior to the test. You may take your regular medications prior to the test.  Take metoprolol (Lopressor) two hours prior to test. HOLD Furosemide/Hydrochlorothiazide morning of the test. FEMALES- please wear underwire-free bra if available, avoid dresses & tight clothing      After the Test: Drink plenty of water. After receiving IV contrast, you may experience a mild flushed feeling. This is normal. On occasion, you may experience a mild rash up to 24 hours after the test. This is not dangerous. If this occurs, you can take Benadryl 25 mg and increase your fluid intake. If you experience trouble breathing, this can be serious. If it is severe call 911 IMMEDIATELY. If it is mild, please call our office. If you take any of these medications: Glipizide/Metformin, Avandament, Glucavance, please do  not take 48 hours after completing test unless otherwise instructed.  Please allow 2-4 weeks for scheduling of routine cardiac CTs. Some insurance companies require a pre-authorization which may delay scheduling of this test.   For non-scheduling related questions, please contact the cardiac imaging nurse navigator should you  have any questions/concerns: Rockwell Alexandria, Cardiac Imaging Nurse Navigator Larey Brick, Cardiac Imaging Nurse Navigator [facility] Direct Office Dial: [phone]   For scheduling needs, including cancellations and rescheduling, please call [place], [phone].

## 2021-07-28 NOTE — Progress Notes (Addendum)
Cardiology CONSULT Note    Date:  07/28/2021   ID:  Carol Gilbert, DOB 12-Nov-1950, MRN [id number]  PCP:  Stevphen Rochester, MD  Cardiologist:  Armanda Magic, MD   Chief Complaint  Patient presents with   New Patient (Initial Visit)    Coronary artery calcifications     History of Present Illness:  Carol Gilbert is a 70 y.o. female who is being seen today for the evaluation of coronary artery calcifications and chest tightness at the request of Parrett, Virgel Bouquet, NP.  This is a 70yo female with a hx of asthma, DM, fibromyalgia and HTN.  She is followed by pulmonary for her moderate persistent asthma with chronic rhinitis, OSA and pulmonary nodules.  She has morbid obesity and had bariatric surgery in 2019.  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  She recently told Rubye Oaks, NP that she was having intermittent chest tightness in the am and before bed.  This has been going on for about 2 years but has been stable.  It occurs most days.  She says that the tightness feels like she cannot breathe.  She usually is up moving around in the am and is mainly exertional.  At night she usually is getting ready for bed.  Her chest will get tight and she cannot breathe.  She denies any nausea or diaphoresis.  There is no radiation into her neck or jaw.  SHe has chronic SOB but this is stable.  She denies any LE edema, PND, orthopnea, dizziness, palpitations or syncope.    Past Medical History:  Diagnosis Date   Apnea    Arthritis    Asthma    Diabetes mellitus without complication (HCC)    no medications   Diverticulitis    Fibromyalgia    Hypertension     Past Surgical History:  Procedure Laterality Date   ABDOMINAL HYSTERECTOMY     ABDOMINAL SURGERY     APPENDECTOMY     BARIATRIC SURGERY N/A 08/10/2018   BREAST BIOPSY     CESAREAN SECTION     CHOLECYSTECTOMY     HERNIA REPAIR     SINUS SURGERY  WITH INSTATRAK     TOTAL KNEE ARTHROPLASTY Left 04/03/2019   Procedure: Left Knee Arthroplasty;  Surgeon: Gean Birchwood, MD;  Location: [facility];  Service: Orthopedics;  Laterality: Left;   TOTAL SHOULDER ARTHROPLASTY Left 10/19/2019   Procedure: TOTAL SHOULDER ARTHROPLASTY;  Surgeon: Jones Broom, MD;  Location: [facility];  Service: Orthopedics;  Laterality: Left;   WISDOM TOOTH EXTRACTION      Current Medications: Current Meds  Medication Sig   Ascorbic Acid (VITAMIN C) 1000 MG tablet Take 1,000 mg by mouth daily.   b complex vitamins tablet Take 1 tablet by mouth every evening.    Calcium-Phosphorus-Vitamin D (CITRACAL +D3 PO) Take 1 tablet by mouth every evening.   cetirizine (ZYRTEC) 10 MG tablet Take 10 mg by mouth daily.   Cholecalciferol (VITAMIN D3) 125 MCG (5000 UT) CAPS Take 5,000 Units by mouth daily.    docusate sodium (COLACE) 100 MG capsule Take 300 mg by mouth at bedtime.   DULoxetine (CYMBALTA) 60 MG capsule Take 60 mg by mouth daily.   levalbuterol (XOPENEX HFA) 45 MCG/ACT inhaler Inhale 1-2 puffs into the lungs every 4 (four) hours as needed for wheezing.   losartan-hydrochlorothiazide (HYZAAR) 50-12.5 MG tablet Take 1 tablet by mouth  daily.   meloxicam (MOBIC) 15 MG tablet Take 15 mg by mouth daily.   Methylcellulose, Laxative, (CITRUCEL) 500 MG TABS Take 500 mg by mouth 2 (two) times a day.    montelukast (SINGULAIR) 10 MG tablet Take 10 mg by mouth daily.    MUCINEX MAXIMUM STRENGTH 1200 MG TB12 Take 1,200 mg by mouth every evening. Takes every other day in the evening   Multiple Vitamin (MULTIVITAMIN WITH MINERALS) TABS tablet Take 1 tablet by mouth daily. One-A-Day Women's   POTASSIUM PO Take by mouth.   SYMBICORT 160-4.5 MCG/ACT inhaler Inhale 2 puffs into the lungs 2 (two) times daily.   traMADol (ULTRAM) 50 MG tablet Take by mouth every 6 (six) hours as needed.    Allergies:   Shellfish allergy and Pollen extract   Social History   Socioeconomic History    Marital status: Divorced    Spouse name: Not on file   Number of children: Not on file   Years of education: Not on file   Highest education level: Not on file  Occupational History   Not on file  Tobacco Use   Smoking status: Never   Smokeless tobacco: Never  Vaping Use   Vaping Use: Never used  Substance and Sexual Activity   Alcohol use: No   Drug use: No   Sexual activity: Not on file  Other Topics Concern   Not on file  Social History Narrative   Not on file   Social Determinants of Health   Financial Resource Strain: Not on file  Food Insecurity: Not on file  Transportation Needs: Not on file  Physical Activity: Not on file  Stress: Not on file  Social Connections: Not on file     Family History:  The patient's family history is not on file.   ROS:   Please see the history of present illness.    ROS All other systems reviewed and are negative.  No flowsheet data found.     PHYSICAL EXAM:   VS:  BP (!) 148/84   Pulse 62   Ht 5\' 1"  (1.549 m)   Wt 221 lb 9.6 oz (100.5 kg)   SpO2 97%   BMI 41.87 kg/m    GEN: Well nourished, well developed, in no acute distress  HEENT: normal  Neck: no JVD, carotid bruits, or masses Cardiac: RRR; no murmurs, rubs, or gallops,no edema.  Intact distal pulses bilaterally.  Respiratory:  clear to auscultation bilaterally, normal work of breathing GI: soft, nontender, nondistended, + BS MS: no deformity or atrophy  Skin: warm and dry, no rash Neuro:  Alert and Oriented x 3, Strength and sensation are intact Psych: euthymic mood, full affect  Wt Readings from Last 3 Encounters:  07/28/21 221 lb 9.6 oz (100.5 kg)  06/05/21 230 lb 3.2 oz (104.4 kg)  11/28/20 229 lb 12.8 oz (104.2 kg)      Studies/Labs Reviewed:   EKG:  EKG is ordered today.  The ekg ordered today demonstrates NSR with low voltage QRS  Recent Labs: No results found for requested labs within last 8760 hours.   Lipid Panel No results found for: CHOL,  TRIG, HDL, CHOLHDL, VLDL, LDLCALC, LDLDIRECT  Additional studies/ records that were reviewed today include:  OV notes from Pulmonary    ASSESSMENT:    1. Coronary artery calcification   2. Benign essential HTN   3. Hyperlipidemia LDL goal <70      PLAN:  In order of problems listed above:  Coronary artery calcifications/Chest tightness -coronary calcifications noted on chest CT in RCA and LAD -she is having chest tightness mainly with exertional activities but as minimal as getting ready for bed -she has CRFs including HLD, morbid obesity, DM and HTN -I will get a coronary CTA to define coronary anatomy -check 2D echo to assess LVF  2. HTN -BP borderline controlled on exam today -continue prescription drug management with Losartan HCT 50-12.5mg  daily with PRN refills  3. HLD -LDL goal < 70 due to coronary calcifications -I will get a copy of her most recent lipids -statin therapy was stopped due to fibromyalgia -Refer to lipid clinic  Time Spent: 25 minutes total time of encounter, including 20 minutes spent in face-to-face patient care on the date of this encounter. This time includes coordination of care and counseling regarding above mentioned problem list. Remainder of non-face-to-face time involved reviewing chart documents/testing relevant to the patient encounter and documentation in the medical record. I have independently reviewed documentation from referring provider  Medication Adjustments/Labs and Tests Ordered: Current medicines are reviewed at length with the patient today.  Concerns regarding medicines are outlined above.  Medication changes, Labs and Tests ordered today are listed in the Patient Instructions below.     Signed, Armanda Magic, MD  07/28/2021 3:21 PM    [facility] [address] [place], [place], [place]  [address] Phone: [phone]; Fax: [phone]

## 2021-07-28 NOTE — Addendum Note (Signed)
Addended by: Theresia Majors on: 07/28/2021 03:30 PM   Modules accepted: Orders

## 2021-07-29 LAB — BASIC METABOLIC PANEL
BUN/Creatinine Ratio: 38 — ABNORMAL HIGH (ref 12–28)
BUN: 21 mg/dL (ref 8–27)
CO2: 24 mmol/L (ref 20–29)
Calcium: 10.2 mg/dL (ref 8.7–10.3)
Chloride: 100 mmol/L (ref 96–106)
Creatinine, Ser: 0.55 mg/dL — ABNORMAL LOW (ref 0.57–1.00)
Glucose: 101 mg/dL — ABNORMAL HIGH (ref 70–99)
Potassium: 4.6 mmol/L (ref 3.5–5.2)
Sodium: 139 mmol/L (ref 134–144)
eGFR: 100 mL/min/{1.73_m2} (ref >59)

## 2021-08-13 ENCOUNTER — Telehealth (HOSPITAL_COMMUNITY): Payer: Self-pay | Admitting: *Deleted

## 2021-08-13 NOTE — Telephone Encounter (Signed)
Reaching out to patient to offer assistance regarding upcoming cardiac imaging study; pt verbalizes understanding of appt date/time, parking situation and where to check in, pre-test NPO status and medications ordered, and verified current allergies; name and call back number provided for further questions should they arise ° °Rolin Schult RN Navigator Cardiac Imaging °[facility] °[phone] office °[phone] cell ° °Patient to take 50mg metoprolol tartrate two hours prior to cardiac CT scan. She is aware to arrive at 8am for her 8:30am scan. °

## 2021-08-14 ENCOUNTER — Ambulatory Visit (HOSPITAL_COMMUNITY)
Admission: RE | Admit: 2021-08-14 | Discharge: 2021-08-14 | Disposition: A | Discharge status: Home / Self Care | Payer: Medicare Other | Source: Ambulatory Visit | Attending: Cardiology | Admitting: Cardiology

## 2021-08-14 ENCOUNTER — Encounter (HOSPITAL_COMMUNITY): Payer: Self-pay

## 2021-08-14 ENCOUNTER — Other Ambulatory Visit: Payer: Self-pay

## 2021-08-14 DIAGNOSIS — I2584 Coronary atherosclerosis due to calcified coronary lesion: Secondary | ICD-10-CM

## 2021-08-14 DIAGNOSIS — E785 Hyperlipidemia, unspecified: Secondary | ICD-10-CM | POA: Diagnosis present

## 2021-08-14 DIAGNOSIS — I2510 Atherosclerotic heart disease of native coronary artery without angina pectoris: Secondary | ICD-10-CM | POA: Diagnosis present

## 2021-08-14 DIAGNOSIS — I10 Essential (primary) hypertension: Secondary | ICD-10-CM

## 2021-08-14 MED ORDER — IOHEXOL 350 MG/ML SOLN
95.0000 mL | Freq: Once | INTRAVENOUS | Status: AC | PRN
  Administered 2021-08-14: 95 mL via INTRAVENOUS

## 2021-08-14 MED ORDER — NITROGLYCERIN 0.4 MG SL SUBL
SUBLINGUAL_TABLET | SUBLINGUAL | Status: AC
  Filled 2021-08-14: qty 2

## 2021-08-14 MED ORDER — NITROGLYCERIN 0.4 MG SL SUBL
0.8000 mg | SUBLINGUAL_TABLET | Freq: Once | SUBLINGUAL | Status: AC
  Administered 2021-08-14: 0.8 mg via SUBLINGUAL

## 2021-08-15 ENCOUNTER — Ambulatory Visit (HOSPITAL_COMMUNITY): Payer: Medicare Other | Attending: Cardiology

## 2021-08-15 ENCOUNTER — Ambulatory Visit (INDEPENDENT_AMBULATORY_CARE_PROVIDER_SITE_OTHER): Payer: Medicare Other | Admitting: Pharmacist

## 2021-08-15 DIAGNOSIS — R9389 Abnormal findings on diagnostic imaging of other specified body structures: Secondary | ICD-10-CM

## 2021-08-15 DIAGNOSIS — I10 Essential (primary) hypertension: Secondary | ICD-10-CM | POA: Insufficient documentation

## 2021-08-15 DIAGNOSIS — E785 Hyperlipidemia, unspecified: Secondary | ICD-10-CM | POA: Insufficient documentation

## 2021-08-15 DIAGNOSIS — I2584 Coronary atherosclerosis due to calcified coronary lesion: Secondary | ICD-10-CM | POA: Diagnosis present

## 2021-08-15 DIAGNOSIS — I2510 Atherosclerotic heart disease of native coronary artery without angina pectoris: Secondary | ICD-10-CM | POA: Insufficient documentation

## 2021-08-15 LAB — ECHOCARDIOGRAM COMPLETE
Area-P 1/2: 2.58 cm2
S' Lateral: 2.6 cm

## 2021-08-15 MED ORDER — ROSUVASTATIN CALCIUM 5 MG PO TABS: 5.0000 mg | ORAL_TABLET | Freq: Every day | ORAL | 3 refills | Status: DC

## 2021-08-15 NOTE — Patient Instructions (Addendum)
Please start taking rosuvastatin 5mg  daily Call me at [phone] Come for fasting labs on 2/23. I will call you when the labs come back to discuss.

## 2021-08-15 NOTE — Progress Notes (Signed)
Patient ID: Carol Gilbert                 DOB: 1951-07-15                    MRN: [id number]     HPI: Carol Gilbert is a 70 y.o. female patient referred to lipid clinic by Dr. Mayford Knife. 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 Her statin was stopped due to fibromyalgia.  A1C was 5.5 in Feb 2022. On no diabetes medications. LDL was 94 on 05/22/20. She had bariatric surgery 08/10/2018.   Patient presents to lipid clinic today. She speaks very good english and declines an interrupter. She states that she sees a functional medicine doctor Ferdinand Cava) who told her that her LDL particle size was small and statins do not make them bigger. Told her that her statin was lowering her coq10 and adding to her pain. Patient states that when she stopped the atorvastatin that she did feel a little better, but she also had made other changes too.   Current Medications: none Intolerances: atorvastatin 10mg  (muscle pain) Risk Factors: pre-diabetes, HTN, CAD on CT LDL goal: <70  Diet: not discussed today  Exercise: personal trainer twice a week, water aerobics 3 days a week  Family History: No family history on file.  Social History: no tobacco, wine every once in awhile, no illict drugs  Labs:05/07/21 LPL-P 627, LDL-C 52, HDL 93, TG 58, Tchol 157, LDL size 20.2 LP-IR 30 A1C 5.9 LPa 55.8 nmol/L Alice ingram, [address]  Past Medical History:  Diagnosis Date   Apnea    Arthritis    Asthma    Diabetes mellitus without complication (HCC)    no medications   Diverticulitis    Fibromyalgia    Hypertension     Current Outpatient Medications on File Prior to Visit  Medication Sig Dispense Refill   Ascorbic Acid (VITAMIN C) 1000 MG tablet Take 1,000 mg by mouth daily.     b complex vitamins tablet Take 1 tablet by mouth every evening.      Calcium-Phosphorus-Vitamin D (CITRACAL  +D3 PO) Take 1 tablet by mouth every evening.     cetirizine (ZYRTEC) 10 MG tablet Take 10 mg by mouth daily.     Cholecalciferol (VITAMIN D3) 125 MCG (5000 UT) CAPS Take 5,000 Units by mouth daily.      docusate sodium (COLACE) 100 MG capsule Take 300 mg by mouth at bedtime.     DULoxetine (CYMBALTA) 60 MG capsule Take 60 mg by mouth daily.     levalbuterol (XOPENEX HFA) 45 MCG/ACT inhaler Inhale 1-2 puffs into the lungs every 4 (four) hours as needed for wheezing. 1 each 3   losartan-hydrochlorothiazide (HYZAAR) 50-12.5 MG tablet Take 1 tablet by mouth daily.     meloxicam (MOBIC) 15 MG tablet Take 15 mg by mouth daily.     Methylcellulose, Laxative, (CITRUCEL) 500 MG TABS Take 500 mg by mouth 2 (two) times a day.      metoprolol tartrate (LOPRESSOR) 50 MG tablet Take 1 tablet (50 mg total) by mouth once for 1 dose. Take 1 tablet (50 mg total) two hours prior to CT scan. 1 tablet 0   montelukast (SINGULAIR) 10 MG tablet Take 10 mg by mouth daily.      MUCINEX MAXIMUM STRENGTH 1200 MG TB12 Take 1,200 mg by mouth every  evening. Takes every other day in the evening     Multiple Vitamin (MULTIVITAMIN WITH MINERALS) TABS tablet Take 1 tablet by mouth daily. One-A-Day Women's     POTASSIUM PO Take by mouth.     predniSONE (DELTASONE) 50 MG tablet Take 1 tablet 13 hours prior to CT scan. Take 1 tablet 7 hours prior to CT scan. Take 1 tablet 1 hour prior to CT scan. 3 tablet 0   SYMBICORT 160-4.5 MCG/ACT inhaler Inhale 2 puffs into the lungs 2 (two) times daily. 1 each 5   traMADol (ULTRAM) 50 MG tablet Take by mouth every 6 (six) hours as needed.     No current facility-administered medications on file prior to visit.    Allergies  Allergen Reactions   Shellfish Allergy Anaphylaxis   Pollen Extract Other (See Comments)    Dust and smoke/ causes watery eyes; sneezing    Assessment/Plan:  1. Hyperlipidemia - Patient's LDL-C on atorvastatin 10mg  daily was well controlled. Her LDL particle  number was very good at 627. A little discordant from her LDL-C of 52 but still low. Her LDL size was small but LDL-P is low. We discussed her labs and that LDL-P is more important than size. Not only should you try to increase size with exercise and diet but lowering the amount of LDL-P with statins or PCSK9i will decrease the atherogenicity of the small LDL. Therefore her statin was still playing a important roll in her cardiovascular risk reduction. LPa is normal.  We discussed both trying another statin (she only tried atorvastatin), PCSK9i and ezetimibe. Patient may be over the income limit for [employer]. We discussed that sometime insurance will want to see 2 statins tried. Not all statins are the same. Patient agreeable to trying rosuvastatin 5 mg daily. Will check NMR and apo B in 2 months.  Thank you,  , Pharm.D, BCPS, CPP [facility]  [address], [place], [place]  Phone: [phone]; Fax: [phone]

## 2021-08-21 ENCOUNTER — Telehealth: Payer: Self-pay

## 2021-08-21 MED ORDER — ASPIRIN EC 81 MG PO TBEC: 81.0000 mg | DELAYED_RELEASE_TABLET | Freq: Every day | ORAL | 3 refills | Status: DC

## 2021-08-21 NOTE — Telephone Encounter (Signed)
-----   Message from Antonieta Iba, RN sent at 08/15/2021 10:13 AM EST ----- Werner Lean, MD  Antonieta Iba, RN Mild non-obstructive CAD  - ASA 81 mg PO daily is reasonable  - pending lipid clinic eval.   Thanks,  MAC

## 2021-08-21 NOTE — Telephone Encounter (Signed)
The patient has been notified of the result and verbalized understanding.  All questions (if any) were answered. Theresia Majors, RN 08/21/2021 3:21 PM

## 2021-10-23 ENCOUNTER — Other Ambulatory Visit: Payer: Medicare Other

## 2021-10-27 ENCOUNTER — Other Ambulatory Visit: Payer: Self-pay

## 2021-10-27 ENCOUNTER — Ambulatory Visit (INDEPENDENT_AMBULATORY_CARE_PROVIDER_SITE_OTHER): Payer: Medicare Other | Admitting: Internal Medicine

## 2021-10-27 ENCOUNTER — Encounter: Payer: Self-pay | Admitting: Internal Medicine

## 2021-10-27 DIAGNOSIS — J4530 Mild persistent asthma, uncomplicated: Secondary | ICD-10-CM

## 2021-10-27 DIAGNOSIS — R051 Acute cough: Secondary | ICD-10-CM

## 2021-10-27 LAB — POC COVID19 BINAXNOW: SARS Coronavirus 2 Ag: NEGATIVE

## 2021-10-27 MED ORDER — AZITHROMYCIN 250 MG PO TABS: ORAL_TABLET | ORAL | 0 refills | Status: DC

## 2021-10-27 MED ORDER — ALBUTEROL SULFATE (2.5 MG/3ML) 0.083% IN NEBU: 2.5000 mg | INHALATION_SOLUTION | RESPIRATORY_TRACT | 1 refills | Status: DC | PRN

## 2021-10-27 NOTE — Progress Notes (Signed)
Carol Gilbert, female    DOB: 1951-01-12   MRN: [id number]   Brief patient profile:  16 yowf never smoker / asthmatic on [id number] self referred to pulmonary clinic 10/27/2021  for acute cough in setting of ? Viral URI with neg covid 19 testing same date (10/22/21)     History of Present Illness  10/27/2021  Pulmonary/  acute eval/Carol [place]  flew home from [place] 2[place]18 and  and sick since 2/22 Chief Complaint  Patient presents with   Acute Visit    Increased cough x 5 days, "felt feverish"- no fever, SOB. Her cough is non prod.   Dyspnea:  denies  Cough: non productive / harsh hacking  Sleep: 30 degrees electric SABA use: baseline 3 x per week hfa/ not neb  this  Started amox 2/16 s nasal symptoms at all   No obvious day to day or daytime variability or assoc excess/ purulent sputum or mucus plugs or hemoptysis or cp or chest tightness, subjective wheeze or overt sinus or hb symptoms.     Also denies any obvious fluctuation of symptoms with weather or environmental changes or other aggravating or alleviating factors except as outlined above   No unusual exposure hx or h/o childhood pna/ asthma or knowledge of premature birth.  Current Allergies, Complete Past Medical History, Past Surgical History, Family History, and Social History were reviewed in [employer] record.  ROS  The following are not active complaints unless bolded Hoarseness, sore throat, dysphagia, dental problems, itching, sneezing,  nasal congestion or discharge of excess mucus or purulent secretions, ear ache,   fever, chills, sweats, unintended wt loss or wt gain, classically pleuritic or exertional cp,  orthopnea pnd or arm/hand swelling  or leg swelling, presyncope, palpitations, abdominal pain, anorexia, nausea, vomiting, diarrhea  or change in bowel habits or change in bladder habits, change in stools or change in urine, dysuria, hematuria,  rash, arthralgias, visual complaints, headache,  numbness, weakness or ataxia or problems with walking or coordination,  change in mood or  memory.            Past Medical History:  Diagnosis Date   Apnea    Arthritis    Asthma    Diabetes mellitus without complication ([place])    no medications   Diverticulitis    Fibromyalgia    Hypertension     Outpatient Medications Prior to Visit  Medication Sig Dispense Refill   Ascorbic Acid (VITAMIN C) 1000 MG tablet Take 1,000 mg by mouth daily.     b complex vitamins tablet Take 1 tablet by mouth every evening.      Calcium-Phosphorus-Vitamin D (CITRACAL +D3 PO) Take 1 tablet by mouth every evening.     cetirizine (ZYRTEC) 10 MG tablet Take 10 mg by mouth daily.     Cholecalciferol (VITAMIN D3) 125 MCG (5000 UT) CAPS Take 5,000 Units by mouth daily.      docusate sodium (COLACE) 100 MG capsule Take 300 mg by mouth at bedtime.     DULoxetine (CYMBALTA) 60 MG capsule Take 60 mg by mouth daily.     levalbuterol (XOPENEX HFA) 45 MCG/ACT inhaler Inhale 1-2 puffs into the lungs every 4 (four) hours as needed for wheezing. 1 each 3   losartan-hydrochlorothiazide (HYZAAR) 50-12.5 MG tablet Take 1 tablet by mouth daily.     meloxicam (MOBIC) 15 MG tablet Take 15 mg by mouth daily.     metoprolol tartrate (LOPRESSOR) 50 MG tablet Take 1 tablet (  50 mg total) by mouth once for 1 dose. Take 1 tablet (50 mg total) two hours prior to CT scan. 1 tablet 0   montelukast (SINGULAIR) 10 MG tablet Take 10 mg by mouth daily.      MUCINEX MAXIMUM STRENGTH 1200 MG TB12 Take 1,200 mg by mouth every evening. Takes every other day in the evening     Multiple Vitamin (MULTIVITAMIN WITH MINERALS) TABS tablet Take 1 tablet by mouth daily. One-A-Day Women's     rosuvastatin (CRESTOR) 5 MG tablet Take 1 tablet (5 mg total) by mouth daily. 90 tablet 3   SYMBICORT 160-4.5 MCG/ACT inhaler Inhale 2 puffs into the lungs 2 (two) times daily. 1 each 5   traMADol (ULTRAM) 50 MG tablet Take by mouth every 6 (six) hours as needed.      aspirin EC 81 MG tablet Take 1 tablet (81 mg total) by mouth daily. Swallow whole. 90 tablet 3   Methylcellulose, Laxative, (CITRUCEL) 500 MG TABS Take 500 mg by mouth 2 (two) times a day.      POTASSIUM PO Take by mouth.     predniSONE (DELTASONE) 50 MG tablet Take 1 tablet 13 hours prior to CT scan. Take 1 tablet 7 hours prior to CT scan. Take 1 tablet 1 hour prior to CT scan. 3 tablet 0   No facility-administered medications prior to visit.     Objective:     Pulse 75    SpO2 97% Comment: on RA  SpO2: 97 % (on RA)  Amb wm upper airway pattern dry cough    HEENT : pt wearing mask not removed for exam due to covid -19 concerns.    NECK :  without JVD/Nodes/TM/ nl carotid upstrokes bilaterally   LUNGS: no acc muscle use,  Nl contour chest which is clear to A and P bilaterally without cough on insp or exp maneuvers   CV:  RRR  no s3 or murmur or increase in P2, and no edema   ABD:  soft and nontender with nl inspiratory excursion in the supine position. No bruits or organomegaly appreciated, bowel sounds nl  MS:  Nl gait/ ext warm without deformities, calf tenderness, cyanosis or clubbing No obvious joint restrictions   SKIN: warm and dry without lesions    NEURO:  alert, approp, nl sensorium with  no motor or cerebellar deficits apparent.    CXR PA and Lateral:   10/27/2021 :    I personally reviewed images and agree with radiology impression as follows:    Did not go for cxr as rec       Assessment   No problem-specific Assessment & Plan notes found for this encounter.     Carol Gully, MD 10/27/2021

## 2021-10-27 NOTE — Assessment & Plan Note (Addendum)
No evidence of flare but reviewed action plan with her should she experience more wheeze/ sob than is the present case  F/u as planned

## 2021-10-27 NOTE — Patient Instructions (Addendum)
Take delsym two tsp every 12 hours and supplement if needed with  tramadol 50 mg up to 1-2 every 4 hours to suppress the urge to cough. Swallowing water and/or using ice chips/non mint and menthol containing candies (such as lifesavers or sugarless jolly ranchers) are also effective.  You should rest your voice and avoid activities that you know make you cough.  Once you have eliminated the cough for 3 straight days try reducing the tramadol first,  then the delsym as tolerated.    Zpak   Please remember to go to the  x-ray department  for your tests - we will call you with the results when they are available    Keep follow up appt but call in meantime if not back to your usual state in a week.

## 2021-10-27 NOTE — Assessment & Plan Note (Addendum)
Onset p jet from [place] to [place]  10/18/21 and exp to sick relatives since arrival all tested neg for covid 19 as she did also today in setting of asthma s apparent flare   rx contineu symbicort but no need for systemic steroids here  zpak Cough suppression with delesym/tramadol  Keep regular f/u planned, call sooner if needed         Each maintenance medication was reviewed in detail including emphasizing most importantly the difference between maintenance and prns and under what circumstances the prns are to be triggered using an action plan format where appropriate.  Total time for H and P, chart review, counseling, reviewing hfa  device(s) and generating customized AVS unique to this office visit / same day charting  > 30 min with pt new to me

## 2021-10-29 ENCOUNTER — Encounter: Payer: Self-pay | Admitting: Internal Medicine

## 2021-11-04 ENCOUNTER — Other Ambulatory Visit: Payer: Medicare Other | Admitting: *Deleted

## 2021-11-04 ENCOUNTER — Other Ambulatory Visit: Payer: Self-pay

## 2021-11-04 DIAGNOSIS — E785 Hyperlipidemia, unspecified: Secondary | ICD-10-CM

## 2021-11-06 LAB — NMR, LIPOPROFILE
Cholesterol, Total: 157 mg/dL (ref 100–199)
HDL Particle Number: 48.5 umol/L (ref >=30.5)
HDL-C: 88 mg/dL (ref >39)
LDL Particle Number: 575 nmol/L (ref <1000)
LDL Size: 20.3 nm — ABNORMAL LOW (ref >20.5)
LDL-C (NIH Calc): 56 mg/dL (ref 0–99)
LP-IR Score: 36 (ref <=45)
Small LDL Particle Number: 379 nmol/L (ref <=527)
Triglycerides: 69 mg/dL (ref 0–149)

## 2021-11-06 LAB — APOLIPOPROTEIN B: Apolipoprotein B: 57 mg/dL (ref <90)

## 2021-12-19 ENCOUNTER — Encounter (HOSPITAL_BASED_OUTPATIENT_CLINIC_OR_DEPARTMENT_OTHER): Payer: Self-pay | Admitting: *Deleted

## 2021-12-19 ENCOUNTER — Other Ambulatory Visit: Payer: Self-pay

## 2021-12-19 ENCOUNTER — Emergency Department (HOSPITAL_BASED_OUTPATIENT_CLINIC_OR_DEPARTMENT_OTHER)
Admission: EM | Admit: 2021-12-19 | Discharge: 2021-12-19 | Disposition: A | Discharge status: Home / Self Care | Payer: Medicare Other | Attending: Emergency Medicine | Admitting: Emergency Medicine

## 2021-12-19 ENCOUNTER — Emergency Department (HOSPITAL_BASED_OUTPATIENT_CLINIC_OR_DEPARTMENT_OTHER): Payer: Medicare Other

## 2021-12-19 DIAGNOSIS — E119 Type 2 diabetes mellitus without complications: Secondary | ICD-10-CM | POA: Insufficient documentation

## 2021-12-19 DIAGNOSIS — J45909 Unspecified asthma, uncomplicated: Secondary | ICD-10-CM | POA: Diagnosis not present

## 2021-12-19 DIAGNOSIS — R1031 Right lower quadrant pain: Secondary | ICD-10-CM | POA: Diagnosis present

## 2021-12-19 DIAGNOSIS — I10 Essential (primary) hypertension: Secondary | ICD-10-CM | POA: Insufficient documentation

## 2021-12-19 LAB — COMPREHENSIVE METABOLIC PANEL
ALT: 17 U/L (ref 0–44)
AST: 18 U/L (ref 15–41)
Albumin: 3.4 g/dL — ABNORMAL LOW (ref 3.5–5.0)
Alkaline Phosphatase: 68 U/L (ref 38–126)
Anion gap: 6 (ref 5–15)
BUN: 17 mg/dL (ref 8–23)
CO2: 28 mmol/L (ref 22–32)
Calcium: 9.7 mg/dL (ref 8.9–10.3)
Chloride: 105 mmol/L (ref 98–111)
Creatinine, Ser: 0.49 mg/dL (ref 0.44–1.00)
GFR, Estimated: >60 mL/min (ref >60)
Glucose, Bld: 113 mg/dL — ABNORMAL HIGH (ref 70–99)
Potassium: 3.6 mmol/L (ref 3.5–5.1)
Sodium: 139 mmol/L (ref 135–145)
Total Bilirubin: 0.3 mg/dL (ref 0.3–1.2)
Total Protein: 6.2 g/dL — ABNORMAL LOW (ref 6.5–8.1)

## 2021-12-19 LAB — URINALYSIS, ROUTINE W REFLEX MICROSCOPIC
Bilirubin Urine: NEGATIVE
Glucose, UA: NEGATIVE mg/dL
Hgb urine dipstick: NEGATIVE
Ketones, ur: NEGATIVE mg/dL
Leukocytes,Ua: NEGATIVE
Nitrite: NEGATIVE
Protein, ur: NEGATIVE mg/dL
Specific Gravity, Urine: 1.02 (ref 1.005–1.030)
pH: 5.5 (ref 5.0–8.0)

## 2021-12-19 LAB — CBC
HCT: 40.3 % (ref 36.0–46.0)
Hemoglobin: 13 g/dL (ref 12.0–15.0)
MCH: 29.7 pg (ref 26.0–34.0)
MCHC: 32.3 g/dL (ref 30.0–36.0)
MCV: 92 fL (ref 80.0–100.0)
Platelets: 266 10*3/uL (ref 150–400)
RBC: 4.38 MIL/uL (ref 3.87–5.11)
RDW: 14.1 % (ref 11.5–15.5)
WBC: 8.6 10*3/uL (ref 4.0–10.5)
nRBC: 0 % (ref 0.0–0.2)

## 2021-12-19 LAB — LIPASE, BLOOD: Lipase: 30 U/L (ref 11–51)

## 2021-12-19 MED ORDER — IOHEXOL 300 MG/ML  SOLN
100.0000 mL | Freq: Once | INTRAMUSCULAR | Status: AC | PRN
  Administered 2021-12-19: 100 mL via INTRAVENOUS

## 2021-12-19 MED ORDER — DICLOFENAC SODIUM 1 % EX GEL: 2.0000 g | Freq: Four times a day (QID) | CUTANEOUS | 0 refills | Status: DC | PRN

## 2021-12-19 NOTE — ED Notes (Signed)
Patient transported to CT 

## 2021-12-19 NOTE — ED Provider Notes (Signed)
? ?Emergency Department Provider Note ? ? ?I have reviewed the triage vital signs and the nursing notes. ? ? ?HISTORY ? ?Chief Complaint ?Abdominal Pain ? ? ?HPI ?Carol Gilbert is a 71 y.o. female with past history of diabetes, hypertension presents emergency department with right flank and abdominal pain.  Patient notes prior history of appendectomy and cholecystectomy.  She developed pain in the right lower abdomen, worse with leaning forward initially thought it might be musculoskeletal.  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  She does not recall pain with lifting or other obvious injury.  She noticed the pain was getting worse and became concerned because she does not have help on the weekends to come to the hospital to be evaluated.  No fevers.  No dysuria, hesitancy, urgency.  No vaginal bleeding or discharge. ? ? ?Past Medical History:  ?Diagnosis Date  ? Apnea   ? Arthritis   ? Asthma   ? Diabetes mellitus without complication (HCC)   ? no medications  ? Diverticulitis   ? Fibromyalgia   ? Hypertension   ? ? ?Review of Systems ? ?Constitutional: No fever/chills ?Eyes: No visual changes. ?ENT: No sore throat. ?Cardiovascular: Denies chest pain. ?Respiratory: Denies shortness of breath. ?Gastrointestinal: Positive abdominal pain.  No nausea, no vomiting.  No diarrhea.  No constipation. ?Genitourinary: Negative for dysuria. ?Musculoskeletal: Negative for back pain. ?Skin: Negative for rash. ?Neurological: Negative for headaches, focal weakness or numbness. ? ? ?____________________________________________ ? ? ?PHYSICAL EXAM: ? ?VITAL SIGNS: ?ED Triage Vitals  ?Enc Vitals Group  ?   BP 12/19/21 1315 (!) 142/79  ?   Pulse Rate 12/19/21 1315 66  ?   Resp 12/19/21 1315 (!) 36  ?   Temp 12/19/21 1315 98.1 ?F (36.7 ?C)  ?   Temp Source 12/19/21 1315 Oral  ?   SpO2 12/19/21 1315 100 %  ?   Weight 12/19/21 1313 225 lb (102.1 kg)  ?   Height 12/19/21 1313 5\' 1"  (1.549 m)  ? ?Constitutional: Alert and  oriented. Well appearing and in no acute distress. ?Eyes: Conjunctivae are normal.  ?Head: Atraumatic. ?Nose: No congestion/rhinnorhea. ?Mouth/Throat: Mucous membranes are moist.   ?Neck: No stridor.  ?Cardiovascular: Normal rate, regular rhythm. Good peripheral circulation. Grossly normal heart sounds.   ?Respiratory: Normal respiratory effort.  No retractions. Lungs CTAB. ?Gastrointestinal: Soft with mild RLQ tenderness. No distention.  ?Musculoskeletal: No lower extremity tenderness nor edema. No gross deformities of extremities. ?Neurologic:  Normal speech and language. No gross focal neurologic deficits are appreciated.  ?Skin:  Skin is warm, dry and intact. No rash noted. ? ?____________________________________________ ?  ?LABS ?(all labs ordered are listed, but only abnormal results are displayed) ? ?Labs Reviewed  ?COMPREHENSIVE METABOLIC PANEL - Abnormal; Notable for the following components:  ?    Result Value  ? Glucose, Bld 113 (*)   ? Total Protein 6.2 (*)   ? Albumin 3.4 (*)   ? All other components within normal limits  ?LIPASE, BLOOD  ?CBC  ?URINALYSIS, ROUTINE W REFLEX MICROSCOPIC  ? ? ?____________________________________________ ? ? ?PROCEDURES ? ?Procedure(s) performed:  ? ?Procedures ? ?None  ?____________________________________________ ? ? ?INITIAL IMPRESSION / ASSESSMENT AND PLAN / ED COURSE ? ?Pertinent labs & imaging results that were available during my care of the patient were reviewed by me and considered in my medical decision making (see chart for details). ?  ?This patient is Presenting for Evaluation of abdominal pain, which does require a range  of treatment options, and is a complaint that involves a high risk of morbidity and mortality. ? ?The Differential Diagnoses  includes but is not exclusive to acute cholecystitis, intrathoracic causes for epigastric abdominal pain, gastritis, duodenitis, pancreatitis, small bowel or large bowel obstruction, abdominal aortic aneurysm, hernia,  gastritis, etc. ? ?  ?Clinical Laboratory Tests Ordered, included CBC without leukocytosis.  No acute kidney injury.  Lipase and UA are negative. ? ?Radiologic Tests Ordered, included CT abdomen/pelvis. I independently interpreted the images and agree with radiology interpretation.  ? ?Cardiac Monitor Tracing which shows NSR. ? ? ?Social Determinants of Health Risk no smoking history.  ? ?Medical Decision Making: Summary:  ?Patient presents to the emergency department for evaluation of abdominal pain in the right lower quadrant.  Patient has prior history of cholecystectomy and appendectomy but does have some mild tenderness on exam.  No peritonitis. Plan for CT and reassess. ? ?Care transferred to Dr. Kirtland Bouchard. [place] pending CT.  ? ?Reevaluation with update and discussion with patient. Discussed results and follow up plan.  ? ?Disposition: pending ? ?____________________________________________ ? ?FINAL CLINICAL IMPRESSION(S) / ED DIAGNOSES ? ?Final diagnoses:  ?RLQ abdominal pain  ? ? ? ?NEW OUTPATIENT MEDICATIONS STARTED DURING THIS VISIT: ? ?Discharge Medication List as of 12/19/2021  4:44 PM  ?  ? ?START taking these medications  ? Details  ?diclofenac Sodium (VOLTAREN) 1 % GEL Apply 2 g topically 4 (four) times daily as needed., Starting Fri 12/19/2021, Normal  ?  ?  ? ? ?Note:  This document was prepared using Dragon voice recognition software and may include unintentional dictation errors. ? ?Alona Bene, MD, FACEP ?Emergency Medicine ? ?  ?Maia Plan, MD ?12/22/21 [phone] ? ?

## 2021-12-19 NOTE — ED Provider Notes (Signed)
Patient signed out to me by previous provider. Please refer to their note for full HPI.  Briefly this is a 71 year old female who presented for right-sided abdominal pain.  Work-up thus far is benign, pending results of CT of the abdomen pelvis.  Presumed musculoskeletal etiology. ? ?CT of the abdomen pelvis and advise no acute pathology.  Patient feels well, benign abdomen on exam.  Plan for outpatient follow-up.  Patient at this time appears safe and stable for discharge and close outpatient follow up. Discharge plan and strict return to ED precautions discussed, patient verbalizes understanding and agreement. ?  Rozelle Logan, DO ?12/19/21 1654 ? ?

## 2021-12-19 NOTE — ED Triage Notes (Signed)
Pt reports RLQ pain with tenderness since Tuesday. States she has been having some constipated BMs ?

## 2021-12-19 NOTE — Discharge Instructions (Signed)

## 2021-12-25 ENCOUNTER — Ambulatory Visit (INDEPENDENT_AMBULATORY_CARE_PROVIDER_SITE_OTHER): Payer: Medicare Other | Admitting: Adult Health

## 2021-12-25 ENCOUNTER — Encounter: Payer: Self-pay | Admitting: Adult Health

## 2021-12-25 VITALS — BP 110/80 | HR 85 | Temp 98.2°F | Ht 60.0 in | Wt 230.2 lb

## 2021-12-25 DIAGNOSIS — G4733 Obstructive sleep apnea (adult) (pediatric): Secondary | ICD-10-CM | POA: Diagnosis not present

## 2021-12-25 DIAGNOSIS — J310 Chronic rhinitis: Secondary | ICD-10-CM | POA: Diagnosis not present

## 2021-12-25 DIAGNOSIS — J4530 Mild persistent asthma, uncomplicated: Secondary | ICD-10-CM | POA: Diagnosis not present

## 2021-12-25 NOTE — Assessment & Plan Note (Signed)
Recent exacerbation now improved.  Patient is having some ongoing  chronic rhinitis symptoms. ?Continue on current maintenance regimen follow-up with ENT as planned ? ?Plan  ?Patient Instructions  ?Continue on Symbicort 2 puffs Twice daily , rinse after use.  ?Continue on Singulair 10mg  At bedtime  .  ?Albuterol inhaler As needed  Wheezing .  ?Claritin 10mg  daily as needed.  ?May try Afrin Nasal Twice daily  for 2 days only.  ?Flonase nasal daily  ?Saline nasal rinses Twice daily   ?Saline nasal gel At bedtime   ?Activity as tolerated.  ?Continue on CPAP At bedtime   ?Travel Mini CPAP order . (Can try [email or web address])   ?Work on healthy weight.  ?Follow up in 6 months with Dr. Chase Caller or Areta Terwilliger NP and As needed .  ?Please contact office for sooner follow up if symptoms do not improve or worsen or seek emergency care  ? ? ? ?  ? ?

## 2021-12-25 NOTE — Assessment & Plan Note (Signed)
Excellent control and compliance on CPAP.  Will order CPAP mini once patient finds the company she wants to order from.  She is looking at [employer] CPAP Mini ? ?Plan  ?Patient Instructions  ?Continue on Symbicort 2 puffs Twice daily , rinse after use.  ?Continue on Singulair 10mg  At bedtime  .  ?Albuterol inhaler As needed  Wheezing .  ?Claritin 10mg  daily as needed.  ?May try Afrin Nasal Twice daily  for 2 days only.  ?Flonase nasal daily  ?Saline nasal rinses Twice daily   ?Saline nasal gel At bedtime   ?Activity as tolerated.  ?Continue on CPAP At bedtime   ?Travel Mini CPAP order . (Can try [email or web address])   ?Work on healthy weight.  ?Follow up in 6 months with Dr. or Iveliz Garay NP and As needed .  ?Please contact office for sooner follow up if symptoms do not improve or worsen or seek emergency care  ? ? ? ?  ? ?

## 2021-12-25 NOTE — Patient Instructions (Addendum)
Continue on Symbicort 2 puffs Twice daily , rinse after use.  ?Continue on Singulair 10mg  At bedtime  .  ?Albuterol inhaler As needed  Wheezing .  ?Claritin 10mg  daily as needed.  ?May try Afrin Nasal Twice daily  for 2 days only.  ?Flonase nasal daily  ?Saline nasal rinses Twice daily   ?Saline nasal gel At bedtime   ?Activity as tolerated.  ?Continue on CPAP At bedtime   ?Travel Mini CPAP order . (Can try [email or web address])   ?Work on healthy weight.  ?Follow up in 6 months with Dr. or Hollie Bartus NP and As needed .  ?Please contact office for sooner follow up if symptoms do not improve or worsen or seek emergency care  ? ? ? ?

## 2021-12-25 NOTE — Assessment & Plan Note (Signed)
Mild flare in symptoms.  Patient is continue on maintenance regimen.  ENT follow-up as planned ?

## 2021-12-25 NOTE — Progress Notes (Signed)
? ?@Patient  ID: Carol Gilbert, female    DOB: 31-Dec-1950, 71 y.o.   MRN: [id number] ? ?Chief Complaint  ?Patient presents with  ? Follow-up  ? ? ?Referring provider: ?Stevphen RochesterManfredi, Brenda L, MD ? ?HPI: ?71 yo female never smoker followed for moderate persistent asthma, chronic rhinitis, and OSA , pulmonary nodules.  ?Morbid obesity s/p bariatric surgery in 2019  ?From [place]  ? ?TEST/EVENTS :  ?FENO 08/2018 -10 ppb  ?IgE 09/19/18  -76 ?Eosinophils 100 ?  ?HST 10/25/18 - AHI 6.8, SpO2 low 87% ? ? serial CT chest.  CT chest May 20, 2020 showed pulmonary nodules that were stable to decreased in size consistent with a benign etiology ? ?12/25/2021 Follow up : Asthma , chronic rhinitis , OSA  ?Patient returns for a 3630-month follow-up.  Patient was seen last visit for an asthmatic bronchitic exacerbation.  She was treated with a Z-Pak.  Patient says symptoms are improved with decreased cough and congestion.  Does complain that she has had ongoing postnasal drainage nasal stuffiness and ear pressure.  She has been taking her allergy maintenance regimen with Flonase, Claritin and saline nasal rinses.  Patient has been referred to ENT by her primary care provider.  She denies any fever, discolored mucus, dental pain. ? ?She remains on Symbicort twice daily.  No increased albuterol use.  Activity tolerance is at baseline. ? ?Patient remains on CPAP at bedtime.  Patient says she wears her CPAP every night.  Does not miss any nights.  Typically gets in about 6 to 7 hours each night.  CPAP download shows excellent compliance with daily average usage around 7 to 8 hours.  AHI 2.7.  Patient is on CPAP AutoSet 5 to 15 cm H2O.   ?Patient does want to get a travel CPAP mini.  She says she travels to [place] often to see her family.  Patient is going to decide where she is going to buy this and will get back to us for a prescription. ? ? ?Allergies  ?Allergen Reactions  ? Shellfish Allergy Anaphylaxis  ? Gluten Meal  Other (See Comments)  ? Pollen Extract Other (See Comments)  ?  Dust and smoke/ causes watery eyes; sneezing  ? ? ?Immunization History  ?Administered Date(s) Administered  ? Fluad Quad(high Dose 65+) 05/22/2019, 05/27/2021  ? Influenza, High Dose Seasonal PF 05/06/2018, 05/22/2019, 05/22/2020  ? Influenza, Seasonal, Injecte, Preservative Fre 05/18/2017  ? PFIZER(Purple Top)SARS-COV-2 Vaccination 11/05/2019, 12/05/2019, 06/07/2020, 12/21/2020  ? Pneumococcal Conjugate-13 08/25/2018  ? Pneumococcal Polysaccharide-23 08/02/2017  ? Td 03/07/2019, 04/01/2019  ? Tdap 05/28/2020  ? Zoster Recombinat (Shingrix) 03/05/2019, 05/06/2019  ? ? ?Past Medical History:  ?Diagnosis Date  ? Apnea   ? Arthritis   ? Asthma   ? Diabetes mellitus without complication (HCC)   ? no medications  ? Diverticulitis   ? Fibromyalgia   ? Hypertension   ? ? ?Tobacco History: ?Social History  ? ?Tobacco Use  ?Smoking Status Never  ?Smokeless Tobacco Never  ? ?Counseling given: Not Answered ? ? ?Outpatient Medications Prior to Visit  ?Medication Sig Dispense Refill  ? albuterol (PROVENTIL) (2.5 MG/3ML) 0.083% nebulizer solution Take 3 mLs (2.5 mg total) by nebulization every 4 (four) hours as needed. 75 mL 1  ? Ascorbic Acid (VITAMIN C) 1000 MG tablet Take 1,000 mg by mouth daily.    ? b complex vitamins tablet Take 1 tablet by mouth every evening.     ? Calcium-Phosphorus-Vitamin D (CITRACAL +D3 PO) Take  1 tablet by mouth every evening.    ? cetirizine (ZYRTEC) 10 MG tablet Take 10 mg by mouth daily.    ? Cholecalciferol (VITAMIN D3) 125 MCG (5000 UT) CAPS Take 5,000 Units by mouth daily.     ? docusate sodium (COLACE) 100 MG capsule Take 300 mg by mouth at bedtime.    ? DULoxetine (CYMBALTA) 60 MG capsule Take 60 mg by mouth daily.    ? levalbuterol (XOPENEX HFA) 45 MCG/ACT inhaler Inhale 1-2 puffs into the lungs every 4 (four) hours as needed for wheezing. 1 each 3  ? losartan-hydrochlorothiazide (HYZAAR) 50-12.5 MG tablet Take 1 tablet by  mouth daily.    ? meloxicam (MOBIC) 15 MG tablet Take 15 mg by mouth daily.    ? montelukast (SINGULAIR) 10 MG tablet Take 10 mg by mouth daily.     ? MUCINEX MAXIMUM STRENGTH 1200 MG TB12 Take 1,200 mg by mouth every evening. Takes every other day in the evening    ? Multiple Vitamin (MULTIVITAMIN WITH MINERALS) TABS tablet Take 1 tablet by mouth daily. One-A-Day Women's    ? SYMBICORT 160-4.5 MCG/ACT inhaler Inhale 2 puffs into the lungs 2 (two) times daily. 1 each 5  ? azithromycin (ZITHROMAX) 250 MG tablet Take 2 on day one then 1 daily x 4 days 6 tablet 0  ? diclofenac Sodium (VOLTAREN) 1 % GEL Apply 2 g topically 4 (four) times daily as needed. (Patient not taking: Reported on 12/25/2021) 100 g 0  ? metoprolol tartrate (LOPRESSOR) 50 MG tablet Take 1 tablet (50 mg total) by mouth once for 1 dose. Take 1 tablet (50 mg total) two hours prior to CT scan. 1 tablet 0  ? rosuvastatin (CRESTOR) 5 MG tablet Take 1 tablet (5 mg total) by mouth daily. 90 tablet 3  ? traMADol (ULTRAM) 50 MG tablet Take by mouth every 6 (six) hours as needed. (Patient not taking: Reported on 12/25/2021)    ? ?No facility-administered medications prior to visit.  ? ? ? ?Review of Systems:  ? ?Constitutional:   No  weight loss, night sweats,  Fevers, chills,  ?+fatigue, or  lassitude. ? ?HEENT:   No headaches,  Difficulty swallowing,  Tooth/dental problems, or  Sore throat,  ?              No sneezing, itching, ear ache,  ?+nasal congestion, post nasal drip,  ? ?CV:  No chest pain,  Orthopnea, PND, swelling in lower extremities, anasarca, dizziness, palpitations, syncope.  ? ?GI  No heartburn, indigestion, abdominal pain, nausea, vomiting, diarrhea, change in bowel habits, loss of appetite, bloody stools.  ? ?Resp:   No excess mucus, no productive cough,  No non-productive cough,  No coughing up of blood.  No change in color of mucus.  No wheezing.  No chest wall deformity ? ?Skin: no rash or lesions. ? ?GU: no dysuria, change in color of  urine, no urgency or frequency.  No flank pain, no hematuria  ? ?MS:  No joint pain or swelling.  No decreased range of motion.  No back pain. ? ? ? ?Physical Exam ? ?BP 110/80 (BP Location: Left Wrist, Patient Position: Sitting, Cuff Size: Normal)   Pulse 85   Temp 98.2 ?F (36.8 ?C) (Oral)   Ht 5' (1.524 m)   Wt 230 lb 3.2 oz (104.4 kg)   SpO2 96%   BMI 44.96 kg/m?  ? ?GEN: A/Ox3; pleasant , NAD, well nourished  ?  ?HEENT:  [place]/AT,  EACs-clear, TMs-wnl, NOSE-clear, THROAT-clear, no lesions, no postnasal drip or exudate noted.  ? ?NECK:  Supple w/ fair ROM; no JVD; normal carotid impulses w/o bruits; no thyromegaly or nodules palpated; no lymphadenopathy.   ? ?RESP  Clear  P & A; w/o, wheezes/ rales/ or rhonchi. no accessory muscle use, no dullness to percussion ? ?CARD:  RRR, no m/r/g, tr  peripheral edema, pulses intact, no cyanosis or clubbing. ? ?GI:   Soft & nt; nml bowel sounds; no organomegaly or masses detected.  ? ?Musco: Warm bil, no deformities or joint swelling noted.  ? ?Neuro: alert, no focal deficits noted.   ? ?Skin: Warm, no lesions or rashes ? ? ? ?Lab Results: ? ?CBC ?   ?Component Value Date/Time  ? WBC 8.6 12/19/2021 1405  ? RBC 4.38 12/19/2021 1405  ? HGB 13.0 12/19/2021 1405  ? HCT 40.3 12/19/2021 1405  ? PLT 266 12/19/2021 1405  ? MCV 92.0 12/19/2021 1405  ? MCH 29.7 12/19/2021 1405  ? MCHC 32.3 12/19/2021 1405  ? RDW 14.1 12/19/2021 1405  ? LYMPHSABS 2.2 10/13/2019 1412  ? MONOABS 0.4 10/13/2019 1412  ? EOSABS 0.1 10/13/2019 1412  ? BASOSABS 0.1 10/13/2019 1412  ? ? ?BMET ?   ?Component Value Date/Time  ? NA 139 12/19/2021 1405  ? NA 139 07/28/2021 1547  ? K 3.6 12/19/2021 1405  ? CL 105 12/19/2021 1405  ? CO2 28 12/19/2021 1405  ? GLUCOSE 113 (H) 12/19/2021 1405  ? BUN 17 12/19/2021 1405  ? BUN 21 07/28/2021 1547  ? CREATININE 0.49 12/19/2021 1405  ? CALCIUM 9.7 12/19/2021 1405  ? GFRNONAA >60 12/19/2021 1405  ? GFRAA >60 10/13/2019 1412  ? ? ?BNP ?No results found for:  BNP ? ?ProBNP ?No results found for: PROBNP ? ?Imaging: ? ? ? ? ?  Latest Ref Rng & Units 11/01/2018  ?  8:45 AM  ?PFT Results  ?FVC-Pre L 2.36    ?FVC-Predicted Pre % 85    ?FVC-Post L 2.38    ?FVC-Predicted Post % 85    ?Pre FEV1/F

## 2021-12-26 ENCOUNTER — Ambulatory Visit: Payer: Medicare Other | Admitting: Adult Health

## 2022-01-30 MED ORDER — LEVALBUTEROL TARTRATE 45 MCG/ACT IN AERO: 1.0000 | INHALATION_SPRAY | RESPIRATORY_TRACT | 3 refills | Status: DC | PRN

## 2022-04-27 ENCOUNTER — Telehealth: Payer: Self-pay | Admitting: Adult Health

## 2022-04-27 DIAGNOSIS — G4733 Obstructive sleep apnea (adult) (pediatric): Secondary | ICD-10-CM

## 2022-04-29 NOTE — Telephone Encounter (Signed)
Lm x1 for patient.  

## 2022-04-30 NOTE — Telephone Encounter (Signed)
Spoke with the pt  She ordered a travel CPAP online from the [facility]  She needs [place] to fax order over for it- fax- [phone] order # [id number] [id number] Will fax once TP has signed

## 2022-04-30 NOTE — Telephone Encounter (Signed)
Patient is returning a call to [facility].  Please call patient to discuss at [phone]

## 2022-04-30 NOTE — Telephone Encounter (Signed)
Called pt and there was no answer-LMTCB °

## 2022-05-05 ENCOUNTER — Telehealth: Payer: Self-pay | Admitting: Adult Health

## 2022-05-08 NOTE — Telephone Encounter (Signed)
April 30, 2022 Carol Gilbert, [facility]      04/30/22  1:42 PM Note Spoke with the pt  She ordered a travel CPAP online from the [facility]  She needs [place] to fax order over for it- fax- [phone] order # [id number] Will fax once TP has signed      Heather/Tammy, please advise if you know if this was taken care of.

## 2022-05-12 NOTE — Telephone Encounter (Signed)
Called and spoke with patient, she states that she has everything she needs.  I advised that an order was sent to her DME, [facility] in April, however, she was unable to afford the travel CPAP mini.  She ended up getting one through [facility] because she found one on sale.  Nothing further needed.

## 2022-06-18 ENCOUNTER — Ambulatory Visit (INDEPENDENT_AMBULATORY_CARE_PROVIDER_SITE_OTHER): Payer: Medicare Other | Admitting: Internal Medicine

## 2022-06-18 ENCOUNTER — Encounter: Payer: Self-pay | Admitting: Internal Medicine

## 2022-06-18 VITALS — BP 108/72 | HR 85 | Temp 98.5°F | Ht 61.0 in | Wt 230.2 lb

## 2022-06-18 DIAGNOSIS — J4540 Moderate persistent asthma, uncomplicated: Secondary | ICD-10-CM | POA: Diagnosis not present

## 2022-06-18 DIAGNOSIS — Z91013 Allergy to seafood: Secondary | ICD-10-CM | POA: Diagnosis not present

## 2022-06-18 MED ORDER — EPINEPHRINE 0.3 MG/0.3ML IJ SOAJ: 0.3000 mg | Freq: Once | INTRAMUSCULAR | 2 refills | Status: AC
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
## 2022-06-18 NOTE — Patient Instructions (Addendum)
ICD-10-CM   1. Asthma, well controlled, moderate persistent  J45.40     2. Shellfish allergy  Z91.013        Moderate persistent asthma, unspecified whether complicated  -Stable and well controlled.  Plan -Continue Symbicort scheduled, Singulair and Zyrtec - Take EpiPen prescription for emergency use in the case of shellfish allergy -Glad you had your flu shot and COVID shot - Please make sure you get RSV vaccine as soon as insurance pays for it.   OSA on CPAP -per your sleep doctor  Followu[ - Dr Chase Caller 6 months or sooner

## 2022-06-26 ENCOUNTER — Ambulatory Visit: Payer: Medicare Other | Admitting: Internal Medicine

## 2022-10-16 ENCOUNTER — Encounter: Payer: Self-pay | Admitting: Cardiology

## 2022-10-16 ENCOUNTER — Ambulatory Visit: Payer: Medicare Other | Attending: Cardiology | Admitting: Cardiology

## 2022-10-16 VITALS — BP 124/74 | HR 73 | Ht 61.0 in | Wt 241.0 lb

## 2022-10-16 DIAGNOSIS — I10 Essential (primary) hypertension: Secondary | ICD-10-CM | POA: Diagnosis present

## 2022-10-16 DIAGNOSIS — I2583 Coronary atherosclerosis due to lipid rich plaque: Secondary | ICD-10-CM | POA: Insufficient documentation

## 2022-10-16 DIAGNOSIS — I2510 Atherosclerotic heart disease of native coronary artery without angina pectoris: Secondary | ICD-10-CM | POA: Diagnosis present

## 2022-10-16 DIAGNOSIS — E785 Hyperlipidemia, unspecified: Secondary | ICD-10-CM | POA: Diagnosis present

## 2022-10-16 MED ORDER — ASPIRIN 81 MG PO TBEC: 81.0000 mg | DELAYED_RELEASE_TABLET | Freq: Every day | ORAL | 3 refills | Status: DC

## 2022-10-16 NOTE — Addendum Note (Signed)
Addended by: Joni Reining on: 10/16/2022 03:12 PM   Modules accepted: Orders

## 2022-10-16 NOTE — Patient Instructions (Signed)
Medication Instructions:  Please START taking a baby aspirin (81 mg) every day.   *If you need a refill on your cardiac medications before your next appointment, please call your pharmacy*   Lab Work: Please complete a FASTING lipid panel and an ALT level in 6 weeks. You can schedule this before you leave our office.  If you have labs (blood work) drawn today and your tests are completely normal, you will receive your results only by: [place] (if you have MyChart) OR A paper copy in the mail If you have any lab test that is abnormal or we need to change your treatment, we will call you to review the results.   Testing/Procedures: None.   Follow-Up: At [facility], you and your health needs are our priority.  As part of our continuing mission to provide you with exceptional heart care, we have created designated Provider Care Teams.  These Care Teams include your primary Cardiologist (physician) and Advanced Practice Providers (APPs -  Physician Assistants and Nurse Practitioners) who all work together to provide you with the care you need, when you need it.  We recommend signing up for the patient portal called "MyChart".  Sign up information is provided on this After Visit Summary.  MyChart is used to connect with patients for Virtual Visits (Telemedicine).  Patients are able to view lab/test results, encounter notes, upcoming appointments, etc.  Non-urgent messages can be sent to your provider as well.   To learn more about what you can do with MyChart, go to [email or web address].    Your next appointment:   1 year(s)  Provider:   Fransico Him, MD

## 2022-10-16 NOTE — Progress Notes (Signed)
Cardiology Note    Date:  10/16/2022   ID:  Carol Gilbert, DOB 02-Nov-1950, MRN [id number]  PCP:  Jolinda Croak, MD  Cardiologist:  Fransico Him, MD   Chief Complaint  Patient presents with   Coronary Artery Disease   Hypertension   Hyperlipidemia     History of Present Illness:  Carol Gilbert is a 72 y.o. female  with a hx of asthma, DM, fibromyalgia and HTN.  She is followed by pulmonary for her moderate persistent asthma with chronic rhinitis, OSA and pulmonary nodules.  She has morbid obesity and had bariatric surgery in 2019.  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  It was recommended that she go on an aspirin 81 mg daily. She was seen in lipid clinic and tried crestor and stopped it due to muscle pain.  She now restarted her old simvastatin and has been tolerating it.   She is here today for followup and is doing well.  She denies any chest pain or pressure, SOB, DOE, PND, orthopnea, LE edema (except when she was in [place]), dizziness, palpitations or syncope. She is compliant with her meds and is tolerating meds with no SE.    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   Diabetes mellitus without complication ([place])    no medications   Diverticulitis    Fibromyalgia    Hyperlipidemia with target LDL less than 70     Hypertension     Past Surgical History:  Procedure Laterality Date   ABDOMINAL HYSTERECTOMY     ABDOMINAL SURGERY     APPENDECTOMY     BARIATRIC SURGERY N/A 08/10/2018   BREAST BIOPSY     CESAREAN SECTION     CHOLECYSTECTOMY     HERNIA REPAIR     SINUS SURGERY WITH INSTATRAK     TOTAL KNEE ARTHROPLASTY Left 04/03/2019   Procedure: Left Knee Arthroplasty;  Surgeon: Frederik Pear, MD;  Location: [facility];  Service: Orthopedics;  Laterality: Left;   TOTAL SHOULDER ARTHROPLASTY Left 10/19/2019   Procedure: TOTAL SHOULDER ARTHROPLASTY;  Surgeon: Tania Ade, MD;  Location: [facility];  Service: Orthopedics;  Laterality: Left;   WISDOM TOOTH EXTRACTION      Current Medications: Current Meds  Medication Sig   albuterol (PROVENTIL) (2.5 MG/3ML) 0.083% nebulizer solution Take 3 mLs (2.5 mg total) by nebulization every 4 (four) hours as needed.   Ascorbic Acid (VITAMIN C) 1000 MG tablet Take 1,000 mg by mouth daily.   b complex vitamins tablet Take 1 tablet by mouth every evening.    Calcium-Phosphorus-Vitamin D (CITRACAL +D3 PO) Take 1 tablet by mouth every evening.   cetirizine (ZYRTEC) 10 MG tablet Take 10 mg by mouth daily.   Cholecalciferol (VITAMIN  D3) 125 MCG (5000 UT) CAPS Take 5,000 Units by mouth daily.    docusate sodium (COLACE) 100 MG capsule Take 300 mg by mouth at bedtime.   DULoxetine (CYMBALTA) 60 MG capsule Take 60 mg by mouth daily.   levalbuterol (XOPENEX HFA) 45 MCG/ACT inhaler Inhale 1-2 puffs into the lungs every 4 (four) hours as needed for wheezing.   losartan-hydrochlorothiazide (HYZAAR) 50-12.5 MG tablet Take 1 tablet by mouth daily.   meloxicam (MOBIC) 15 MG tablet Take 15 mg by mouth daily.   montelukast (SINGULAIR) 10 MG tablet Take 10 mg by mouth daily.    MUCINEX MAXIMUM STRENGTH 1200 MG TB12 Take 1,200 mg by mouth every evening. Takes every other day in the evening   Multiple Vitamin (MULTIVITAMIN WITH MINERALS) TABS tablet Take 1 tablet by mouth daily.  One-A-Day Women's   SYMBICORT 160-4.5 MCG/ACT inhaler Inhale 2 puffs into the lungs 2 (two) times daily.    Allergies:   Shellfish allergy, Gluten meal, and Pollen extract   Social History   Socioeconomic History   Marital status: Divorced    Spouse name: Not on file   Number of children: Not on file   Years of education: Not on file   Highest education level: Not on file  Occupational History   Not on file  Tobacco Use   Smoking status: Never   Smokeless tobacco: Never  Vaping Use   Vaping Use: Never used  Substance and Sexual Activity   Alcohol use: No   Drug use: No   Sexual activity: Not on file  Other Topics Concern   Not on file  Social History Narrative   Not on file   Social Determinants of Health   Financial Resource Strain: Not on file  Food Insecurity: Not on file  Transportation Needs: Not on file  Physical Activity: Not on file  Stress: Not on file  Social Connections: Not on file     Family History:  The patient's family history is not on file.   ROS:   Please see the history of present illness.    ROS All other systems reviewed and are negative.      No data to display             PHYSICAL EXAM:   VS:  BP 124/74   Pulse 73   Ht 5' 1"$  (1.549 m)   Wt 241 lb (109.3 kg)   SpO2 97%   BMI 45.54 kg/m    GEN: Well nourished, well developed in no acute distress HEENT: Normal NECK: No JVD; No carotid bruits LYMPHATICS: No lymphadenopathy CARDIAC:RRR, no murmurs, rubs, gallops RESPIRATORY:  Clear to auscultation without rales, wheezing or rhonchi  ABDOMEN: Soft, non-tender, non-distended MUSCULOSKELETAL:  No edema; No deformity  SKIN: Warm and dry NEUROLOGIC:  Alert and oriented x 3 PSYCHIATRIC:  Normal affect  Wt Readings from Last 3 Encounters:  10/16/22 241 lb (109.3 kg)  06/18/22 230 lb 3.2 oz (104.4 kg)  12/25/21 230 lb 3.2 oz (104.4 kg)      Studies/Labs Reviewed:   EKG:  EKG is ordered today.  The ekg ordered today  demonstrates NSR with low voltage QRS  Recent Labs: 12/19/2021: ALT 17; BUN 17; Creatinine, Ser 0.49; Hemoglobin 13.0; Platelets 266; Potassium 3.6; Sodium 139   Lipid Panel No results found for: "CHOL", "TRIG", "HDL", "CHOLHDL", "VLDL", "LDLCALC", "LDLDIRECT"  Additional studies/ records that were reviewed today include:  OV notes from Pulmonary    ASSESSMENT:  1. Coronary artery disease due to lipid rich plaque   2. Benign essential HTN   3. Hyperlipidemia LDL goal <70   4. 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 HTN -BP controlled on exam today -Continue prescription management with losartan HCT 50-12.5 mg daily with as needed refills -I have personally reviewed and interpreted outside labs performed by patient's PCP which showed serum creatinine 0.49 potassium 3.6 on 12/19/2021  3. HLD -LDL goal < 70  -statin therapy was stopped due to fibromyalgia -She was seen in lipid clinic back in December 22 and recommended trying Crestor but she got muscle aches on it -she had been on simvastatin in the past and had some left so she tried the simvastatin again which she has been tolerating but does not remember the dose and will call when she gets home -Repeat FLP and ALT in 6 weeks  Time Spent: 25 minutes total time of encounter, including 20 minutes spent in face-to-face patient care on the date of this encounter. This time includes coordination of care and counseling regarding above mentioned problem list. Remainder of  non-face-to-face time involved reviewing chart documents/testing relevant to the patient encounter and documentation in the medical record. I have independently reviewed documentation from referring provider  Medication Adjustments/Labs and Tests Ordered: Current medicines are reviewed at length with the patient today.  Concerns regarding medicines are outlined above.  Medication changes, Labs and Tests ordered today are listed in the Patient Instructions below.     Signed, Fransico Him, MD  10/16/2022 3:00 PM    [address] [place], [place], [place]  [address] Phone: [phone]; Fax: [phone]

## 2022-10-20 ENCOUNTER — Encounter: Payer: Self-pay | Admitting: Cardiology

## 2022-10-20 ENCOUNTER — Telehealth: Payer: Self-pay | Admitting: Cardiology

## 2022-10-20 NOTE — Telephone Encounter (Signed)
Pt wants to let Dr. Radford Pax know that she is taking the simvastatin 20 mg.

## 2022-10-23 NOTE — Telephone Encounter (Signed)
Patient reporting she takes simvastatin 20 mg daily, added to med list.

## 2022-12-01 ENCOUNTER — Ambulatory Visit: Payer: Medicare Other | Attending: Cardiology

## 2022-12-01 DIAGNOSIS — E785 Hyperlipidemia, unspecified: Secondary | ICD-10-CM

## 2022-12-02 LAB — LIPID PANEL
Chol/HDL Ratio: 1.7 ratio (ref 0.0–4.4)
Cholesterol, Total: 149 mg/dL (ref 100–199)
HDL: 89 mg/dL (ref >39)
LDL Chol Calc (NIH): 48 mg/dL (ref 0–99)
Triglycerides: 58 mg/dL (ref 0–149)
VLDL Cholesterol Cal: 12 mg/dL (ref 5–40)

## 2022-12-02 LAB — ALT: ALT: 18 IU/L (ref 0–32)

## 2022-12-10 ENCOUNTER — Telehealth: Payer: Self-pay

## 2022-12-10 NOTE — Telephone Encounter (Signed)
-----   Message from Quintella Reichert, MD sent at 12/02/2022  7:30 AM EDT ----- Lipids at goal continue current therapy and forward to PCP

## 2022-12-10 NOTE — Telephone Encounter (Signed)
Called patient using Interpre[employer] services, worked with [employer] [phone]. Advised patient that cholesterol labs were normal. She confirms she is taking her simvastatin every day and verbalizes understanding to continue taking this medication. Advised that labs would be forwarded to PCP.

## 2022-12-22 ENCOUNTER — Encounter: Payer: Self-pay | Admitting: Cardiology

## 2022-12-22 MED ORDER — SIMVASTATIN 20 MG PO TABS: 20.0000 mg | ORAL_TABLET | Freq: Every day | ORAL | 3 refills | Status: DC

## 2023-01-19 ENCOUNTER — Ambulatory Visit (INDEPENDENT_AMBULATORY_CARE_PROVIDER_SITE_OTHER): Payer: Medicare Other | Admitting: Internal Medicine

## 2023-01-19 ENCOUNTER — Encounter: Payer: Self-pay | Admitting: Internal Medicine

## 2023-01-19 VITALS — BP 120/80 | HR 80 | Ht 61.0 in | Wt 248.6 lb

## 2023-01-19 DIAGNOSIS — Z91013 Allergy to seafood: Secondary | ICD-10-CM | POA: Diagnosis not present

## 2023-01-19 DIAGNOSIS — J4540 Moderate persistent asthma, uncomplicated: Secondary | ICD-10-CM

## 2023-01-19 MED ORDER — LEVALBUTEROL TARTRATE 45 MCG/ACT IN AERO
1.0000 | INHALATION_SPRAY | RESPIRATORY_TRACT | 3 refills | Status: DC | PRN
Start: 2023-01-19 — End: 2023-07-19
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
## 2023-01-19 NOTE — Patient Instructions (Addendum)
ICD-10-CM   1. Asthma, well controlled, moderate persistent  J45.40     2. Shellfish allergy  Z91.013        Moderate persistent asthma, unspecified whether complicated  -Stable and well controlled. - Noted prednisone in dec 2023 while in [place].  Plan -Continue Symbicort scheduled, Singulair and Zyrtec - Take EpiPen prescription for emergency use in the case of shellfish allergy - FLu shot in fall    OSA on CPAP -per your sleep doctor  Followu0 - Dr Marchelle Gearing 6-9 months or sooner; can look at reducing singulair next visit

## 2023-07-19 ENCOUNTER — Encounter: Payer: Self-pay | Admitting: Internal Medicine

## 2023-07-19 ENCOUNTER — Ambulatory Visit (INDEPENDENT_AMBULATORY_CARE_PROVIDER_SITE_OTHER): Payer: Medicare Other | Admitting: Internal Medicine

## 2023-07-19 VITALS — BP 134/89 | HR 82 | Ht 61.0 in | Wt 244.2 lb

## 2023-07-19 DIAGNOSIS — J45901 Unspecified asthma with (acute) exacerbation: Secondary | ICD-10-CM

## 2023-07-19 DIAGNOSIS — T7849XA Other allergy, initial encounter: Secondary | ICD-10-CM

## 2023-07-19 LAB — CBC WITH DIFFERENTIAL/PLATELET
Basophils Absolute: 0.1 K/uL (ref 0.0–0.1)
Basophils Relative: 0.6 % (ref 0.0–3.0)
Eosinophils Absolute: 0.3 K/uL (ref 0.0–0.7)
Eosinophils Relative: 2.8 % (ref 0.0–5.0)
HCT: 39.7 % (ref 36.0–46.0)
Hemoglobin: 12.8 g/dL (ref 12.0–15.0)
Lymphocytes Relative: 21.2 % (ref 12.0–46.0)
Lymphs Abs: 2.1 K/uL (ref 0.7–4.0)
MCHC: 32.3 g/dL (ref 30.0–36.0)
MCV: 89.4 fl (ref 78.0–100.0)
Monocytes Absolute: 0.7 K/uL (ref 0.1–1.0)
Monocytes Relative: 7.6 % (ref 3.0–12.0)
Neutro Abs: 6.6 K/uL (ref 1.4–7.7)
Neutrophils Relative %: 67.8 % (ref 43.0–77.0)
Platelets: 335 K/uL (ref 150.0–400.0)
RBC: 4.44 Mil/uL (ref 3.87–5.11)
RDW: 15.7 % — ABNORMAL HIGH (ref 11.5–15.5)
WBC: 9.7 K/uL (ref 4.0–10.5)

## 2023-07-19 MED ORDER — ALBUTEROL SULFATE (2.5 MG/3ML) 0.083% IN NEBU: 2.5000 mg | INHALATION_SOLUTION | RESPIRATORY_TRACT | 5 refills | Status: DC | PRN

## 2023-07-19 MED ORDER — SYMBICORT 160-4.5 MCG/ACT IN AERO: 2.0000 | INHALATION_SPRAY | Freq: Two times a day (BID) | RESPIRATORY_TRACT | 5 refills | Status: DC

## 2023-07-19 MED ORDER — PREDNISONE 10 MG PO TABS: ORAL_TABLET | ORAL | 0 refills | Status: DC

## 2023-07-19 MED ORDER — LEVALBUTEROL TARTRATE 45 MCG/ACT IN AERO: 1.0000 | INHALATION_SPRAY | RESPIRATORY_TRACT | 3 refills | Status: DC | PRN

## 2023-07-19 NOTE — Patient Instructions (Addendum)
ICD-10-CM   1. Acute exacerbation of extrinsic asthma  J45.901     2. Allergic reaction to COVID-19 vaccine  T78.49XA        Asthma in slow to resolve flare up  - Noted prednisone in dec 2023 and Nov 2024 while in [place]. - current flare up follow covid mRNA Pfizer vaccine  Plan - do blood work cbc with dff, IgE -Continue Symbicort scheduled, Singulair and Zyrtec - use albuterol MDI and neb for rescue  - CMA to do refill 07/19/2023  -Lis covid mRNA vaccine as allergy - Take prednisone 40 mg daily x 2 days, then 20mg  daily x 2 days, then 10mg  daily x 2 days, then 5mg  daily x 2 days and stop - rpeat cxr in 3 months   OSA on CPAP -per your sleep doctor  Followup - APP in 3 months or sooner;   = then Evolett [place]ar[place] in 9 months from [place]
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
## 2023-07-21 LAB — IGE: IgE (Immunoglobulin E), Serum: 88 kU/L (ref <=114)

## 2023-08-16 ENCOUNTER — Other Ambulatory Visit: Payer: Self-pay | Admitting: Medical Genetics

## 2023-08-20 ENCOUNTER — Other Ambulatory Visit (HOSPITAL_COMMUNITY): Payer: Self-pay

## 2023-09-03 ENCOUNTER — Ambulatory Visit: Payer: Medicare Other | Attending: Cardiology | Admitting: Cardiology

## 2023-09-03 ENCOUNTER — Encounter: Payer: Self-pay | Admitting: Cardiology

## 2023-09-03 VITALS — BP 130/84 | HR 73 | Ht 61.0 in | Wt 248.8 lb

## 2023-09-03 DIAGNOSIS — G4733 Obstructive sleep apnea (adult) (pediatric): Secondary | ICD-10-CM | POA: Insufficient documentation

## 2023-09-03 DIAGNOSIS — I10 Essential (primary) hypertension: Secondary | ICD-10-CM | POA: Insufficient documentation

## 2023-09-03 DIAGNOSIS — I2510 Atherosclerotic heart disease of native coronary artery without angina pectoris: Secondary | ICD-10-CM | POA: Diagnosis present

## 2023-09-03 DIAGNOSIS — I2583 Coronary atherosclerosis due to lipid rich plaque: Secondary | ICD-10-CM | POA: Diagnosis not present

## 2023-09-03 DIAGNOSIS — E785 Hyperlipidemia, unspecified: Secondary | ICD-10-CM | POA: Diagnosis present

## 2023-09-03 LAB — COMPREHENSIVE METABOLIC PANEL
ALT: 15 [IU]/L (ref 0–32)
AST: 13 [IU]/L (ref 0–40)
Albumin: 4 g/dL (ref 3.8–4.8)
Alkaline Phosphatase: 86 [IU]/L (ref 44–121)
BUN/Creatinine Ratio: 46 — ABNORMAL HIGH (ref 12–28)
BUN: 22 mg/dL (ref 8–27)
Bilirubin Total: 0.2 mg/dL (ref 0.0–1.2)
CO2: 25 mmol/L (ref 20–29)
Calcium: 10.5 mg/dL — ABNORMAL HIGH (ref 8.7–10.3)
Chloride: 104 mmol/L (ref 96–106)
Creatinine, Ser: 0.48 mg/dL — ABNORMAL LOW (ref 0.57–1.00)
Globulin, Total: 2.4 g/dL (ref 1.5–4.5)
Glucose: 107 mg/dL — ABNORMAL HIGH (ref 70–99)
Potassium: 3.9 mmol/L (ref 3.5–5.2)
Sodium: 143 mmol/L (ref 134–144)
Total Protein: 6.4 g/dL (ref 6.0–8.5)
eGFR: 101 mL/min/{1.73_m2} (ref >59)

## 2023-09-03 LAB — LDL CHOLESTEROL, DIRECT: LDL Direct: 75 mg/dL (ref 0–99)

## 2023-09-03 NOTE — Progress Notes (Signed)
 Cardiology Office Note:   Date:  09/03/2023  ID:  Carol Gilbert, DOB 1951-04-12, MRN [id number] PCP: Gladystine Erminio CROME, MD  [facility] Providers Cardiologist:  Wilbert Bihari, MD    History of Present Illness:   Discussed the use of AI scribe software for clinical note transcription with the patient, who gave verbal consent to proceed.  History of Present Illness   The patient is a 73 year old individual with a complex medical history including asthma, diabetes, fibromyalgia, hypertension, obstructive sleep apnea, chronic rhinitis, pulmonary nodules, and morbid obesity status post bariatric surgery in 2019. She also has a history of aortic atherosclerosis and coronary artery calcifications of the LAD and RCA. 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 The patient was started on aspirin  81mg  and seen in the lipid clinic. She initially tried Crestor  but had to discontinue due to myalgias and is now back on simvastatin . Patient reports discontinuing ASA since her last visit with us , cites problems with bleeding with finger glucose checks.   Over the past year and a half, the patient reports no significant cardiac issues. She has been adherent to her blood pressure medication. Her primary complaints are related to orthopedic issues, including knee and shoulder pain, both of which have required replacement surgeries. Despite these issues, the patient maintains an active lifestyle, including water  exercises at the [facility] three times a week and walking when possible.  The patient reports chronic shortness of breath, which she attributes to her asthma. She experienced an isolated incident of palpitations at night about a month ago, but no further episodes since then. She also reports swelling in her legs, which has  been ongoing for about six months. The swelling is not associated with pain or other discomfort, but she has noticed that her socks often leave a ring around her legs.  The patient has been managing a significant amount of stress over the past year, including the illness and death of a brother and the placement of an elderly aunt into assisted living. Despite these challenges, she has remained adherent to her medication regimen and has continued to engage in regular physical activity.      Studies Reviewed:    EKG:   EKG Interpretation Date/Time:  Friday September 03 2023 08:00:36 EST Ventricular Rate:  73 PR Interval:  140 QRS Duration:  88 QT Interval:  410 QTC Calculation: 451 R Axis:   78  Text Interpretation: Normal sinus rhythm Normal ECG When compared with ECG of 13-Oct-2019 14:59, Questionable change in QRS axis Confirmed by Trudy Birmingham [phone]) on 09/03/2023 8:21:20 AM    Cardiac Studies & Procedures      ECHOCARDIOGRAM  ECHOCARDIOGRAM COMPLETE 08/15/2021  Narrative ECHOCARDIOGRAM REPORT    Patient Name:   Carol Gilbert Date of Exam: 08/15/2021 Medical Rec #:  [id number]              Height:       61.0 in Accession #:    [id number]             Weight:       221.6 lb Date of Birth:  07/05/1951             BSA:          1.973 m Patient Age:    26 years  BP:           148/84 mmHg Patient Gender: F                      HR:           61 bpm. Exam Location:  [address]  Procedure: 2D Echo, Cardiac Doppler and Color Doppler  Indications:    I25.10, I25.84 Coronary artery calcification  History:        Patient has no prior history of Echocardiogram examinations. Risk Factors:Hypertension and Diabetes. Asthma. Obstructive sleep apnea. Fibromyalgia.  Sonographer:    Jon Hacker RCS Referring Phys: [phone] TRACI R TURNER  IMPRESSIONS   1. Left ventricular ejection fraction, by estimation, is 65 to 70%. Left ventricular ejection fraction by  PLAX is 69 %. The left ventricle has normal function. The left ventricle has no regional wall motion abnormalities. There is mild left ventricular hypertrophy of the basal-septal segment. Left ventricular diastolic parameters were normal. 2. Right ventricular systolic function is normal. The right ventricular size is normal. There is normal pulmonary artery systolic pressure. 3. The mitral valve is normal in structure. Trivial mitral valve regurgitation. No evidence of mitral stenosis. 4. The aortic valve is tricuspid. Aortic valve regurgitation is not visualized. No aortic stenosis is present. 5. There is mild dilatation of the ascending aorta, measuring 39 mm. 6. The inferior vena cava is normal in size with greater than 50% respiratory variability, suggesting right atrial pressure of 3 mmHg.  FINDINGS Left Ventricle: Left ventricular ejection fraction, by estimation, is 65 to 70%. Left ventricular ejection fraction by PLAX is 69 %. The left ventricle has normal function. The left ventricle has no regional wall motion abnormalities. The left ventricular internal cavity size was normal in size. There is mild left ventricular hypertrophy of the basal-septal segment. Left ventricular diastolic parameters were normal.  Right Ventricle: The right ventricular size is normal. No increase in right ventricular wall thickness. Right ventricular systolic function is normal. There is normal pulmonary artery systolic pressure. The tricuspid regurgitant velocity is 2.40 m/s, and with an assumed right atrial pressure of 3 mmHg, the estimated right ventricular systolic pressure is 26.0 mmHg.  Left Atrium: Left atrial size was normal in size.  Right Atrium: Right atrial size was normal in size.  Pericardium: There is no evidence of pericardial effusion.  Mitral Valve: The mitral valve is normal in structure. Trivial mitral valve regurgitation. No evidence of mitral valve stenosis.  Tricuspid Valve: The  tricuspid valve is normal in structure. Tricuspid valve regurgitation is mild . No evidence of tricuspid stenosis.  Aortic Valve: The aortic valve is tricuspid. Aortic valve regurgitation is not visualized. No aortic stenosis is present.  Pulmonic Valve: The pulmonic valve was normal in structure. Pulmonic valve regurgitation is trivial. No evidence of pulmonic stenosis.  Aorta: The aortic root is normal in size and structure. There is mild dilatation of the ascending aorta, measuring 39 mm.  Venous: The inferior vena cava is normal in size with greater than 50% respiratory variability, suggesting right atrial pressure of 3 mmHg.  IAS/Shunts: No atrial level shunt detected by color flow Doppler.   LEFT VENTRICLE PLAX 2D LV EF:         Left            Diastology ventricular     LV e' medial:    9.08 cm/s ejection        LV E/e' medial:  11.0 fraction by  LV e' lateral:   13.50 cm/s PLAX is 69      LV E/e' lateral: 7.4 %. LVIDd:         4.20 cm LVIDs:         2.60 cm LV PW:         1.00 cm LV IVS:        1.10 cm LVOT diam:     1.85 cm LV SV:         75 LV SV Index:   38 LVOT Area:     2.69 cm   RIGHT VENTRICLE RV Basal diam:  2.30 cm RV S prime:     10.70 cm/s TAPSE (M-mode): 2.0 cm RVSP:           26.0 mmHg  LEFT ATRIUM             Index        RIGHT ATRIUM           Index LA diam:        3.30 cm 1.67 cm/m   RA Pressure: 3.00 mmHg LA Vol (A2C):   54.8 ml 27.78 ml/m  RA Area:     15.10 cm LA Vol (A4C):   29.5 ml 14.95 ml/m  RA Volume:   32.70 ml  16.57 ml/m LA Biplane Vol: 41.4 ml 20.98 ml/m AORTIC VALVE LVOT Vmax:   117.00 cm/s LVOT Vmean:  73.300 cm/s LVOT VTI:    0.278 m  AORTA Ao Root diam: 3.30 cm Ao Asc diam:  3.90 cm  MITRAL VALVE               TRICUSPID VALVE MV Area (PHT): 2.58 cm    TR Peak grad:   23.0 mmHg MV Decel Time: 294 msec    TR Vmax:        240.00 cm/s MV E velocity: 99.60 cm/s  Estimated RAP:  3.00 mmHg MV A velocity: 99.00 cm/s   RVSP:           26.0 mmHg MV E/A ratio:  1.01 SHUNTS Systemic VTI:  0.28 m Systemic Diam: 1.85 cm  Annabella Scarce MD Electronically signed by Annabella Scarce MD Signature Date/Time: 08/15/2021/6:17:24 PM    Final    CT SCANS  CT CORONARY MORPH W/CTA COR W/SCORE 08/14/2021  Addendum 08/14/2021 11:47 AM ADDENDUM REPORT: 08/14/2021 11:45  CLINICAL DATA:  73 Year-old White Female  EXAM: Cardiac/Coronary  CTA  TECHNIQUE: The patient was scanned on a [employer].  FINDINGS: Scan was triggered in the descending thoracic aorta. Axial non-contrast 3 mm slices were carried out through the heart. The data set was analyzed on a dedicated work station and scored using the Agatson method. Gantry rotation speed was 250 msecs and collimation was .6 mm. 0.8 mg of sl NTG was given. The 3D data set was reconstructed in 5% intervals of the 67-82 % of the R-R cycle. Diastolic phases were analyzed on a dedicated work station using MPR, MIP and VRT modes. The patient received 95 cc of contrast.  Aorta: 42 mm ascending aortic dilation. Aortic atherosclerosis noted. No dissection.  Main Pulmonary Artery: Normal size of the pulmonary artery.  Aortic Valve:  Tri-leaflet.  No calcifications.  Coronary Arteries:  Normal coronary origin.  Right dominance.  Coronary Calcium  Score:  Left main: 0  Left anterior descending artery: 3  Left circumflex artery: 0  Right coronary artery: 6  Total: 9  Percentile: 49th for age, sex, and race matched control.  RCA is a large dominant artery that gives rise to PDA and PLA. Minimal non-obstructive (1-24%) calcified plaque in the proximal vessel.  Left main is a large artery that gives rise to LAD and LCX arteries. There is no significant plaque.  LAD is a large vessel that gives rise to one large D2 Branch. Myocardial bridge mid vessel. Mild non obstructive (25-49%) calcified plaque at the take off of the D2 vessel.  LCX is  a non-dominant artery that gives rise to one large OM1 branch. There is no significant plaque.  Other findings:  Normal pulmonary vein drainage into the left atrium.  Normal left atrial appendage without a thrombus.  Small PFO.  Extra-cardiac findings: See attached radiology report for non-cardiac structures.  Cardiac motion artifact noted.  IMPRESSION: 1. Coronary calcium  score of 9. This was 49th percentile for age, sex, and race matched control.  2. Normal coronary origin with right dominance.  3. CAD-RADS 2. Mild non-obstructive CAD (25-49%). Consider non-atherosclerotic causes of chest pain. Consider preventive therapy and risk factor modification.  4.  42 mm ascending aortic dilation.  Aortic atherosclerosis noted.  RECOMMENDATIONS:  Coronary artery calcium  (CAC) score is a strong predictor of incident coronary heart disease (CHD) and provides predictive information beyond traditional risk factors. CAC scoring is reasonable to use in the decision to withhold, postpone, or initiate statin therapy in intermediate-risk or selected borderline-risk asymptomatic adults (age 65-75 years and LDL-C >=70 to <190 mg/dL) who do not have diabetes or established atherosclerotic cardiovascular disease (ASCVD).* In intermediate-risk (10-year ASCVD risk >=7.5% to <20%) adults or selected borderline-risk (10-year ASCVD risk >=5% to <7.5%) adults in whom a CAC score is measured for the purpose of making a treatment decision the following recommendations have been made:  If CAC = 0, it is reasonable to withhold statin therapy and reassess in 5 to 10 years, as long as higher risk conditions are absent (diabetes mellitus, family history of premature CHD in first degree relatives (males <55 years; females <65 years), cigarette smoking, LDL >=190 mg/dL or other independent risk factors).  If CAC is 1 to 99, it is reasonable to initiate statin therapy for patients >=55 years of  age.  If CAC is >=100 or >=75th percentile, it is reasonable to initiate statin therapy at any age.  Cardiology referral should be considered for patients with CAC scores =400 or >=75th percentile.  *2018 AHA/ACC/AACVPR/AAPA/ABC/ACPM/ADA/AGS/APhA/ASPC/NLA/PCNA Guideline on the Management of Blood Cholesterol: A Report of the American College of Cardiology/American Heart Association Task Force on Clinical Practice Guidelines. J Am Coll Cardiol. 2019;73(24):3168-3209.  Stanly Leavens, MD   Electronically Signed By: Stanly Leavens M.D. On: 08/14/2021 11:45  Narrative EXAM: OVER-READ INTERPRETATION  CT CHEST  The following report is an over-read performed by radiologist Dr. Toribio Aye of [facility] on 08/14/2021. This over-read does not include interpretation of cardiac or coronary anatomy or pathology. The coronary calcium  score/coronary CTA interpretation by the cardiologist is attached.  COMPARISON:  None.  FINDINGS: Aortic atherosclerosis with ectasia of ascending thoracic aorta which measures up to 4.2 cm in diameter. Within the visualized portions of the thorax there are no suspicious appearing pulmonary nodules or masses, there is no acute consolidative airspace disease, no pleural effusions, no pneumothorax and no lymphadenopathy. Visualized portions of the upper abdomen demonstrate a suture line associated with the stomach, which is incompletely imaged. There are no aggressive appearing lytic or blastic lesions noted in the visualized portions of the skeleton.  IMPRESSION: 1.  Aortic Atherosclerosis (ICD10-I70.0). 2. 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 Recommend annual imaging followup by CTA or MRA. This recommendation follows 2010 ACCF/AHA/AATS/ACR/ASA/SCA/SCAI/SIR/STS/SVM Guidelines for the Diagnosis and Management of Patients with Thoracic Aortic Disease. Circulation. 2010; 121: Z733-z630. Aortic  aneurysm NOS (ICD10-I71.9).  Electronically Signed: By: Toribio Aye M.D. On: 08/14/2021 09:09           Risk Assessment/Calculations:              Physical Exam:   VS:  BP 130/84   Pulse 73   Ht 5' 1 (1.549 m)   Wt 248 lb 12.8 oz (112.9 kg)   SpO2 98%   BMI 47.01 kg/m    Wt Readings from Last 3 Encounters:  09/03/23 248 lb 12.8 oz (112.9 kg)  07/19/23 244 lb 3.2 oz (110.8 kg)  01/19/23 248 lb 9.6 oz (112.8 kg)     Physical Exam Vitals reviewed.  Constitutional:      Appearance: Normal appearance. She is obese.  HENT:     Head: Normocephalic.     Nose: Nose normal.  Eyes:     Pupils: Pupils are equal, round, and reactive to light.  Cardiovascular:     Rate and Rhythm: Normal rate and regular rhythm.     Pulses: Normal pulses.     Heart sounds: Normal heart sounds. No murmur heard.    No friction rub. No gallop.  Pulmonary:     Effort: Pulmonary effort is normal.     Breath sounds: Normal breath sounds.  Abdominal:     General: Abdomen is flat.  Musculoskeletal:     Right lower leg: Edema (trace non-pitting) present.     Left lower leg: Edema (trace non-pitting) present.  Skin:    General: Skin is warm and dry.     Capillary Refill: Capillary refill takes less than 2 seconds.  Neurological:     General: No focal deficit present.     Mental Status: She is alert and oriented to person, place, and time.  Psychiatric:        Mood and Affect: Mood normal.        Behavior: Behavior normal.        Thought Content: Thought content normal.        Judgment: Judgment normal.    ASSESSMENT AND PLAN:     Assessment and Plan    Coronary Artery Disease Aortic atherosclerosis and coronary artery calcifications with a calcium  score of 9. <25% nonobstructive plaque in proximal RCA, myocardial bridging in mid LAD, and 25-49% mid LAD stenosis. Currently on simvastatin ; aspirin  discontinued due to bleeding during glucose checks. Discussed risk factor management to  prevent disease progression. Patient prefers to avoid aspirin  due to bleeding issues. - Continue simvastatin  - Recheck cholesterol and kidney function with complete metabolic panel and LDL direct  Aortic Aneurysm Mild aneurysmal change in ascending aorta, seen in 2022 echo. Has been followed for serial imaging by a [facility] provider, November 2024 CTA chest with  4.2 x 4 cm, stable aorta size. No immediate intervention required, under specialist surveillance. - Continue monitoring with periodic imaging  Hypertension Well-controlled on losartan/hydrochlorothiazide (Hyzaar) 50/12.5 mg. - Continue losartan/hydrochlorothiazide  Peripheral Edema New onset leg swelling over past 6 months, likely vascular in origin. Recommended compression stockings to manage swelling. Discussed benefits of compression stockings in reducing fluid accumulation and importance of using highest comfortable pressure. - Use compression stockings with the highest pressure that can be comfortably worn  Obstructive  Sleep Apnea Not using CPAP due to low pressure settings. Awaiting pulmonologist appointment in February to adjust settings. Discussed risk of untreated sleep apnea leading to arrhythmias such as atrial fibrillation. Emphasized importance of CPAP adherence. - Follow up with pulmonologist in February to adjust CPAP settings  General Health Maintenance Managing multiple chronic conditions with significant stressors. Engages in regular exercise including water  exercises and walking. Discussed importance of maintaining exercise routine for cardiovascular health. - Encourage continuation of regular exercise - Find a new personal trainer for structured exercise sessions  Follow-up - Schedule follow-up appointment in one year.                 Signed, Artist Pouch, PA-C

## 2023-09-03 NOTE — Patient Instructions (Signed)
 Medication Instructions:  The current medical regimen is effective. Continue present plan and medications as directed. Please refer to the Current Medication list given to you today.    *If you need a refill on your cardiac medications before your next appointment, please call your pharmacy*   Lab Work: CMET, LDL DIRECT If you have labs (blood work) drawn today and your tests are completely normal, you will receive your results only by: MyChart Message (if you have MyChart) OR A paper copy in the mail If you have any lab test that is abnormal or we need to change your treatment, we will call you to review the results.   Testing/Procedures: NONE   Follow-Up: At [facility], you and your health needs are our priority.  As part of our continuing mission to provide you with exceptional heart care, we have created designated Provider Care Teams.  These Care Teams include your primary Cardiologist (physician) and Advanced Practice Providers (APPs -  Physician Assistants and Nurse Practitioners) who all work together to provide you with the care you need, when you need it.  We recommend signing up for the patient portal called MyChart.  Sign up information is provided on this After Visit Summary.  MyChart is used to connect with patients for Virtual Visits (Telemedicine).  Patients are able to view lab/test results, encounter notes, upcoming appointments, etc.  Non-urgent messages can be sent to your provider as well.   To learn more about what you can do with MyChart, go to [email or web address].    Your next appointment:   1 year(s)  Provider:   Wilbert Bihari, MD     Other Instructions Thank you for choosing [facility]!

## 2023-09-07 ENCOUNTER — Other Ambulatory Visit: Payer: Self-pay

## 2023-09-07 DIAGNOSIS — E785 Hyperlipidemia, unspecified: Secondary | ICD-10-CM

## 2023-09-07 DIAGNOSIS — Z79899 Other long term (current) drug therapy: Secondary | ICD-10-CM

## 2023-09-22 ENCOUNTER — Other Ambulatory Visit (HOSPITAL_COMMUNITY): Payer: Self-pay | Attending: Medical Genetics

## 2023-09-27 ENCOUNTER — Ambulatory Visit (INDEPENDENT_AMBULATORY_CARE_PROVIDER_SITE_OTHER): Payer: Medicare Other | Admitting: Adult Health

## 2023-09-27 ENCOUNTER — Encounter: Payer: Self-pay | Admitting: Adult Health

## 2023-09-27 VITALS — BP 126/60 | HR 73 | Ht 67.0 in | Wt 250.6 lb

## 2023-09-27 DIAGNOSIS — G4733 Obstructive sleep apnea (adult) (pediatric): Secondary | ICD-10-CM | POA: Diagnosis not present

## 2023-09-27 DIAGNOSIS — J310 Chronic rhinitis: Secondary | ICD-10-CM | POA: Diagnosis not present

## 2023-09-27 DIAGNOSIS — R918 Other nonspecific abnormal finding of lung field: Secondary | ICD-10-CM

## 2023-09-27 DIAGNOSIS — J45909 Unspecified asthma, uncomplicated: Secondary | ICD-10-CM | POA: Diagnosis not present

## 2023-09-27 DIAGNOSIS — J4540 Moderate persistent asthma, uncomplicated: Secondary | ICD-10-CM

## 2023-09-27 MED ORDER — ALBUTEROL SULFATE (2.5 MG/3ML) 0.083% IN NEBU: 2.5000 mg | INHALATION_SOLUTION | RESPIRATORY_TRACT | 5 refills | Status: AC | PRN

## 2023-09-27 MED ORDER — AZELASTINE HCL 0.1 % NA SOLN: 2.0000 | Freq: Two times a day (BID) | NASAL | 5 refills | Status: AC | PRN

## 2023-09-27 NOTE — Assessment & Plan Note (Signed)
Flare of symptoms.  Will change Zyrtec to Allegra.  Continue on Flonase and Singulair.  Add in Astelin nasal spray at bedtime.

## 2023-09-27 NOTE — Assessment & Plan Note (Addendum)
Recent asthmatic bronchitic exacerbation now improving.-Patient is to continue on current maintenance regimen.  If continues to have ongoing flare could consider escalating therapy with triple therapy plus or minus biologic therapy such as Dupixent.  For now we will continue on current maintenance regimen and trigger prevention.  Plan  Patient Instructions  CT chest in 1 month  Continue on Symbicort 2 puffs Twice daily , rinse after use.  Continue on Singulair 10mg  At bedtime  .  Albuterol inhaler As needed  Wheezing .  Change Zyrtec to Allegra 180mg  daily .  Flonase nasal daily  Add Astelin nasal 2 puffs At bedtime   Saline nasal rinses Twice daily   Saline nasal gel At bedtime   Activity as tolerated.  Restart CPAP At bedtime  all night long  Change CPAP pressure to 9 cmH2O  CPAP download in 1 month  Work on healthy weight.  Follow up in 6 months with Dr. Marchelle Gearing or Senan Urey NP and As needed .  Please contact office for sooner follow up if symptoms do not improve or worsen or seek emergency care

## 2023-09-27 NOTE — Progress Notes (Signed)
@Patient  ID: Carol Gilbert, female    DOB: August 28, 1951, 73 y.o.   MRN: [id number]  Chief Complaint  Patient presents with   Follow-up    Referring provider: Stevphen Rochester, MD  HPI:  73 year old female never smoker followed for moderate persistent asthma, chronic rhinitis, obstructive sleep apnea and pulmonary nodules Medical history significant for morbid obesity status post bariatric surgery in 2019 Patient is from [place]  TEST/EVENTS :  FENO 08/2018 -10 ppb  IgE 09/19/18  -76 Eosinophils 100   HST 10/25/18 - AHI 6.8, SpO2 low 87%    serial CT chest.  CT chest May 20, 2020 showed pulmonary nodules that were stable to decreased in size consistent with a benign etiology  09/27/2023  Follow up: Asthma , Chronic Rhinitis , OSA  Patient presents for a 47-month follow-up. Patient was seen in November for an acute asthmatic bronchitic flare after traveling to [place]  She was treated with dual antibiotics cefdinir and azithromycin along with a 5-day prednisone taper.  Chest x-ray report November 2024 showed no acute process.  Right lower lobe platelike atelectasis.  She was recommended on a prednisone taper. Since last visit patient says she is feeling much better.  Cough and congestion have decreased.  She did have a CT chest July 27, 2023 that showed a new focal nodular opacity in the right lower lobe.  Patient says she was recommended for a follow-up CT.  We set this up for next month.  Patient has no hemoptysis or unintentional weight loss.   She remains on Symbicort twice daily.  Singulair daily. Denies any increased albuterol use.  Patient has sleep apnea.  Says that she has been unable to wear her CPAP recently due to upper respiratory infection.  Also feels that her CPAP pressure is too low.  Send been under a lot of stress with her family and has not been sleeping as well.  Patient education given on sleep apnea and CPAP care.         Allergies   Allergen Reactions   Shellfish Allergy Anaphylaxis   Gluten Meal Other (See Comments)   Pollen Extract Other (See Comments)    Dust and smoke/ causes watery eyes; sneezing    Immunization History  Administered Date(s) Administered   Fluad Quad(high Dose 65+) 05/22/2019, 05/27/2021   Influenza, High Dose Seasonal PF 05/06/2018, 05/22/2019, 05/22/2020   Influenza, Seasonal, Injecte, Preservative Fre 05/18/2017   Influenza-Unspecified 05/01/2022   PFIZER(Purple Top)SARS-COV-2 Vaccination 11/05/2019, 12/05/2019, 06/07/2020, 12/21/2020   Pfizer Covid-19 Vaccine Bivalent Booster 75yrs & up 07/01/2021, 01/03/2022   Pfizer(Comirnaty)Fall Seasonal Vaccine 12 years and older 05/24/2022, 06/08/2023   Pneumococcal Conjugate-13 08/25/2018   Pneumococcal Polysaccharide-23 08/02/2017   Td 03/07/2019, 04/01/2019   Tdap 05/28/2020   Zoster Recombinant(Shingrix) 03/05/2019, 05/06/2019    Past Medical History:  Diagnosis Date   Apnea    Arthritis    Asthma    CAD (coronary artery disease), native coronary artery    Coronary CTA showed a coronary calcium score of 9 with minimal less than 25% nonobstructive plaque in the proximal RCA, 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   Diabetes mellitus without complication (HCC)    no medications   Diverticulitis    Fibromyalgia    Hyperlipidemia with target LDL less than 70    Hypertension     Tobacco History: Social History   Tobacco Use  Smoking Status Never  Smokeless Tobacco  Never   Counseling given: Not Answered   Outpatient Medications Prior to Visit  Medication Sig Dispense Refill   amLODipine (NORVASC) 5 MG tablet Take 5 mg by mouth daily.     Ascorbic Acid (VITAMIN C) 1000 MG tablet Take 1,000 mg by mouth daily.     aspirin EC 81 MG tablet Take 1 tablet (81 mg total) by mouth daily. Swallow whole. 90 tablet 3   b complex vitamins tablet Take 1 tablet by mouth every evening.       Calcium-Phosphorus-Vitamin D (CITRACAL +D3 PO) Take 1 tablet by mouth every evening.     cetirizine (ZYRTEC) 10 MG tablet Take 10 mg by mouth daily.     Cholecalciferol (VITAMIN D3) 125 MCG (5000 UT) CAPS Take 5,000 Units by mouth daily.      docusate sodium (COLACE) 100 MG capsule Take 300 mg by mouth at bedtime.     DULoxetine (CYMBALTA) 60 MG capsule Take 60 mg by mouth daily.     levalbuterol (XOPENEX HFA) 45 MCG/ACT inhaler Inhale 1-2 puffs into the lungs every 4 (four) hours as needed for wheezing. 45 each 3   losartan-hydrochlorothiazide (HYZAAR) 50-12.5 MG tablet Take 1 tablet by mouth daily.     meloxicam (MOBIC) 15 MG tablet Take 15 mg by mouth as needed for pain.     montelukast (SINGULAIR) 10 MG tablet Take 10 mg by mouth daily.      MUCINEX MAXIMUM STRENGTH 1200 MG TB12 Take 1,200 mg by mouth every evening. Takes every other day in the evening     Multiple Vitamin (MULTIVITAMIN WITH MINERALS) TABS tablet Take 1 tablet by mouth daily. One-A-Day Women's     predniSONE (DELTASONE) 10 MG tablet Take 4 tabs for 2 days, then 3 tabs for 2 days, 2 tabs for 2 days, then 1 tab for 2 days, then stop. (Patient not taking: Reported on 09/03/2023) 20 tablet 0   simvastatin (ZOCOR) 20 MG tablet Take 1 tablet (20 mg total) by mouth daily. (Patient taking differently: Take 40 mg by mouth daily.) 90 tablet 3   SYMBICORT 160-4.5 MCG/ACT inhaler Inhale 2 puffs into the lungs 2 (two) times daily. 1 each 5   albuterol (PROVENTIL) (2.5 MG/3ML) 0.083% nebulizer solution Take 3 mLs (2.5 mg total) by nebulization every 4 (four) hours as needed. 180 mL 5   No facility-administered medications prior to visit.     Review of Systems:   Constitutional:   No  weight loss, night sweats,  Fevers, chills, +fatigue, or  lassitude.  HEENT:   No headaches,  Difficulty swallowing,  Tooth/dental problems, or  Sore throat,                No sneezing, itching, ear ache, nasal congestion, post nasal drip,   CV:  No chest  pain,  Orthopnea, PND, swelling in lower extremities, anasarca, dizziness, palpitations, syncope.   GI  No heartburn, indigestion, abdominal pain, nausea, vomiting, diarrhea, change in bowel habits, loss of appetite, bloody stools.   Resp:  No chest wall deformity  Skin: no rash or lesions.  GU: no dysuria, change in color of urine, no urgency or frequency.  No flank pain, no hematuria   MS:  No joint pain or swelling.  No decreased range of motion.  No back pain.    Physical Exam  BP 126/60   Pulse 73   Ht 5\' 7"  (1.702 m)   Wt 250 lb 9.6 oz (113.7 kg)   SpO2 95%  BMI 39.25 kg/m   GEN: A/Ox3; pleasant , NAD, well nourished    HEENT:  [place]/AT,  NOSE-clear, THROAT-clear, no lesions, no postnasal drip or exudate noted.   NECK:  Supple w/ fair ROM; no JVD; normal carotid impulses w/o bruits; no thyromegaly or nodules palpated; no lymphadenopathy.    RESP  Clear  P & A; w/o, wheezes/ rales/ or rhonchi. no accessory muscle use, no dullness to percussion  CARD:  RRR, no m/r/g, no peripheral edema, pulses intact, no cyanosis or clubbing.  GI:   Soft & nt; nml bowel sounds; no organomegaly or masses detected.   Musco: Warm bil, no deformities or joint swelling noted.   Neuro: alert, no focal deficits noted.    Skin: Warm, no lesions or rashes    Lab Results:  CBC   BNP No results found for: "BNP"  ProBNP No results found for: "PROBNP"  Imaging: No results found.  Administration History     None          Latest Ref Rng & Units 11/01/2018    8:45 AM  PFT Results  FVC-Pre L 2.36   FVC-Predicted Pre % 85   FVC-Post L 2.38   FVC-Predicted Post % 85   Pre FEV1/FVC % % 73   Post FEV1/FCV % % 70   FEV1-Pre L 1.72   FEV1-Predicted Pre % 81   FEV1-Post L 1.67   DLCO uncorrected ml/min/mmHg 24.98   DLCO UNC% % 140   DLCO corrected ml/min/mmHg 24.83   DLCO COR %Predicted % 139   DLVA Predicted % 135     Lab Results  Component Value Date   NITRICOXIDE 10  09/26/2018        Assessment & Plan:   Asthma Recent asthmatic bronchitic exacerbation now improving.-Patient is to continue on current maintenance regimen.  If continues to have ongoing flare could consider escalating therapy with triple therapy plus or minus biologic therapy such as Dupixent.  For now we will continue on current maintenance regimen and trigger prevention.  Plan  Patient Instructions  CT chest in 1 month  Continue on Symbicort 2 puffs Twice daily , rinse after use.  Continue on Singulair 10mg  At bedtime  .  Albuterol inhaler As needed  Wheezing .  Change Zyrtec to Allegra 180mg  daily .  Flonase nasal daily  Add Astelin nasal 2 puffs At bedtime   Saline nasal rinses Twice daily   Saline nasal gel At bedtime   Activity as tolerated.  Restart CPAP At bedtime  all night long  Change CPAP pressure to 9 cmH2O  CPAP download in 1 month  Work on healthy weight.  Follow up in 6 months with Dr. Marchelle Gearing or Janyth Riera NP and As needed .  Please contact office for sooner follow up if symptoms do not improve or worsen or seek emergency care       OSA (obstructive sleep apnea) Obstructive sleep apnea.  Patient has been unable to tolerate CPAP lately recently with URI symptoms.  I will also adjust CPAP pressure for comfort.  Plan  Patient Instructions  CT chest in 1 month  Continue on Symbicort 2 puffs Twice daily , rinse after use.  Continue on Singulair 10mg  At bedtime  .  Albuterol inhaler As needed  Wheezing .  Change Zyrtec to Allegra 180mg  daily .  Flonase nasal daily  Add Astelin nasal 2 puffs At bedtime   Saline nasal rinses Twice daily   Saline nasal gel At bedtime  Activity as tolerated.  Restart CPAP At bedtime  all night long  Change CPAP pressure to 9 cmH2O  CPAP download in 1 month  Work on healthy weight.  Follow up in 6 months with Dr. Marchelle Gearing or Eldine Rencher NP and As needed .  Please contact office for sooner follow up if symptoms do not improve  or worsen or seek emergency care       Lung nodules Recent CT chest in November 2024 in the setting of acute illness shows some right lower lobe nodularity.  Will repeat CT chest in 1 month for serial follow-up.  Clinically patient is improved  Chronic rhinitis Flare of symptoms.  Will change Zyrtec to Allegra.  Continue on Flonase and Singulair.  Add in Astelin nasal spray at bedtime.     Rubye Oaks, NP 09/27/2023

## 2023-09-27 NOTE — Patient Instructions (Addendum)
CT chest in 1 month  Continue on Symbicort 2 puffs Twice daily , rinse after use.  Continue on Singulair 10mg  At bedtime  .  Albuterol inhaler As needed  Wheezing .  Change Zyrtec to Allegra 180mg  daily .  Flonase nasal daily  Add Astelin nasal 2 puffs At bedtime   Saline nasal rinses Twice daily   Saline nasal gel At bedtime   Activity as tolerated.  Restart CPAP At bedtime  all night long  Change CPAP pressure to 9 cmH2O  CPAP download in 1 month  Work on healthy weight.  Follow up in 6 months with Dr. Marchelle Gearing or Bronsyn Shappell NP and As needed .  Please contact office for sooner follow up if symptoms do not improve or worsen or seek emergency care

## 2023-09-27 NOTE — Assessment & Plan Note (Signed)
Obstructive sleep apnea.  Patient has been unable to tolerate CPAP lately recently with URI symptoms.  I will also adjust CPAP pressure for comfort.  Plan  Patient Instructions  CT chest in 1 month  Continue on Symbicort 2 puffs Twice daily , rinse after use.  Continue on Singulair 10mg  At bedtime  .  Albuterol inhaler As needed  Wheezing .  Change Zyrtec to Allegra 180mg  daily .  Flonase nasal daily  Add Astelin nasal 2 puffs At bedtime   Saline nasal rinses Twice daily   Saline nasal gel At bedtime   Activity as tolerated.  Restart CPAP At bedtime  all night long  Change CPAP pressure to 9 cmH2O  CPAP download in 1 month  Work on healthy weight.  Follow up in 6 months with Dr. Marchelle Gearing or Chastidy Ranker NP and As needed .  Please contact office for sooner follow up if symptoms do not improve or worsen or seek emergency care

## 2023-09-27 NOTE — Assessment & Plan Note (Signed)
Recent CT chest in November 2024 in the setting of acute illness shows some right lower lobe nodularity.  Will repeat CT chest in 1 month for serial follow-up.  Clinically patient is improved

## 2023-10-18 ENCOUNTER — Other Ambulatory Visit: Payer: Self-pay

## 2023-10-18 MED ORDER — SIMVASTATIN 20 MG PO TABS: 20.0000 mg | ORAL_TABLET | Freq: Every day | ORAL | 3 refills | Status: DC

## 2023-10-29 ENCOUNTER — Ambulatory Visit (HOSPITAL_BASED_OUTPATIENT_CLINIC_OR_DEPARTMENT_OTHER)
Admission: RE | Admit: 2023-10-29 | Discharge: 2023-10-29 | Disposition: A | Discharge status: Home / Self Care | Payer: Medicare Other | Source: Ambulatory Visit | Attending: Adult Health | Admitting: Adult Health

## 2023-10-29 DIAGNOSIS — R918 Other nonspecific abnormal finding of lung field: Secondary | ICD-10-CM | POA: Insufficient documentation

## 2024-02-29 ENCOUNTER — Ambulatory Visit (INDEPENDENT_AMBULATORY_CARE_PROVIDER_SITE_OTHER): Admitting: Internal Medicine

## 2024-02-29 ENCOUNTER — Encounter: Payer: Self-pay | Admitting: Internal Medicine

## 2024-02-29 VITALS — BP 128/82 | HR 82 | Ht 61.0 in | Wt 250.0 lb

## 2024-02-29 DIAGNOSIS — J4540 Moderate persistent asthma, uncomplicated: Secondary | ICD-10-CM | POA: Diagnosis not present

## 2024-02-29 DIAGNOSIS — R0609 Other forms of dyspnea: Secondary | ICD-10-CM

## 2024-02-29 DIAGNOSIS — R062 Wheezing: Secondary | ICD-10-CM | POA: Diagnosis not present
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
## 2024-02-29 NOTE — Patient Instructions (Addendum)
 ICD-10-CM   1. Moderate persistent asthma without complication  J45.40     2. Wheeze  R06.2      Significant chronic symptom burden despite being on Singulair , Symbicort  and Spiriva Asthma versus diastolic dysfunction versus vocal cord dysfunction on top differential diagnoses.  Plan - Recheck CBC differential - Check RAST allergy panel - Check echocardiogram and blood BNP - Check full pulmonary function test - Continue regular medications  Follow-up - Return to see Madelin Calin one of the nurse practitioners  - If symptoms are deemed due to asthma then we will just consider treating with a biologic and seeing what happens to symptom burden    OSA on CPAP -per your sleep doctor  Followup Nurse practitioner in 2 months but after completing all the test

## 2024-03-01 ENCOUNTER — Other Ambulatory Visit

## 2024-03-01 LAB — CBC WITH DIFFERENTIAL/PLATELET
Basophils Absolute: 0.1 10*3/uL (ref 0.0–0.1)
Basophils Relative: 0.8 % (ref 0.0–3.0)
Eosinophils Absolute: 0.1 10*3/uL (ref 0.0–0.7)
Eosinophils Relative: 1.6 % (ref 0.0–5.0)
HCT: 39.4 % (ref 36.0–46.0)
Hemoglobin: 13.2 g/dL (ref 12.0–15.0)
Lymphocytes Relative: 25.4 % (ref 12.0–46.0)
Lymphs Abs: 1.9 10*3/uL (ref 0.7–4.0)
MCHC: 33.5 g/dL (ref 30.0–36.0)
MCV: 87.4 fl (ref 78.0–100.0)
Monocytes Absolute: 0.4 10*3/uL (ref 0.1–1.0)
Monocytes Relative: 5.8 % (ref 3.0–12.0)
Neutro Abs: 5 10*3/uL (ref 1.4–7.7)
Neutrophils Relative %: 66.4 % (ref 43.0–77.0)
Platelets: 304 10*3/uL (ref 150.0–400.0)
RBC: 4.51 Mil/uL (ref 3.87–5.11)
RDW: 14.3 % (ref 11.5–15.5)
WBC: 7.6 10*3/uL (ref 4.0–10.5)

## 2024-03-01 LAB — BRAIN NATRIURETIC PEPTIDE: Pro B Natriuretic peptide (BNP): 48 pg/mL (ref 0.0–100.0)

## 2024-03-03 LAB — ALLERGEN PROFILE, PERENNIAL ALLERGEN IGE
Alternaria Alternata IgE: <0.1 kU/L
Aspergillus Fumigatus IgE: <0.1 kU/L
Aureobasidi Pullulans IgE: <0.1 kU/L
Candida Albicans IgE: 1.05 kU/L — AB
Cat Dander IgE: <0.1 kU/L
Chicken Feathers IgE: <0.1 kU/L
Cladosporium Herbarum IgE: <0.1 kU/L
Cow Dander IgE: <0.1 kU/L
D Farinae IgE: 1.06 kU/L — AB
D Pteronyssinus IgE: 1.01 kU/L — AB
Dog Dander IgE: <0.1 kU/L
Duck Feathers IgE: <0.1 kU/L
Goose Feathers IgE: <0.1 kU/L
Mouse Urine IgE: <0.1 kU/L
Mucor Racemosus IgE: <0.1 kU/L
Penicillium Chrysogen IgE: <0.1 kU/L
Phoma Betae IgE: <0.1 kU/L
Setomelanomma Rostrat: 0.23 kU/L — AB
Stemphylium Herbarum IgE: <0.1 kU/L

## 2024-04-10 ENCOUNTER — Encounter (HOSPITAL_BASED_OUTPATIENT_CLINIC_OR_DEPARTMENT_OTHER): Payer: Self-pay

## 2024-04-11 ENCOUNTER — Ambulatory Visit (HOSPITAL_BASED_OUTPATIENT_CLINIC_OR_DEPARTMENT_OTHER)

## 2024-04-11 DIAGNOSIS — R0609 Other forms of dyspnea: Secondary | ICD-10-CM | POA: Diagnosis not present

## 2024-04-11 DIAGNOSIS — R062 Wheezing: Secondary | ICD-10-CM | POA: Diagnosis not present

## 2024-04-11 DIAGNOSIS — J4540 Moderate persistent asthma, uncomplicated: Secondary | ICD-10-CM

## 2024-04-11 LAB — ECHOCARDIOGRAM COMPLETE
Area-P 1/2: 3.42 cm2
S' Lateral: 2.24 cm

## 2024-05-02 ENCOUNTER — Telehealth (HOSPITAL_BASED_OUTPATIENT_CLINIC_OR_DEPARTMENT_OTHER): Payer: Self-pay

## 2024-05-04 ENCOUNTER — Encounter: Payer: Self-pay | Admitting: Family Medicine

## 2024-05-04 ENCOUNTER — Other Ambulatory Visit: Payer: Self-pay

## 2024-05-04 DIAGNOSIS — Z1231 Encounter for screening mammogram for malignant neoplasm of breast: Secondary | ICD-10-CM

## 2024-05-07 ENCOUNTER — Ambulatory Visit: Payer: Self-pay | Admitting: Internal Medicine

## 2024-05-07 DIAGNOSIS — Q2112 Patent foramen ovale: Secondary | ICD-10-CM

## 2024-05-07 NOTE — Progress Notes (Signed)
 Blood allergy test with dust mite allergy and some mold ? [place] or exposure inside house. Can discus at followup but you can look up internet on how to controil dust mite

## 2024-05-11 ENCOUNTER — Encounter: Payer: Self-pay | Admitting: Adult Health

## 2024-05-11 ENCOUNTER — Ambulatory Visit: Admitting: Adult Health

## 2024-05-11 ENCOUNTER — Ambulatory Visit: Admitting: Pulmonary Disease

## 2024-05-11 VITALS — BP 108/72 | HR 87 | Temp 98.1°F | Ht 61.0 in | Wt 240.6 lb

## 2024-05-11 DIAGNOSIS — G4733 Obstructive sleep apnea (adult) (pediatric): Secondary | ICD-10-CM

## 2024-05-11 DIAGNOSIS — J4540 Moderate persistent asthma, uncomplicated: Secondary | ICD-10-CM

## 2024-05-11 DIAGNOSIS — J310 Chronic rhinitis: Secondary | ICD-10-CM

## 2024-05-11 DIAGNOSIS — Z23 Encounter for immunization: Secondary | ICD-10-CM

## 2024-05-11 DIAGNOSIS — R0609 Other forms of dyspnea: Secondary | ICD-10-CM

## 2024-05-11 DIAGNOSIS — R918 Other nonspecific abnormal finding of lung field: Secondary | ICD-10-CM | POA: Diagnosis not present

## 2024-05-11 DIAGNOSIS — R062 Wheezing: Secondary | ICD-10-CM

## 2024-05-11 LAB — PULMONARY FUNCTION TEST
DL/VA % pred: 129 %
DL/VA: 5.48 ml/min/mmHg/L
DLCO unc % pred: 110 %
DLCO unc: 19.25 ml/min/mmHg
FEF 25-75 Post: 1.31 L/s
FEF 25-75 Pre: 0.7 L/s
FEF2575-%Change-Post: 86 %
FEF2575-%Pred-Post: 77 %
FEF2575-%Pred-Pre: 41 %
FEV1-%Change-Post: 19 %
FEV1-%Pred-Post: 75 %
FEV1-%Pred-Pre: 63 %
FEV1-Post: 1.48 L
FEV1-Pre: 1.24 L
FEV1FVC-%Change-Post: 5 %
FEV1FVC-%Pred-Pre: 87 %
FEV6-%Change-Post: 14 %
FEV6-%Pred-Post: 84 %
FEV6-%Pred-Pre: 73 %
FEV6-Post: 2.09 L
FEV6-Pre: 1.83 L
FEV6FVC-%Change-Post: 1 %
FEV6FVC-%Pred-Post: 104 %
FEV6FVC-%Pred-Pre: 103 %
FVC-%Change-Post: 13 %
FVC-%Pred-Post: 80 %
FVC-%Pred-Pre: 71 %
FVC-Post: 2.11 L
FVC-Pre: 1.86 L
Post FEV1/FVC ratio: 70 %
Post FEV6/FVC ratio: 99 %
Pre FEV1/FVC ratio: 67 %
Pre FEV6/FVC Ratio: 98 %
RV % pred: 146 %
RV: 3.02 L
TLC % pred: 108 %
TLC: 4.97 L

## 2024-05-11 MED ORDER — TRELEGY ELLIPTA 200-62.5-25 MCG/ACT IN AEPB: 1.0000 | INHALATION_SPRAY | Freq: Every day | RESPIRATORY_TRACT | Status: DC

## 2024-05-11 MED ORDER — TRELEGY ELLIPTA 200-62.5-25 MCG/ACT IN AEPB: 1.0000 | INHALATION_SPRAY | Freq: Every day | RESPIRATORY_TRACT | 3 refills | Status: DC

## 2024-05-11 NOTE — Progress Notes (Signed)
 Full pft performed today

## 2024-05-11 NOTE — Patient Instructions (Addendum)
 Flu shot today.  Stop Symbicort  .  Begin Trelegy 1 puff daily, rinse after use.   Continue on Singulair  10mg  At bedtime  .  Albuterol  inhaler As needed  Wheezing .  Continue on Allegra 180mg  daily .  Flonase  nasal daily  Astelin  nasal 2 puffs At bedtime   Saline nasal rinses Twice daily   Saline nasal gel At bedtime   Activity as tolerated.  Continue on CPAP At bedtime   Work on healthy weight.  Follow up in 3 months with Dr. Geronimo or Disha Cottam NP and As needed .  Please contact office for sooner follow up if symptoms do not improve or worsen or seek emergency care

## 2024-05-11 NOTE — Progress Notes (Signed)
 @Patient  ID: Carol Gilbert, female    DOB: [birth year], 73 y.o.   MRN: [id number]  Chief Complaint  Patient presents with   Asthma    Breathing problems,nasal drainage,shortness of breath last 4 days have improved    Referring provider: Geronimo Amel, MD  HPI: 73 year old female never smoker followed for moderate persistent asthma, chronic rhinitis and obstructive sleep apnea, lung nodules Medical history significant for morbid obesity status post bariatric surgery in 2019 Patient is from [place]    TEST/EVENTS :  FENO 08/2018 -10 ppb  IgE 09/19/18  -76 Eosinophils 100  HST 10/25/18 - AHI 6.8, SpO2 low 87%    serial CT chest.  CT chest May 20, 2020 showed pulmonary nodules that were stable to decreased in size consistent with a benign etiology  05/11/2024 Follow up ;  Discussed the use of AI scribe software for clinical note transcription with the patient, who gave verbal consent to proceed.  History of Present Illness Carol Gilbert is a 73 year old female with asthma and sleep apnea who presents for a two-month follow-up for asthma management.  She has experienced some improvement in her asthma symptoms over the last four days, although she continues to have wheezing and difficulty fully expanding her lungs.  Her breathing issues have improved but are not completely resolved,  Pulmonary function testing today shows moderate airflow obstruction with FEV1 at 75%, ratio 70, FVC 80%, positive bronchodilator response, significant mid flow obstruction with reversibility, DLCO 110%.  S Her current asthma medications include Symbicort  and albuterol . She is using the last of her Symbicort . She also takes Singulair  daily and uses an allergy medication similar to Zyrtec.  She has a history of sleep apnea and uses a CPAP machine regularly. She also experiences chronic rhinitis and uses nasal sprays like Flonase  and Astelin  as needed, although she finds them  drying. She alternates these with saline spray.  She is concerned about her upcoming trip to [place] as she has experienced worsening symptoms during previous visits. She has taken steps to minimize exposure to allergens at home, such as avoiding rugs.    Allergies  Allergen Reactions   Shellfish Allergy Anaphylaxis   Gluten Meal Other (See Comments)   Pollen Extract Other (See Comments)    Dust and smoke/ causes watery eyes; sneezing    Immunization History  Administered Date(s) Administered   Fluad Quad(high Dose 65+) 05/22/2019, 05/27/2021   INFLUENZA, HIGH DOSE SEASONAL PF 05/06/2018, 05/22/2019, 05/22/2020   Influenza, Seasonal, Injecte, Preservative Fre 05/18/2017   Influenza-Unspecified 05/01/2022   PFIZER(Purple Top)SARS-COV-2 Vaccination 11/05/2019, 12/05/2019, 06/07/2020, 12/21/2020   Pfizer Covid-19 Vaccine Bivalent Booster 31yrs & up 07/01/2021, 01/03/2022   Pfizer(Comirnaty)Fall Seasonal Vaccine 12 years and older 05/24/2022, 06/08/2023   Pneumococcal Conjugate-13 08/25/2018   Pneumococcal Polysaccharide-23 08/02/2017   Td 03/07/2019, 04/01/2019   Tdap 05/28/2020   Zoster Recombinant(Shingrix) 03/05/2019, 05/06/2019    Past Medical History:  Diagnosis Date   Apnea    Arthritis    Asthma    CAD (coronary artery disease), native coronary artery    Coronary CTA showed a coronary calcium  score of 9 with minimal less than 25% nonobstructive plaque in the proximal RCA, 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   Diabetes mellitus without complication (HCC)    no medications   Diverticulitis    Fibromyalgia    Hyperlipidemia with target LDL less than 70    Hypertension  Tobacco History: Social History   Tobacco Use  Smoking Status Never   Passive exposure: Past  Smokeless Tobacco Never   Counseling given: Not Answered   Outpatient Medications Prior to Visit  Medication Sig Dispense Refill   albuterol  (PROVENTIL )  (2.5 MG/3ML) 0.083% nebulizer solution Take 3 mLs (2.5 mg total) by nebulization every 4 (four) hours as needed. 180 mL 5   amLODipine (NORVASC) 5 MG tablet Take 5 mg by mouth daily.     Ascorbic Acid (VITAMIN C) 1000 MG tablet Take 1,000 mg by mouth daily.     azelastine  (ASTELIN ) 0.1 % nasal spray Place 2 sprays into both nostrils 2 (two) times daily as needed for rhinitis. Use in each nostril as directed 30 mL 5   b complex vitamins tablet Take 1 tablet by mouth every evening.      cetirizine (ZYRTEC) 10 MG tablet Take 10 mg by mouth daily.     Cholecalciferol (VITAMIN D3) 125 MCG (5000 UT) CAPS Take 5,000 Units by mouth daily.      docusate sodium  (COLACE) 100 MG capsule Take 300 mg by mouth at bedtime.     DULoxetine  (CYMBALTA ) 60 MG capsule Take 60 mg by mouth daily.     levalbuterol  (XOPENEX  HFA) 45 MCG/ACT inhaler Inhale 1-2 puffs into the lungs every 4 (four) hours as needed for wheezing. 45 each 3   losartan-hydrochlorothiazide (HYZAAR) 50-12.5 MG tablet Take 1 tablet by mouth daily.     meloxicam (MOBIC) 15 MG tablet Take 15 mg by mouth as needed for pain.     montelukast  (SINGULAIR ) 10 MG tablet Take 10 mg by mouth daily.      MUCINEX  MAXIMUM STRENGTH 1200 MG TB12 Take 1,200 mg by mouth every evening. Takes every other day in the evening     simvastatin  (ZOCOR ) 20 MG tablet Take 1 tablet (20 mg total) by mouth daily. 90 tablet 3   SYMBICORT  160-4.5 MCG/ACT inhaler Inhale 2 puffs into the lungs 2 (two) times daily. 1 each 5   Calcium -Phosphorus -Vitamin D  (CITRACAL +D3 PO) Take 1 tablet by mouth every evening.     Multiple Vitamin (MULTIVITAMIN WITH MINERALS) TABS tablet Take 1 tablet by mouth daily. One-A-Day Women's (Patient not taking: Reported on 05/11/2024)     SPIRIVA RESPIMAT 2.5 MCG/ACT AERS Inhale 2 puffs into the lungs daily. (Patient not taking: Reported on 05/11/2024)     No facility-administered medications prior to visit.     Review of Systems:   Constitutional:   No   weight loss, night sweats,  Fevers, chills, +fatigue, or  lassitude.  HEENT:   No headaches,  Difficulty swallowing,  Tooth/dental problems, or  Sore throat,                No sneezing, itching, ear ache, +nasal congestion, post nasal drip,   CV:  No chest pain,  Orthopnea, PND, swelling in lower extremities, anasarca, dizziness, palpitations, syncope.   GI  No heartburn, indigestion, abdominal pain, nausea, vomiting, diarrhea, change in bowel habits, loss of appetite, bloody stools.   Resp:  No chest wall deformity  Skin: no rash or lesions.  GU: no dysuria, change in color of urine, no urgency or frequency.  No flank pain, no hematuria   MS:  No joint pain or swelling.  No decreased range of motion.  No back pain.    Physical Exam  BP 108/72   Pulse 87   Temp 98.1 F (36.7 C) (Oral)   Ht 5'  1 (1.549 m)   Wt 240 lb 9.6 oz (109.1 kg)   SpO2 94%   BMI 45.46 kg/m   GEN: A/Ox3; pleasant , NAD, well nourished    HEENT:  [place]/AT,  NOSE-clear, THROAT-clear, no lesions, no postnasal drip or exudate noted.   NECK:  Supple w/ fair ROM; no JVD; normal carotid impulses w/o bruits; no thyromegaly or nodules palpated; no lymphadenopathy.    RESP  Clear  P & A; w/o, wheezes/ rales/ or rhonchi. no accessory muscle use, no dullness to percussion  CARD:  RRR, no m/r/g, no peripheral edema, pulses intact, no cyanosis or clubbing.  GI:   Soft & nt; nml bowel sounds; no organomegaly or masses detected.   Musco: Warm bil, no deformities or joint swelling noted.   Neuro: alert, no focal deficits noted.    Skin: Warm, no lesions or rashes    Lab Results:    BNP No results found for: BNP   Imaging: No results found.  Administration History     None          Latest Ref Rng & Units 05/11/2024   12:57 PM 11/01/2018    8:45 AM  PFT Results  FVC-Pre L 1.86  P 2.36   FVC-Predicted Pre % 71  P 85   FVC-Post L 2.11  P 2.38   FVC-Predicted Post % 80  P 85   Pre FEV1/FVC % %  67  P 73   Post FEV1/FCV % % 70  P 70   FEV1-Pre L 1.24  P 1.72   FEV1-Predicted Pre % 63  P 81   FEV1-Post L 1.48  P 1.67   DLCO uncorrected ml/min/mmHg 19.25  P 24.98   DLCO UNC% % 110  P 140   DLCO corrected ml/min/mmHg  24.83   DLCO COR %Predicted %  139   DLVA Predicted % 129  P 135   TLC L 4.97  P   TLC % Predicted % 108  P   RV % Predicted % 146  P     P Preliminary result    Lab Results  Component Value Date   NITRICOXIDE 10 09/26/2018        Assessment & Plan:   No problem-specific Assessment & Plan notes found for this encounter.  Assessment and Plan Assessment & Plan Asthma  -moderate persistent.  PFTs show slight decline in function.  She continues to have some ongoing symptom burden with wheezing and shortness of breath.She is allergic to dust and molds. Discussed the need for stronger medication and potential future use of injectable medications if symptoms persist. Discontinue Symbicort  and initiate Trelegy, providing a sample and sending a prescription to [facility] for a 90-day supply. Continue albuterol  as needed and maximize allergy medications. Consider biologic injectable asthma medications if symptoms persist after Trelegy.  Chronic allergic rhinitis   She has chronic allergic rhinitis with symptoms of phlegm and mucous.s. Use Flonase  and Astelin  nasal sprays daily, alternating them, and add saline spray and gel.  Obstructive sleep apnea   Her obstructive sleep apnea is managed with CPAP, and she is compliant with its use.  Travel-related respiratory concerns   She has concerns about potential respiratory issues during upcoming travel to [place] as previous trips have resulted in respiratory issues. Advise wearing a mask on the plane, practicing good hand hygiene, and avoiding eating on the plane to minimize exposure. Administer flu shot today to provide protection before travel.  Lung nodules stable on  serial CT   Plan  Patient Instructions  Flu  shot today.  Stop Symbicort  .  Begin Trelegy 1 puff daily, rinse after use.   Continue on Singulair  10mg  At bedtime  .  Albuterol  inhaler As needed  Wheezing .  Continue on Allegra 180mg  daily .  Flonase  nasal daily  Astelin  nasal 2 puffs At bedtime   Saline nasal rinses Twice daily   Saline nasal gel At bedtime   Activity as tolerated.  Continue on CPAP At bedtime   Work on healthy weight.  Follow up in 3 months with Dr. Geronimo or Secily Walthour NP and As needed .  Please contact office for sooner follow up if symptoms do not improve or worsen or seek emergency care      Madelin Stank, NP 05/11/2024

## 2024-05-11 NOTE — Patient Instructions (Signed)
 Full pft performed today

## 2024-05-29 ENCOUNTER — Ambulatory Visit (HOSPITAL_BASED_OUTPATIENT_CLINIC_OR_DEPARTMENT_OTHER)
Admission: RE | Admit: 2024-05-29 | Discharge: 2024-05-29 | Disposition: A | Discharge status: Home / Self Care | Source: Ambulatory Visit

## 2024-05-29 ENCOUNTER — Encounter (HOSPITAL_BASED_OUTPATIENT_CLINIC_OR_DEPARTMENT_OTHER): Payer: Self-pay

## 2024-05-29 DIAGNOSIS — Z1231 Encounter for screening mammogram for malignant neoplasm of breast: Secondary | ICD-10-CM | POA: Diagnosis present

## 2024-06-04 NOTE — Progress Notes (Signed)
 Clinical team  Echo is normal but they are saying there might might be a small PFO  Plan    - pls order a bUBBLE ECHO study net few to several weeks

## 2024-06-13 ENCOUNTER — Other Ambulatory Visit: Payer: Self-pay

## 2024-06-13 DIAGNOSIS — R928 Other abnormal and inconclusive findings on diagnostic imaging of breast: Secondary | ICD-10-CM

## 2024-06-14 NOTE — Progress Notes (Signed)
 ATCx1 with interpretor [place], [id number]). Pt answered phone but did not say anything, was unable to LVM. Echo placed.

## 2024-06-16 NOTE — Progress Notes (Signed)
 ATCx2 with interpretor [place], [id number]). Interpretor states that when he dialed the number there was no tone indicating that the call was going through. We tried to call the number twice, unable to LVM.

## 2024-06-16 NOTE — Progress Notes (Signed)
 Mailed letter, as unable to reach patient. NFN

## 2024-07-07 ENCOUNTER — Ambulatory Visit
Admission: RE | Admit: 2024-07-07 | Discharge: 2024-07-07 | Disposition: A | Discharge status: Home / Self Care | Source: Ambulatory Visit

## 2024-07-07 ENCOUNTER — Ambulatory Visit

## 2024-07-07 DIAGNOSIS — R928 Other abnormal and inconclusive findings on diagnostic imaging of breast: Secondary | ICD-10-CM

## 2024-07-17 ENCOUNTER — Ambulatory Visit (HOSPITAL_COMMUNITY)
Admission: RE | Admit: 2024-07-17 | Discharge: 2024-07-17 | Disposition: A | Discharge status: Home / Self Care | Source: Ambulatory Visit | Attending: Surgery | Admitting: Surgery

## 2024-07-17 DIAGNOSIS — Q2112 Patent foramen ovale: Secondary | ICD-10-CM | POA: Diagnosis present

## 2024-07-17 LAB — ECHOCARDIOGRAM LIMITED BUBBLE STUDY
AR max vel: 2.98 cm2
AV Area VTI: 2.71 cm2
AV Area mean vel: 2.8 cm2
AV Mean grad: 5 mmHg
AV Peak grad: 9.4 mmHg
Ao pk vel: 1.53 m/s
Area-P 1/2: 3.27 cm2
S' Lateral: 2.53 cm

## 2024-07-18 ENCOUNTER — Ambulatory Visit

## 2024-07-18 ENCOUNTER — Telehealth: Payer: Self-pay

## 2024-07-18 DIAGNOSIS — J4540 Moderate persistent asthma, uncomplicated: Secondary | ICD-10-CM

## 2024-07-18 NOTE — Telephone Encounter (Signed)
 Copied from CRM [id number]. Topic: Clinical - Request for Lab/Test Order >> Jul 18, 2024 10:51 AM Celestine FALCON wrote: Reason for CRM: Pt received a reminder about her DG Chest 2 View (Order [id number]) With an expiration date of 07/18/2024. Pt stated she would be in [place] on until 08/02/2024, and could come in and complete after she returns. Pt would need a new order placed in her chart.   New order placed. ATC x1.  Left detailed vm stating new order has been placed.

## 2024-07-20 ENCOUNTER — Ambulatory Visit: Payer: Self-pay | Admitting: Internal Medicine

## 2024-07-20 NOTE — Progress Notes (Signed)
 Normal echo but for mild heart muscle stiffnes. NO shunt

## 2024-07-21 ENCOUNTER — Other Ambulatory Visit: Payer: Self-pay | Admitting: Internal Medicine

## 2024-08-06 ENCOUNTER — Other Ambulatory Visit: Payer: Self-pay | Admitting: Cardiology

## 2024-08-07 ENCOUNTER — Ambulatory Visit

## 2024-08-07 DIAGNOSIS — J4540 Moderate persistent asthma, uncomplicated: Secondary | ICD-10-CM

## 2024-08-10 ENCOUNTER — Ambulatory Visit: Admitting: Adult Health

## 2024-08-10 ENCOUNTER — Encounter: Payer: Self-pay | Admitting: Adult Health

## 2024-08-10 VITALS — BP 112/73 | HR 77 | Temp 97.8°F | Ht 61.0 in | Wt 237.4 lb

## 2024-08-10 DIAGNOSIS — G4733 Obstructive sleep apnea (adult) (pediatric): Secondary | ICD-10-CM | POA: Diagnosis not present

## 2024-08-10 DIAGNOSIS — J4540 Moderate persistent asthma, uncomplicated: Secondary | ICD-10-CM | POA: Diagnosis not present

## 2024-08-10 DIAGNOSIS — J310 Chronic rhinitis: Secondary | ICD-10-CM | POA: Diagnosis not present

## 2024-08-10 DIAGNOSIS — R918 Other nonspecific abnormal finding of lung field: Secondary | ICD-10-CM

## 2024-08-10 DIAGNOSIS — R0609 Other forms of dyspnea: Secondary | ICD-10-CM

## 2024-08-10 NOTE — Patient Instructions (Addendum)
 Trelegy 1 puff daily, rinse after use.   Continue on Singulair  10mg  At bedtime  .  Albuterol  inhaler As needed  Wheezing .  Continue on Allegra 180mg  daily .  Flonase  nasal daily  Astelin  nasal 2 puffs At bedtime   Saline nasal rinses Twice daily   Saline nasal gel At bedtime   Activity as tolerated.  Continue on CPAP At bedtime   Work on healthy weight.  Follow up in 6 months with Dr. Geronimo or Xin Klawitter NP and As needed .

## 2024-08-10 NOTE — Progress Notes (Unsigned)
 @Patient  ID: Carol Gilbert, female    DOB: 25-Dec-1950, 73 y.o.   MRN: [id number]  Chief Complaint  Patient presents with   Medical Management of Chronic Issues    OSA and asthma    Referring provider: Gladystine Erminio CROME, MD  HPI: 73 year old female never smoker followed for moderate persistent asthma, chronic rhinitis, obstructive sleep apnea and pulmonary nodules (stable on serial imaging) Medical history significant for morbid obesity status post bariatric surgery in 2019 Patient is from [place]    TEST/EVENTS : Reviewed 08/10/2024  FENO 08/2018 -10 ppb  IgE 09/19/18  -76 Eosinophils 100  HST 10/25/18 - AHI 6.8, SpO2 low 87%    serial CT chest.  CT chest May 20, 2020 showed pulmonary nodules that were stable to decreased in size consistent with a benign etiology  05/11/24 Pulmonary function testing shows moderate airflow obstruction with FEV1 at 75%, ratio 70, FVC 80%, positive bronchodilator response, significant mid flow obstruction with reversibility, DLCO 110%.   Discussed the use of AI scribe software for clinical note transcription with the patient, who gave verbal consent to proceed.  History of Present Illness Carol Gilbert is a 73 year old female with asthma who presents for a checkup.  She switched to a Trelegy inhaler three months ago, which she finds more effective than her previous inhaler. Despite this improvement, she occasionally experiences difficulty in fully expanding her lungs, describing a sensation akin to her bra restricting her breathing, although removing it does not alleviate the issue. This sensation is intermittent. She also experiences shortness of breath when bending over for extended periods.  Her current asthma management includes the use of Trelegy inhaler once daily and albuterol  inhaler as needed. She is also on an allergy regimen with Allegra and Flonase , which she reports has been effective in managing her  post-nasal drip.  She uses a CPAP machine for sleep apnea and reports good control with a nasal mask. There have been a few nights where she did not use the CPAP, possibly due to being out of town, but overall, her sleep apnea is well-managed.  She had a pulmonary function testing in September that showed moderate airflow obstruction with significant bronchodilator response.  CT chest October 29, 2023 showed stable bilateral pulmonary nodules dating back to 2021 likely benign etiology stable scarring in the right lung  echocardiogram, showed preserved EF 60 to 65%, grade 1 diastolic dysfunction, normal pulmonary artery systolic pressure, bubble study was negative.      Allergies[1]  Immunization History  Administered Date(s) Administered   Fluad Quad(high Dose 65+) 05/22/2019, 05/27/2021   INFLUENZA, HIGH DOSE SEASONAL PF 05/06/2018, 05/22/2019, 05/22/2020, 05/11/2024   Influenza, Seasonal, Injecte, Preservative Fre 05/18/2017   Influenza-Unspecified 05/01/2022   PFIZER(Purple Top)SARS-COV-2 Vaccination 11/05/2019, 12/05/2019, 06/07/2020, 12/21/2020   Pfizer Covid-19 Vaccine Bivalent Booster 48yrs & up 07/01/2021, 01/03/2022   Pfizer(Comirnaty)Fall Seasonal Vaccine 12 years and older 05/24/2022, 06/08/2023   Pneumococcal Conjugate-13 08/25/2018   Pneumococcal Polysaccharide-23 08/02/2017   Td 03/07/2019, 04/01/2019   Tdap 05/28/2020   Zoster Recombinant(Shingrix) 03/05/2019, 05/06/2019    Past Medical History:  Diagnosis Date   Apnea    Arthritis    Asthma    CAD (coronary artery disease), native coronary artery    Coronary CTA showed a coronary calcium  score of 9 with minimal less than 25% nonobstructive plaque in the proximal RCA, 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   Diabetes mellitus  without complication (HCC)    no medications   Diverticulitis    Fibromyalgia    Hyperlipidemia with target LDL less than 70    Hypertension      Tobacco History: Tobacco Use History[2] Counseling given: Not Answered   Outpatient Medications Prior to Visit  Medication Sig Dispense Refill   albuterol  (PROVENTIL ) (2.5 MG/3ML) 0.083% nebulizer solution Take 3 mLs (2.5 mg total) by nebulization every 4 (four) hours as needed. 180 mL 5   amLODipine (NORVASC) 5 MG tablet Take 5 mg by mouth daily.     Ascorbic Acid (VITAMIN C) 1000 MG tablet Take 1,000 mg by mouth daily.     azelastine  (ASTELIN ) 0.1 % nasal spray Place 2 sprays into both nostrils 2 (two) times daily as needed for rhinitis. Use in each nostril as directed 30 mL 5   b complex vitamins tablet Take 1 tablet by mouth every evening.      cetirizine (ZYRTEC) 10 MG tablet Take 10 mg by mouth daily.     Cholecalciferol (VITAMIN D3) 125 MCG (5000 UT) CAPS Take 5,000 Units by mouth daily.      Ciclopirox (CICLOPIROX TREATMENT) 8 % KIT APPLY TOPICALLY AT BEDTIME ONTO NAIL AND SURROUNDING SKIN OVER PREVIOUS COAT AFTER 7 DAYS REMOVE WITH ALCOHOL AND REPEAT     cyclobenzaprine (FLEXERIL) 10 MG tablet Take 10 mg by mouth at bedtime as needed.     diclofenac  (CATAFLAM) 50 MG tablet Take 50 mg by mouth daily as needed.     docusate sodium  (COLACE) 100 MG capsule Take 300 mg by mouth at bedtime.     DULoxetine  (CYMBALTA ) 60 MG capsule Take 60 mg by mouth daily.     Fluticasone -Umeclidin-Vilant (TRELEGY ELLIPTA ) 200-62.5-25 MCG/ACT AEPB Inhale 1 puff into the lungs daily at 2 PM.     levalbuterol  (XOPENEX  HFA) 45 MCG/ACT inhaler INHALE 1-2 PUFFS INTO THE LUNGS EVERY 4 (FOUR) HOURS AS NEEDED FOR WHEEZING. 45 g 0   losartan-hydrochlorothiazide (HYZAAR) 50-12.5 MG tablet Take 1 tablet by mouth daily.     meloxicam (MOBIC) 15 MG tablet Take 15 mg by mouth as needed for pain.     montelukast  (SINGULAIR ) 10 MG tablet Take 10 mg by mouth daily.      MUCINEX  MAXIMUM STRENGTH 1200 MG TB12 Take 1,200 mg by mouth every evening. Takes every other day in the evening     SEMAGLUTIDE-WEIGHT MANAGEMENT  [place] Inject into the skin.     simvastatin  (ZOCOR ) 20 MG tablet TAKE 1 TABLET EVERY DAY 90 tablet 0   traMADol (ULTRAM) 50 MG tablet Take 50 mg by mouth every 12 (twelve) hours as needed.     ondansetron  (ZOFRAN -ODT) 4 MG disintegrating tablet Take 4 mg by mouth every 8 (eight) hours as needed. (Patient not taking: Reported on 08/10/2024)     Tiotropium Bromide (SPIRIVA RESPIMAT) 2.5 MCG/ACT AERS Inhale 2 puffs into the lungs daily. (Patient not taking: Reported on 08/10/2024)     Fluticasone -Umeclidin-Vilant (TRELEGY ELLIPTA ) 200-62.5-25 MCG/ACT AEPB Inhale 1 puff into the lungs daily. 3 each 3   No facility-administered medications prior to visit.     Review of Systems:   Constitutional:   No  weight loss, night sweats,  Fevers, chills, fatigue, or  lassitude.  HEENT:   No headaches,  Difficulty swallowing,  Tooth/dental problems, or  Sore throat,                No sneezing, itching, ear ache, nasal congestion, post nasal drip,  CV:  No chest pain,  Orthopnea, PND, swelling in lower extremities, anasarca, dizziness, palpitations, syncope.   GI  No heartburn, indigestion, abdominal pain, nausea, vomiting, diarrhea, change in bowel habits, loss of appetite, bloody stools.   Resp: No shortness of breath with exertion or at rest.  No excess mucus, no productive cough,  No non-productive cough,  No coughing up of blood.  No change in color of mucus.  No wheezing.  No chest wall deformity  Skin: no rash or lesions.  GU: no dysuria, change in color of urine, no urgency or frequency.  No flank pain, no hematuria   MS:  No joint pain or swelling.  No decreased range of motion.  + back pain.    Physical Exam  Today's Vitals   08/10/24 0957  BP: 112/73  Pulse: 77  Temp: 97.8 F (36.6 C)  SpO2: 96%  Weight: 237 lb 6.4 oz (107.7 kg)  Height: 5' 1 (1.549 m)   Body mass index is 44.86 kg/m.   GEN: A/Ox3; pleasant , NAD, well nourished    HEENT:  [place]/AT, NOSE-clear, THROAT-clear,  no lesions, no postnasal drip or exudate noted.   NECK:  Supple w/ fair ROM; no JVD; normal carotid impulses w/o bruits; no thyromegaly or nodules palpated; no lymphadenopathy.    RESP  Clear  P & A; w/o, wheezes/ rales/ or rhonchi. no accessory muscle use, no dullness to percussion  CARD:  RRR, no m/r/g, no peripheral edema, pulses intact, no cyanosis or clubbing.  GI:   Soft & nt; nml bowel sounds; no organomegaly or masses detected.   Musco: Warm bil, no deformities or joint swelling noted.   Neuro: alert, no focal deficits noted.    Skin: Warm, no lesions or rashes    Lab Results:Reviewed 08/10/2024   CBC    Component Value Date/Time   WBC 7.6 02/29/2024 1630   RBC 4.51 02/29/2024 1630   HGB 13.2 02/29/2024 1630   HCT 39.4 02/29/2024 1630   PLT 304.0 02/29/2024 1630   MCV 87.4 02/29/2024 1630   MCH 29.7 12/19/2021 1405   MCHC 33.5 02/29/2024 1630   RDW 14.3 02/29/2024 1630   LYMPHSABS 1.9 02/29/2024 1630   MONOABS 0.4 02/29/2024 1630   EOSABS 0.1 02/29/2024 1630   BASOSABS 0.1 02/29/2024 1630    BMET    Component Value Date/Time   NA 143 09/03/2023 0854   K 3.9 09/03/2023 0854   CL 104 09/03/2023 0854   CO2 25 09/03/2023 0854   GLUCOSE 107 (H) 09/03/2023 0854   GLUCOSE 113 (H) 12/19/2021 1405   BUN 22 09/03/2023 0854   CREATININE 0.48 (L) 09/03/2023 0854   CALCIUM  10.5 (H) 09/03/2023 0854   GFRNONAA >60 12/19/2021 1405   GFRAA >60 10/13/2019 1412    BNP No results found for: BNP  ProBNP    Component Value Date/Time   PROBNP 48.0 02/29/2024 1630    Imaging: ECHOCARDIOGRAM LIMITED BUBBLE STUDY Result Date: 07/17/2024    ECHOCARDIOGRAM LIMITED REPORT   Patient Name:   Carol Gilbert Date of Exam: 07/17/2024 Medical Rec #:  [id number]              Height:       61.0 in Accession #:    [id number]             Weight:       240.6 lb Date of Birth:  04/26/1951  BSA:          2.043 m Patient Age:    72 years               BP:            108/72 mmHg Patient Gender: F                      HR:           76 bpm. Exam Location:  [address] Procedure: Limited Echo, Saline Contrast Bubble Study, Limited Color Doppler,            Cardiac Doppler and 2D Echo (Both Spectral and Color Flow Doppler            were utilized during procedure). Indications:    R06.02 SOB  History:        Patient has prior history of Echocardiogram examinations, most                 recent 04/11/2024. CAD, Signs/Symptoms:Shortness of Breath; Risk                 Factors:Dyslipidemia, Sleep Apnea, Diabetes, Hypertension and                 Non-Smoker. Patient denies chest pain and leg edema. She does                 have SOB without any exertion. Saline bubble study requested.  Sonographer:    Carol Cater RVT, RDCS (AE), RDMS Referring Phys: [id number] MURALI RAMASWAMY IMPRESSIONS  1. Left ventricular ejection fraction, by estimation, is 60 to 65%. The left ventricle has normal function. The left ventricle has no regional wall motion abnormalities. Left ventricular diastolic parameters are consistent with Grade I diastolic dysfunction (impaired relaxation).  2. Right ventricular systolic function is normal. The right ventricular size is normal. There is normal pulmonary artery systolic pressure.  3. The mitral valve is normal in structure. No evidence of mitral valve regurgitation. No evidence of mitral stenosis.  4. The aortic valve is tricuspid. Aortic valve regurgitation is not visualized. No aortic stenosis is present.  5. Aortic dilatation noted. There is mild dilatation of the ascending aorta, measuring 43 mm.  6. The inferior vena cava is normal in size with greater than 50% respiratory variability, suggesting right atrial pressure of 3 mmHg.  7. Agitated saline contrast bubble study was negative, with no evidence of any interatrial shunt. Comparison(s): EF 60% asc AOR 44 mm, question PFO. FINDINGS  Left Ventricle: Left ventricular ejection fraction, by estimation, is 60 to  65%. The left ventricle has normal function. The left ventricle has no regional wall motion abnormalities. The left ventricular internal cavity size was normal in size. There is  no left ventricular hypertrophy. Left ventricular diastolic parameters are consistent with Grade I diastolic dysfunction (impaired relaxation). Right Ventricle: The right ventricular size is normal. No increase in right ventricular wall thickness. Right ventricular systolic function is normal. There is normal pulmonary artery systolic pressure. The tricuspid regurgitant velocity is 2.73 m/s, and  with an assumed right atrial pressure of 3 mmHg, the estimated right ventricular systolic pressure is 32.8 mmHg. Left Atrium: Left atrial size was normal in size. Right Atrium: Right atrial size was normal in size. Pericardium: There is no evidence of pericardial effusion. Mitral Valve: The mitral valve is normal in structure. No evidence of mitral valve stenosis. Tricuspid Valve: The tricuspid valve is normal in structure.  Tricuspid valve regurgitation is trivial. No evidence of tricuspid stenosis. Aortic Valve: The aortic valve is tricuspid. Aortic valve regurgitation is not visualized. No aortic stenosis is present. Aortic valve mean gradient measures 5.0 mmHg. Aortic valve peak gradient measures 9.4 mmHg. Aortic valve area, by VTI measures 2.71 cm. Pulmonic Valve: The pulmonic valve was normal in structure. Pulmonic valve regurgitation is not visualized. No evidence of pulmonic stenosis. Aorta: Aortic dilatation noted. There is mild dilatation of the ascending aorta, measuring 43 mm. Venous: The inferior vena cava is normal in size with greater than 50% respiratory variability, suggesting right atrial pressure of 3 mmHg. IAS/Shunts: No atrial level shunt detected by color flow Doppler. Agitated saline contrast was given intravenously to evaluate for intracardiac shunting. Agitated saline contrast bubble study was negative, with no evidence of  any interatrial shunt. LEFT VENTRICLE PLAX 2D LVIDd:         4.33 cm LVIDs:         2.53 cm LV PW:         1.05 cm LV IVS:        0.90 cm LVOT diam:     2.00 cm LV SV:         86 LV SV Index:   42 LVOT Area:     3.14 cm  LEFT ATRIUM             Index        RIGHT ATRIUM           Index LA diam:        3.36 cm 1.64 cm/m   RA Area:     10.50 cm LA Vol (A2C):   52.0 ml 25.45 ml/m  RA Volume:   17.10 ml  8.37 ml/m LA Vol (A4C):   40.5 ml 19.82 ml/m LA Biplane Vol: 46.6 ml 22.81 ml/m  AORTIC VALVE                     PULMONIC VALVE AV Area (Vmax):    2.98 cm      PV Vmax:       1.27 m/s AV Area (Vmean):   2.80 cm      PV Peak grad:  6.5 mmHg AV Area (VTI):     2.71 cm AV Vmax:           153.00 cm/s AV Vmean:          102.000 cm/s AV VTI:            0.316 m AV Peak Grad:      9.4 mmHg AV Mean Grad:      5.0 mmHg LVOT Vmax:         145.00 cm/s LVOT Vmean:        90.800 cm/s LVOT VTI:          0.273 m LVOT/AV VTI ratio: 0.86  AORTA Ao Root diam: 3.80 cm Ao Asc diam:  4.30 cm Ao Arch diam: 3.6 cm MITRAL VALVE                TRICUSPID VALVE MV Area (PHT): 3.27 cm     TR Peak grad:   29.8 mmHg MV Decel Time: 232 msec     TR Vmax:        273.00 cm/s MV E velocity: 82.30 cm/s MV A velocity: 117.00 cm/s  SHUNTS MV E/A ratio:  0.70         Systemic VTI:  0.27 m  Systemic Diam: 2.00 cm Carol Scarce MD Electronically signed by Carol Scarce MD Signature Date/Time: 07/17/2024/2:30:52 PM    Final     Administration History     None          Latest Ref Rng & Units 05/11/2024   12:57 PM 11/01/2018    8:45 AM  PFT Results  FVC-Pre L 1.86  2.36   FVC-Predicted Pre % 71  85   FVC-Post L 2.11  2.38   FVC-Predicted Post % 80  85   Pre FEV1/FVC % % 67  73   Post FEV1/FCV % % 70  70   FEV1-Pre L 1.24  1.72   FEV1-Predicted Pre % 63  81   FEV1-Post L 1.48  1.67   DLCO uncorrected ml/min/mmHg 19.25  24.98   DLCO UNC% % 110  140   DLCO corrected ml/min/mmHg  24.83   DLCO COR  %Predicted %  139   DLVA Predicted % 129  135   TLC L 4.97    TLC % Predicted % 108    RV % Predicted % 146      Lab Results  Component Value Date   NITRICOXIDE 10 09/26/2018       10/06/2018   11:00 AM  Results of the [facility] flowsheet  Sitting and reading 0  Watching TV 0  Sitting, inactive in a public place (e.g. a theatre or a meeting) 0  As a passenger in a car for an hour without a break 1  Lying down to rest in the afternoon when circumstances permit 3  Sitting and talking to someone 0  Sitting quietly after a lunch without alcohol 0  In a car, while stopped for a few minutes in traffic 0  Total score 4        Assessment & Plan:   Assessment and Plan Assessment & Plan Moderate persistent asthma  -improved control on triple therapy maintenance inhaler. Asthma is well-controlled with Trelegy,. Occasional dyspnea occurs when bending over, likely due to restrictive factors from weight on the chest wall and abdomen. Previous pulmonary function tests showed significant airflow obstruction with some restriction. Continue Trelegy once daily. Brush, rinse, and gargle after using Trelegy. Use albuterol  inhaler as needed. Maximize allergy regimen with Allegra and Flonase .  Obstructive sleep apnea   Well-controlled with CPAP therapy, though there is occasional non-use due to travel or other reasons. The nasal mask is effective. Continue CPAP therapy with the nasal mask. Use distilled water  in the CPAP water  chamber and rinse regularly.  Pulmonary nodules, stable   Pulmonary nodules have remained stable over the past four years, with some nodules decreasing in size. A recent CT scan showed no significant changes.Allergic rhinitis   Well-managed with nasal spray, effectively controlling post-nasal drip. Continue nasal spray for allergic rhinitis. Chest xray pending   Chronic rhinitis -controlled on current regimen   Plan  Patient Instructions  Trelegy 1 puff daily, rinse after  use.   Continue on Singulair  10mg  At bedtime  .  Albuterol  inhaler As needed  Wheezing .  Continue on Allegra 180mg  daily .  Flonase  nasal daily  Astelin  nasal 2 puffs At bedtime   Saline nasal rinses Twice daily   Saline nasal gel At bedtime   Activity as tolerated.  Continue on CPAP At bedtime   Work on healthy weight.  Follow up in 6 months with Dr. Geronimo or Quy Lotts NP and As needed .  Kasey Ewings, NP 08/10/2024     [1]  Allergies Allergen Reactions   Shellfish Allergy Anaphylaxis   Gluten Meal Other (See Comments)   Pollen Extract Other (See Comments)    Dust and smoke/ causes watery eyes; sneezing  [2]  Social History Tobacco Use  Smoking Status Never   Passive exposure: Past  Smokeless Tobacco Never

## 2024-08-28 ENCOUNTER — Encounter (INDEPENDENT_AMBULATORY_CARE_PROVIDER_SITE_OTHER): Payer: Self-pay

## 2024-08-28 ENCOUNTER — Other Ambulatory Visit: Payer: Self-pay | Admitting: Adult Health

## 2024-08-28 MED ORDER — TRELEGY ELLIPTA 200-62.5-25 MCG/ACT IN AEPB: 1.0000 | INHALATION_SPRAY | Freq: Every day | RESPIRATORY_TRACT | 3 refills | Status: AC

## 2025-02-08 ENCOUNTER — Ambulatory Visit: Admitting: Adult Health
